# Patient Record
Sex: Male | Born: 1965 | Race: Black or African American | Hispanic: No | Smoking: Never smoker
Health system: Southern US, Community
[De-identification: ages and names within clinical notes are randomized; demographics above are authoritative.]

## PROBLEM LIST (undated history)

## (undated) DIAGNOSIS — F32A Depression, unspecified: Secondary | ICD-10-CM

## (undated) DIAGNOSIS — M1990 Unspecified osteoarthritis, unspecified site: Secondary | ICD-10-CM

## (undated) DIAGNOSIS — E785 Hyperlipidemia, unspecified: Secondary | ICD-10-CM

## (undated) DIAGNOSIS — F0390 Unspecified dementia without behavioral disturbance: Secondary | ICD-10-CM

## (undated) DIAGNOSIS — I469 Cardiac arrest, cause unspecified: Secondary | ICD-10-CM

## (undated) DIAGNOSIS — I639 Cerebral infarction, unspecified: Secondary | ICD-10-CM

## (undated) DIAGNOSIS — M24542 Contracture, left hand: Secondary | ICD-10-CM

## (undated) DIAGNOSIS — I10 Essential (primary) hypertension: Secondary | ICD-10-CM

## (undated) DIAGNOSIS — F0150 Vascular dementia without behavioral disturbance: Secondary | ICD-10-CM

## (undated) DIAGNOSIS — F329 Major depressive disorder, single episode, unspecified: Secondary | ICD-10-CM

## (undated) DIAGNOSIS — R252 Cramp and spasm: Secondary | ICD-10-CM

## (undated) HISTORY — DX: Hyperlipidemia, unspecified: E78.5

## (undated) HISTORY — DX: Cardiac arrest, cause unspecified: I46.9

## (undated) HISTORY — DX: Depression, unspecified: F32.A

## (undated) HISTORY — DX: Cramp and spasm: R25.2

## (undated) HISTORY — DX: Unspecified osteoarthritis, unspecified site: M19.90

## (undated) HISTORY — PX: HERNIA REPAIR: SHX51

## (undated) HISTORY — DX: Vascular dementia without behavioral disturbance: F01.50

## (undated) HISTORY — DX: Major depressive disorder, single episode, unspecified: F32.9

## (undated) HISTORY — DX: Essential (primary) hypertension: I10

## (undated) HISTORY — DX: Contracture, left hand: M24.542

## (undated) HISTORY — DX: Cerebral infarction, unspecified: I63.9

---

## 1997-12-03 ENCOUNTER — Ambulatory Visit (HOSPITAL_COMMUNITY): Admission: RE | Admit: 1997-12-03 | Discharge: 1997-12-03 | Payer: Self-pay | Admitting: Family Medicine

## 1998-03-20 ENCOUNTER — Emergency Department (HOSPITAL_COMMUNITY): Admission: EM | Admit: 1998-03-20 | Discharge: 1998-03-20 | Payer: Self-pay | Admitting: Emergency Medicine

## 1998-08-14 ENCOUNTER — Encounter: Admission: RE | Admit: 1998-08-14 | Discharge: 1998-09-05 | Payer: Self-pay | Admitting: *Deleted

## 1999-05-29 ENCOUNTER — Emergency Department (HOSPITAL_COMMUNITY): Admission: EM | Admit: 1999-05-29 | Discharge: 1999-05-29 | Payer: Self-pay | Admitting: Emergency Medicine

## 1999-06-03 ENCOUNTER — Encounter: Admission: RE | Admit: 1999-06-03 | Discharge: 1999-06-03 | Payer: Self-pay | Admitting: Internal Medicine

## 2000-03-12 ENCOUNTER — Emergency Department (HOSPITAL_COMMUNITY): Admission: EM | Admit: 2000-03-12 | Discharge: 2000-03-12 | Payer: Self-pay | Admitting: Emergency Medicine

## 2000-11-12 ENCOUNTER — Emergency Department (HOSPITAL_COMMUNITY): Admission: EM | Admit: 2000-11-12 | Discharge: 2000-11-12 | Payer: Self-pay | Admitting: Emergency Medicine

## 2001-09-21 ENCOUNTER — Emergency Department (HOSPITAL_COMMUNITY): Admission: EM | Admit: 2001-09-21 | Discharge: 2001-09-21 | Payer: Self-pay | Admitting: Emergency Medicine

## 2001-10-31 ENCOUNTER — Emergency Department (HOSPITAL_COMMUNITY): Admission: EM | Admit: 2001-10-31 | Discharge: 2001-10-31 | Payer: Self-pay | Admitting: *Deleted

## 2001-11-08 ENCOUNTER — Encounter: Admission: RE | Admit: 2001-11-08 | Discharge: 2001-11-08 | Payer: Self-pay | Admitting: Internal Medicine

## 2001-11-22 ENCOUNTER — Encounter: Admission: RE | Admit: 2001-11-22 | Discharge: 2001-11-22 | Payer: Self-pay | Admitting: Internal Medicine

## 2002-05-02 ENCOUNTER — Encounter: Admission: RE | Admit: 2002-05-02 | Discharge: 2002-05-02 | Payer: Self-pay | Admitting: Internal Medicine

## 2002-08-02 ENCOUNTER — Ambulatory Visit (HOSPITAL_COMMUNITY): Admission: RE | Admit: 2002-08-02 | Discharge: 2002-08-02 | Payer: Self-pay | Admitting: Internal Medicine

## 2002-08-02 ENCOUNTER — Encounter: Admission: RE | Admit: 2002-08-02 | Discharge: 2002-08-02 | Payer: Self-pay | Admitting: Internal Medicine

## 2002-08-08 ENCOUNTER — Encounter: Admission: RE | Admit: 2002-08-08 | Discharge: 2002-08-08 | Payer: Self-pay | Admitting: Internal Medicine

## 2002-09-23 ENCOUNTER — Emergency Department (HOSPITAL_COMMUNITY): Admission: EM | Admit: 2002-09-23 | Discharge: 2002-09-23 | Payer: Self-pay | Admitting: Emergency Medicine

## 2002-09-28 ENCOUNTER — Encounter: Payer: Self-pay | Admitting: *Deleted

## 2002-09-28 ENCOUNTER — Emergency Department (HOSPITAL_COMMUNITY): Admission: EM | Admit: 2002-09-28 | Discharge: 2002-09-28 | Payer: Self-pay | Admitting: Emergency Medicine

## 2002-11-23 ENCOUNTER — Encounter: Admission: RE | Admit: 2002-11-23 | Discharge: 2002-11-23 | Payer: Self-pay | Admitting: Internal Medicine

## 2002-12-18 ENCOUNTER — Encounter: Admission: RE | Admit: 2002-12-18 | Discharge: 2002-12-18 | Payer: Self-pay | Admitting: Internal Medicine

## 2003-01-17 ENCOUNTER — Encounter: Admission: RE | Admit: 2003-01-17 | Discharge: 2003-01-17 | Payer: Self-pay | Admitting: Internal Medicine

## 2003-02-06 ENCOUNTER — Emergency Department (HOSPITAL_COMMUNITY): Admission: AD | Admit: 2003-02-06 | Discharge: 2003-02-06 | Payer: Self-pay | Admitting: Family Medicine

## 2003-02-08 ENCOUNTER — Emergency Department (HOSPITAL_COMMUNITY): Admission: AD | Admit: 2003-02-08 | Discharge: 2003-02-08 | Payer: Self-pay | Admitting: Family Medicine

## 2003-02-16 ENCOUNTER — Encounter: Admission: RE | Admit: 2003-02-16 | Discharge: 2003-02-16 | Payer: Self-pay | Admitting: Internal Medicine

## 2003-02-17 ENCOUNTER — Ambulatory Visit (HOSPITAL_COMMUNITY): Admission: RE | Admit: 2003-02-17 | Discharge: 2003-02-17 | Payer: Self-pay | Admitting: Internal Medicine

## 2003-03-15 ENCOUNTER — Encounter: Admission: RE | Admit: 2003-03-15 | Discharge: 2003-03-15 | Payer: Self-pay | Admitting: Internal Medicine

## 2003-04-11 ENCOUNTER — Encounter: Admission: RE | Admit: 2003-04-11 | Discharge: 2003-04-11 | Payer: Self-pay | Admitting: Internal Medicine

## 2003-04-23 ENCOUNTER — Encounter: Admission: RE | Admit: 2003-04-23 | Discharge: 2003-04-23 | Payer: Self-pay | Admitting: Neurosurgery

## 2003-04-25 ENCOUNTER — Encounter: Admission: RE | Admit: 2003-04-25 | Discharge: 2003-04-25 | Payer: Self-pay | Admitting: Internal Medicine

## 2003-05-08 ENCOUNTER — Encounter: Admission: RE | Admit: 2003-05-08 | Discharge: 2003-05-08 | Payer: Self-pay | Admitting: Neurosurgery

## 2004-10-23 ENCOUNTER — Ambulatory Visit: Payer: Self-pay | Admitting: Internal Medicine

## 2004-10-23 ENCOUNTER — Emergency Department (HOSPITAL_COMMUNITY): Admission: EM | Admit: 2004-10-23 | Discharge: 2004-10-23 | Payer: Self-pay | Admitting: Family Medicine

## 2006-05-12 ENCOUNTER — Telehealth (INDEPENDENT_AMBULATORY_CARE_PROVIDER_SITE_OTHER): Payer: Self-pay | Admitting: *Deleted

## 2007-09-05 ENCOUNTER — Emergency Department (HOSPITAL_COMMUNITY): Admission: EM | Admit: 2007-09-05 | Discharge: 2007-09-05 | Payer: Self-pay | Admitting: Family Medicine

## 2007-12-15 ENCOUNTER — Emergency Department (HOSPITAL_COMMUNITY): Admission: EM | Admit: 2007-12-15 | Discharge: 2007-12-15 | Payer: Self-pay | Admitting: Emergency Medicine

## 2010-12-04 LAB — POCT I-STAT, CHEM 8
BUN: 9
Calcium, Ion: 1.09 — ABNORMAL LOW
Chloride: 104
Creatinine, Ser: 1.6 — ABNORMAL HIGH
Glucose, Bld: 92
HCT: 46
Hemoglobin: 15.6
Potassium: 3.5
Sodium: 139
TCO2: 27

## 2016-07-01 HISTORY — PX: PEG TUBE PLACEMENT: SUR1034

## 2016-10-26 ENCOUNTER — Encounter: Payer: Self-pay | Admitting: Internal Medicine

## 2016-10-26 LAB — BASIC METABOLIC PANEL
BUN: 17 (ref 4–21)
Creatinine: 0.7 (ref 0.6–1.3)
Glucose: 100
Potassium: 4.4 (ref 3.4–5.3)
Sodium: 141 (ref 137–147)

## 2016-10-26 LAB — CBC AND DIFFERENTIAL
HCT: 42 (ref 41–53)
Hemoglobin: 13.5 (ref 13.5–17.5)
Neutrophils Absolute: 3
Platelets: 166 (ref 150–399)
WBC: 4.6

## 2016-10-26 LAB — HEMOGLOBIN A1C: Hemoglobin A1C: 4.5

## 2016-10-26 LAB — HEPATIC FUNCTION PANEL
ALT: 17 (ref 10–40)
AST: 10 — AB (ref 14–40)
Alkaline Phosphatase: 51 (ref 25–125)
Bilirubin, Total: 0.4

## 2016-10-26 NOTE — Progress Notes (Signed)
Patient ID: Jeremy Brennan, male   DOB: 1965/08/21, 51 y.o.   MRN: [id number]     HISTORY AND PHYSICAL   DATE:  October 26, 2016  Location:   [facility] Room Number: 6 D Place of Service: SNF (31)   Extended Emergency Contact Information Primary Emergency Contact: Fass,CYNTHIA Address: [address] [place] Home Phone: [phone] Relation: None  Advanced Directive information Does Patient Have a Medical Advance Directive?: No, Would patient like information on creating a medical advance directive?: No - Patient declined  Chief Complaint  Patient presents with  . New Admit To SNF    Admission - CVA, Dysphagia    HPI:  51 yo male seen today as a new admission into SNF from home.    History reviewed. No pertinent past medical history.  History reviewed. No pertinent surgical history.  No care team member to display  Social History   Social History  . Marital status: Married    Spouse name: N/A  . Number of children: N/A  . Years of education: N/A   Occupational History  . Not on file.   Social History Main Topics  . Smoking status: Unknown If Ever Smoked  . Smokeless tobacco: Never Used  . Alcohol use Not on file  . Drug use: Unknown  . Sexual activity: Not on file   Other Topics Concern  . Not on file   Social History Narrative  . No narrative on file     has an unknown smoking status. He has never used smokeless tobacco. His alcohol and drug histories are not on file.  History reviewed. No pertinent family history. No family status information on file.     There is no immunization history on file for this patient.  No Known Allergies  Medications: Patient's Medications  New Prescriptions   No medications on file  Previous Medications   ACETAMINOPHEN (TYLENOL) 325 MG TABLET    Place 650 mg into feeding tube every 4 (four) hours as needed for mild pain (for fever greater than 100).   AMLODIPINE  (NORVASC) 10 MG TABLET    Place 10 mg into feeding tube daily.   ASPIRIN 325 MG TABLET    Place 325 mg into feeding tube daily.   ATORVASTATIN (LIPITOR) 10 MG TABLET    Give 2 tablets via PEG-Tube daily at bedtime   DOCUSATE SODIUM 100 MG CAPSULE    Place 100 mg into feeding tube 2 (two) times daily.   FLUOXETINE (PROZAC) 20 MG CAPSULE    Place 20 mg into feeding tube daily.   HYDRALAZINE (APRESOLINE) 25 MG TABLET    Place 25 mg into feeding tube 2 (two) times daily.   METOPROLOL TARTRATE (LOPRESSOR) 25 MG TABLET    Place 25 mg into feeding tube 2 (two) times daily.   MOXIFLOXACIN (VIGAMOX) 0.5 % OPHTHALMIC SOLUTION    Place 1 drop into both eyes 3 (three) times daily.   MUPIROCIN OINTMENT (BACTROBAN) 2 %    Apply to affected areas topically every day shift for skin infection   NUTRITIONAL SUPPLEMENTS (NUTRITIONAL SUPPLEMENT PO)    HSG Puree Diet - HSG Puree texture, regular consistency, one can of glucerna 1.5 if consumes less than 50%   RISPERIDONE (RISPERDAL) 1 MG TABLET    Place 1 mg into feeding tube at bedtime.  Modified Medications   No medications on file  Discontinued Medications   No medications  on file    Review of Systems  Vitals:   10/26/16 0944  BP: 140/86  Pulse: 74  Temp: (!) 97 F (36.1 C)   There is no height or weight on file to calculate BMI.  Physical Exam   Labs reviewed: No visits with results within 3 Month(s) from this visit.  Latest known visit with results is:  Hospital Outpatient Visit on 09/05/2007  Component Date Value Ref Range Status  . Sodium 09/05/2007 139   Final  . Potassium 09/05/2007 3.5   Final  . Chloride 09/05/2007 104   Final  . BUN 09/05/2007 9   Final  . Creatinine, Ser 09/05/2007 1.6*  Final  . Glucose, Bld 09/05/2007 92   Final  . Calcium, Ion 09/05/2007 1.09*  Final  . TCO2 09/05/2007 27   Final  . Hemoglobin 09/05/2007 15.6   Final  . HCT 09/05/2007 46.0   Final    No results found.   Assessment/Plan    Scherrie Seneca S.  Ancil Linsey  [facility] [address] [place], [place] [address] [phone] Cell (Monday-Friday 8 AM - 5 PM) [phone] After 5 PM and follow prompts

## 2016-10-26 NOTE — Progress Notes (Signed)
This encounter was created in error - please disregard.

## 2016-10-29 ENCOUNTER — Non-Acute Institutional Stay (SKILLED_NURSING_FACILITY): Payer: Medicaid Other | Admitting: Internal Medicine

## 2016-10-29 ENCOUNTER — Encounter: Payer: Self-pay | Admitting: Internal Medicine

## 2016-10-29 DIAGNOSIS — E782 Mixed hyperlipidemia: Secondary | ICD-10-CM

## 2016-10-29 DIAGNOSIS — I69351 Hemiplegia and hemiparesis following cerebral infarction affecting right dominant side: Secondary | ICD-10-CM

## 2016-10-29 DIAGNOSIS — I10 Essential (primary) hypertension: Secondary | ICD-10-CM | POA: Diagnosis not present

## 2016-10-29 DIAGNOSIS — Z8673 Personal history of transient ischemic attack (TIA), and cerebral infarction without residual deficits: Secondary | ICD-10-CM | POA: Diagnosis not present

## 2016-10-29 DIAGNOSIS — M24542 Contracture, left hand: Secondary | ICD-10-CM

## 2016-10-29 DIAGNOSIS — R1310 Dysphagia, unspecified: Secondary | ICD-10-CM

## 2016-10-29 DIAGNOSIS — F339 Major depressive disorder, recurrent, unspecified: Secondary | ICD-10-CM

## 2016-10-29 DIAGNOSIS — Z931 Gastrostomy status: Secondary | ICD-10-CM | POA: Diagnosis not present

## 2016-10-29 DIAGNOSIS — R471 Dysarthria and anarthria: Secondary | ICD-10-CM

## 2016-10-29 NOTE — Progress Notes (Signed)
Patient ID: Jeremy Brennan, male   DOB: 09-Jan-1966, 51 y.o.   MRN: [id number]     HISTORY AND PHYSICAL   DATE:   October 29, 2016  Location:   [facility] Room Number: 84 D Place of Service: SNF (31)   Extended Emergency Contact Information Primary Emergency Contact: Gibby,CYNTHIA Address: [address] [place] Home Phone: [phone] Relation: None  Advanced Directive information Does Patient Have a Medical Advance Directive?: No, Would patient like information on creating a medical advance directive?: No - Patient declined  Chief Complaint  Patient presents with  . New Admit To SNF    Admission    HPI:  51 yo male seen today as a new admission into SNF following prolonged hospital stay at [facility] for acute CVA with dysphagia, depression, HTN, hyperlipidemia. He presented to the ED with SI. Psych was consulted in the ED and reported that he was more confused then usual. 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 Neurology consulted. 2D echo showed EF 55%. He continued to have confusion. Repeat CT had showed continued evolutionary changes of left PCA distribution infarct but no new process. He failed swallowing study  And Peg tube inserted 07/01/16. Labs in April 2018 revealed Hgb 13; plts 158K; Na 141; K 3.7; Cr 1.54. He presents to SNF for short term rehab with potential for long term care.  Today he reports no concerns. He has been eating >50% of meals and has not req'd use of Peg tube. No choking. He loses his voice easily. ST is working with him on that. Prior to hospital admission, he was independent and working a FT job. His legal guardian is at bedside. Pt is a poor historian due to aphasia and dysarthria. He rec'd inpt rehab prior to d/c. Hx obtained from chart and guardian. Labs at SNF revealed WBC 4.6K; Hgb 13.5; plts 166K; Na 141; K 4.4; HCO3 level 28; Cr 0.72; glucose  100; A1c 5.5%  Depression - followed by psych as o/p. Mood stable on prozac and risperdal  HTN - stable on hydralazine, amlodipine and metoprolol tartrate. He takes ASA daily for anticoagulation  Hyperlipidemia - stable on lipitor. No myalgias  Lumbar DDD - pain stable on prn tylenol  Conjunctivitis - stable on abx eye gtts  Past Medical History:  Diagnosis Date  . Arthritis   . Contracture, left hand   . Depression   . Hyperlipidemia   . Hypertension   . Stroke [facility])     Past Surgical History:  Procedure Laterality Date  . HERNIA REPAIR    . PEG TUBE PLACEMENT  07/01/2016    No care team member to display  Social History   Social History  . Marital status: Married    Spouse name: N/A  . Number of children: N/A  . Years of education: N/A   Occupational History  . Not on file.   Social History Main Topics  . Smoking status: Never Smoker  . Smokeless tobacco: Never Used  . Alcohol use Not on file  . Drug use: No  . Sexual activity: Not on file   Other Topics Concern  . Not on file   Social History Narrative  . No narrative on file     reports that he has never smoked. He has never used smokeless tobacco. He reports that he does not use drugs. His  alcohol history is not on file.  Family History  Problem Relation Age of Onset  . Hypertension Other    Family Status  Relation Status  . Other (Not Specified)    Immunization History  Administered Date(s) Administered  . Pneumococcal-Unspecified 10/26/2016    No Known Allergies  Medications: Patient's Medications  New Prescriptions   No medications on file  Previous Medications   ACETAMINOPHEN (TYLENOL) 325 MG TABLET    Place 650 mg into feeding tube every 4 (four) hours as needed for mild pain (for fever greater than 100).   AMLODIPINE (NORVASC) 10 MG TABLET    Place 10 mg into feeding tube daily.   ASPIRIN 325 MG TABLET    Place 325 mg into feeding tube daily.   ATORVASTATIN (LIPITOR) 10 MG  TABLET    Give 2 tablets via PEG-Tube daily at bedtime   DOCUSATE SODIUM 100 MG CAPSULE    Place 100 mg into feeding tube daily as needed.    FLUOXETINE (PROZAC) 20 MG CAPSULE    Place 20 mg into feeding tube daily.   HYDRALAZINE (APRESOLINE) 25 MG TABLET    Place 25 mg into feeding tube 2 (two) times daily.   METOPROLOL TARTRATE (LOPRESSOR) 25 MG TABLET    Place 25 mg into feeding tube 2 (two) times daily.   MOXIFLOXACIN (VIGAMOX) 0.5 % OPHTHALMIC SOLUTION    Place 1 drop into both eyes 3 (three) times daily.   MUPIROCIN OINTMENT (BACTROBAN) 2 %    Apply to affected areas topically every day shift for skin infection   NUTRITIONAL SUPPLEMENTS (NUTRITIONAL SUPPLEMENT PO)    HSG Puree Diet - HSG Puree texture, regular consistency, one can of glucerna 1.5 if consumes less than 50%   RISPERIDONE (RISPERDAL) 1 MG TABLET    Place 1 mg into feeding tube at bedtime.  Modified Medications   No medications on file  Discontinued Medications   No medications on file    Review of Systems  Unable to perform ROS: Other (dysrathria, confusion, memory loss)    Vitals:   10/29/16 1015  BP: 138/78  Pulse: 78  Resp: 20  Temp: 97.8 F (36.6 C)  SpO2: 96%  Weight: 185 lb (83.9 kg)  Height: 6' (1.829 m)   Body mass index is 25.09 kg/m.  Physical Exam  Constitutional: He appears well-developed.  Sitting up in bed in NAD, frail appearing. Guardian at bedside; voice is soft and difficult to understand at times  HENT:  Mouth/Throat: Oropharynx is clear and moist.  MMM; no oral thrush  Eyes: Pupils are equal, round, and reactive to light. No scleral icterus.  Neck: Neck supple. Carotid bruit is not present. No thyromegaly present.  Cardiovascular: Normal rate, regular rhythm and intact distal pulses.  Exam reveals no gallop and no friction rub.   Murmur (1/6 SEM) heard. No distal LE edema. No calf TTP  Pulmonary/Chest: Effort normal and breath sounds normal. He has no wheezes. He has no rales. He  exhibits no tenderness.  Poor inspiratory effort  Abdominal: Soft. Normal appearance and bowel sounds are normal. He exhibits no distension, no abdominal bruit, no pulsatile midline mass and no mass. There is no hepatomegaly. There is no tenderness. There is no rigidity, no rebound and no guarding. No hernia.  Peg tube clamped with no redness/ d/c at insertion site  Musculoskeletal: He exhibits edema and deformity (left hand contracture).  Lymphadenopathy:    He has no cervical adenopathy.  Neurological: He is alert.  Right hemiparesis with 2/5 strength  Skin: Skin is warm and dry. No rash noted.  Psychiatric: He has a normal mood and affect. His behavior is normal.     Labs reviewed: Abstract on 10/29/2016  Component Date Value Ref Range Status  . Hemoglobin 10/26/2016 13.5  13.5 - 17.5 Final  . HCT 10/26/2016 42  41 - 53 Final  . Neutrophils Absolute 10/26/2016 3   Final  . Platelets 10/26/2016 166  150 - 399 Final  . WBC 10/26/2016 4.6   Final  . Glucose 10/26/2016 100   Final  . BUN 10/26/2016 17  4 - 21 Final  . Creatinine 10/26/2016 0.7  0.6 - 1.3 Final  . Potassium 10/26/2016 4.4  3.4 - 5.3 Final  . Sodium 10/26/2016 141  137 - 147 Final  . Alkaline Phosphatase 10/26/2016 51  25 - 125 Final  . ALT 10/26/2016 17  10 - 40 Final  . AST 10/26/2016 10* 14 - 40 Final  . Bilirubin, Total 10/26/2016 0.4   Final  . Hemoglobin A1C 10/26/2016 4.5   Final    No results found.   Assessment/Plan   ICD-10-CM   1. Hemiparesis affecting right side as late effect of stroke (HCC) I69.351   2. 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 History of CVA (cerebrovascular accident) Z86.73   4. Essential hypertension I10   5. Depression, recurrent (HCC) F33.9   6. Mixed hyperlipidemia E78.2   7. Dysphagia, unspecified type R13.10   8. S/P percutaneous endoscopic gastrostomy (PEG) tube placement (HCC) Z93.1   9. Dysarthria R47.1    Encouraged to take breaths several times per hour to  prevent atelectasis  He has not req'd TF as he is consuming >50% of meals  Cont PT/OT/ST as ordered  Cont current meds as ordered  F/u with specialists as indicated  GOAL: short term rehab with potential for long term care. Communicated with pt and nursing.  Will follow  TIME SPENT: 40 MINUTES with >50% time coordinating care  Ionia S. Ancil Linsey  [facility] and Adult Medicine [address] [place], [place] [address] [phone] Cell (Monday-Friday 8 AM - 5 PM) [phone] After 5 PM and follow prompts

## 2016-11-01 ENCOUNTER — Encounter: Payer: Self-pay | Admitting: Internal Medicine

## 2016-11-01 DIAGNOSIS — M24542 Contracture, left hand: Secondary | ICD-10-CM | POA: Insufficient documentation

## 2016-11-01 DIAGNOSIS — I69391 Dysphagia following cerebral infarction: Secondary | ICD-10-CM | POA: Insufficient documentation

## 2016-11-01 DIAGNOSIS — R471 Dysarthria and anarthria: Secondary | ICD-10-CM | POA: Insufficient documentation

## 2016-11-01 DIAGNOSIS — Z931 Gastrostomy status: Secondary | ICD-10-CM | POA: Insufficient documentation

## 2016-11-01 DIAGNOSIS — E782 Mixed hyperlipidemia: Secondary | ICD-10-CM | POA: Insufficient documentation

## 2016-11-01 DIAGNOSIS — F339 Major depressive disorder, recurrent, unspecified: Secondary | ICD-10-CM | POA: Insufficient documentation

## 2016-11-01 DIAGNOSIS — I10 Essential (primary) hypertension: Secondary | ICD-10-CM | POA: Insufficient documentation

## 2016-11-01 DIAGNOSIS — I69351 Hemiplegia and hemiparesis following cerebral infarction affecting right dominant side: Secondary | ICD-10-CM | POA: Insufficient documentation

## 2016-11-01 DIAGNOSIS — Z8673 Personal history of transient ischemic attack (TIA), and cerebral infarction without residual deficits: Secondary | ICD-10-CM | POA: Insufficient documentation

## 2016-11-05 LAB — BASIC METABOLIC PANEL
BUN: 9 (ref 4–21)
Creatinine: 0.7 (ref 0.6–1.3)
Glucose: 85
Potassium: 4.2 (ref 3.4–5.3)
Sodium: 138 (ref 137–147)

## 2016-11-24 ENCOUNTER — Encounter: Payer: Self-pay | Admitting: Adult Health

## 2016-11-24 ENCOUNTER — Non-Acute Institutional Stay (SKILLED_NURSING_FACILITY): Payer: Medicaid Other | Admitting: Adult Health

## 2016-11-24 DIAGNOSIS — F329 Major depressive disorder, single episode, unspecified: Secondary | ICD-10-CM

## 2016-11-24 DIAGNOSIS — R1310 Dysphagia, unspecified: Secondary | ICD-10-CM | POA: Diagnosis not present

## 2016-11-24 DIAGNOSIS — I69351 Hemiplegia and hemiparesis following cerebral infarction affecting right dominant side: Secondary | ICD-10-CM | POA: Diagnosis not present

## 2016-11-24 DIAGNOSIS — E782 Mixed hyperlipidemia: Secondary | ICD-10-CM

## 2016-11-24 DIAGNOSIS — Z931 Gastrostomy status: Secondary | ICD-10-CM

## 2016-11-24 DIAGNOSIS — R471 Dysarthria and anarthria: Secondary | ICD-10-CM

## 2016-11-24 DIAGNOSIS — I6930 Unspecified sequelae of cerebral infarction: Secondary | ICD-10-CM

## 2016-11-24 DIAGNOSIS — I10 Essential (primary) hypertension: Secondary | ICD-10-CM | POA: Diagnosis not present

## 2016-11-24 DIAGNOSIS — R45851 Suicidal ideations: Secondary | ICD-10-CM

## 2016-11-24 NOTE — Progress Notes (Signed)
Location:   [facility] Room Number: 107 D Place of Service:  SNF (31)   CODE STATUS: Full code  No Known Allergies  Chief Complaint  Patient presents with  . Medical Management of Chronic Issues    Cva; hypertension; dyslipidemia; depression; dysarthria; dysphagia     HPI:  He is a 51 year old rehab patient of this facility being seen for the management of his chronic illnesses: cva; hypertension; dyslipidemia; depression; dysarthria; dysphgia. He is unable to fully participate in the hpi or ros; but did tell me that he is feeling good.  There are no report of behavioral issues; he does have a good appetite; no indications of depression present. There are no nursing concerns at this time.   Past Medical History:  Diagnosis Date  . Arthritis   . Contracture, left hand   . Depression   . Hyperlipidemia   . Hypertension   . Stroke [facility])     Past Surgical History:  Procedure Laterality Date  . HERNIA REPAIR    . PEG TUBE PLACEMENT  07/01/2016    Social History   Social History  . Marital status: Married    Spouse name: N/A  . Number of children: N/A  . Years of education: N/A   Occupational History  . Not on file.   Social History Main Topics  . Smoking status: Never Smoker  . Smokeless tobacco: Never Used  . Alcohol use Not on file  . Drug use: No  . Sexual activity: Not on file   Other Topics Concern  . Not on file   Social History Narrative  . No narrative on file   Family History  Problem Relation Age of Onset  . Hypertension Other       VITAL SIGNS BP 132/76   Pulse 74   Temp 98 F (36.7 C)   Resp 16   Ht 6' (1.829 m)   Wt 182 lb 9.6 oz (82.8 kg)   SpO2 98%   BMI 24.77 kg/m   Patient's Medications  New Prescriptions   No medications on file  Previous Medications   ACETAMINOPHEN (TYLENOL) 325 MG TABLET    Place 650 mg into feeding tube every 4 (four) hours as needed for mild pain (for fever greater than 100).   AMLODIPINE (NORVASC) 10 MG TABLET    Place 10 mg into feeding tube daily.   ASPIRIN 325 MG TABLET    Place 325 mg into feeding tube daily.   ATORVASTATIN (LIPITOR) 10 MG TABLET    Give 2 tablets via PEG-Tube daily at bedtime   DOCUSATE SODIUM 100 MG CAPSULE    Place 100 mg into feeding tube daily as needed.    FLUOXETINE (PROZAC) 20 MG CAPSULE    Place 20 mg into feeding tube daily.   HYDRALAZINE (APRESOLINE) 25 MG TABLET    Place 25 mg into feeding tube 2 (two) times daily.   METOPROLOL TARTRATE (LOPRESSOR) 25 MG TABLET    Place 25 mg into feeding tube 2 (two) times daily.   MOXIFLOXACIN (VIGAMOX) 0.5 % OPHTHALMIC SOLUTION    Place 1 drop into both eyes 3 (three) times daily.   MUPIROCIN OINTMENT (BACTROBAN) 2 %    Apply to affected areas topically every day shift for skin infection   NUTRITIONAL SUPPLEMENTS (FEEDING SUPPLEMENT, JEVITY 1.5 CAL,) LIQD    Give 1 can via PEG if less than 50% of meal consumed   NUTRITIONAL SUPPLEMENTS (NUTRITIONAL SUPPLEMENT PO)  HSG Puree Diet - HSG Puree texture, regular consistency   RISPERIDONE (RISPERDAL) 1 MG TABLET    Place 1 mg into feeding tube at bedtime.   TIZANIDINE (ZANAFLEX) 2 MG CAPSULE    Place 2 mg into feeding tube 2 (two) times daily.   UNABLE TO FIND    Flush G-Tube every 6 hours with 200 ml of walter for hydration and tube patency.  Flush with 5-109ml water between each medication  Modified Medications   No medications on file  Discontinued Medications   No medications on file     SIGNIFICANT DIAGNOSTIC EXAMS  NONE RECENT  LABS REVIEWED; TODAY:  10-26-16: wbc 4.6; hgb 13.5; hct 41.7; mcv 91.8; plt 166 glucose 100; bun 17.2; creat 0.72; k+ 4.4 ;na++ 141; liver normal albumin 3.7 hgb a1c 4.5  11-05-16: glucose 85; bun 9.3; creat 0.66; k+ 4.2; na++ 138; ca 9.7   Review of Systems  Unable to perform ROS: Other (dysarthria )    Physical Exam  Constitutional: He appears well-nourished. No distress.  HENT:  Head: Normocephalic.    Eyes: Conjunctivae are normal.  Neck: Neck supple. No thyromegaly present.  Cardiovascular: Normal rate, regular rhythm, normal heart sounds and intact distal pulses.   Pulmonary/Chest: Effort normal and breath sounds normal. No respiratory distress.  Abdominal: Soft. Bowel sounds are normal. He exhibits no distension. There is no tenderness.  Has peg tube present without signs of infection present.   Musculoskeletal: He exhibits no edema.  Right side hemiparesis   Lymphadenopathy:    He has no cervical adenopathy.  Neurological: He is alert.  Skin: Skin is warm and dry. He is not diaphoretic.  Psychiatric: He has a normal mood and affect.    ASSESSMENT/ PLAN:  TODAY:   1. CVA: has right side hemiparesis: is without change in his status: will continue asa 325 mg daily and will continue zanaflex 2 mg twice daily for spasticity   2. Hypertension:  Stable b/p: 132/76: will continue norvasc 10 mg daily; apresoline 25 mg twice daily and lopressor 25 mg twice daily   3. Dyslipidemia: no change in status: will continue lipitor 10 mg daily  4. Dysphagia: is stable no signs of aspiration present: will continue pureed diet with thin liquids; doe have a peg tube and is currently not being used.   5.  Recurrent depression with suicidal ideation: is presently stable will continue prozac 20 mg daily and does take risperdal 1 mg nightly     MD is aware of resident's narcotic use and is in agreement with current plan of care. We will attempt to wean resident as apropriate   Synthia Innocent NP [facility] Adult [facility]  Contact [phone] Monday through Friday 8am- 5pm  After hours call [phone]

## 2016-12-01 ENCOUNTER — Encounter (HOSPITAL_COMMUNITY): Payer: Self-pay | Admitting: Family Medicine

## 2016-12-01 ENCOUNTER — Emergency Department (HOSPITAL_COMMUNITY): Payer: Medicaid Other

## 2016-12-01 ENCOUNTER — Inpatient Hospital Stay (HOSPITAL_COMMUNITY)
Admission: EM | Admit: 2016-12-01 | Discharge: 2016-12-03 | DRG: 308 | Disposition: A | Discharge status: Skilled Nursing Facility | Payer: Medicaid Other | Attending: Internal Medicine | Admitting: Internal Medicine

## 2016-12-01 ENCOUNTER — Encounter: Payer: Self-pay | Admitting: Adult Health

## 2016-12-01 ENCOUNTER — Non-Acute Institutional Stay (SKILLED_NURSING_FACILITY): Payer: Medicaid Other | Admitting: Adult Health

## 2016-12-01 ENCOUNTER — Encounter (HOSPITAL_COMMUNITY): Payer: Self-pay | Admitting: Emergency Medicine

## 2016-12-01 DIAGNOSIS — I69351 Hemiplegia and hemiparesis following cerebral infarction affecting right dominant side: Secondary | ICD-10-CM

## 2016-12-01 DIAGNOSIS — F329 Major depressive disorder, single episode, unspecified: Secondary | ICD-10-CM | POA: Diagnosis present

## 2016-12-01 DIAGNOSIS — R4189 Other symptoms and signs involving cognitive functions and awareness: Secondary | ICD-10-CM | POA: Diagnosis present

## 2016-12-01 DIAGNOSIS — I6930 Unspecified sequelae of cerebral infarction: Secondary | ICD-10-CM | POA: Insufficient documentation

## 2016-12-01 DIAGNOSIS — I10 Essential (primary) hypertension: Secondary | ICD-10-CM | POA: Diagnosis present

## 2016-12-01 DIAGNOSIS — Z7982 Long term (current) use of aspirin: Secondary | ICD-10-CM

## 2016-12-01 DIAGNOSIS — R001 Bradycardia, unspecified: Secondary | ICD-10-CM | POA: Diagnosis not present

## 2016-12-01 DIAGNOSIS — D61818 Other pancytopenia: Secondary | ICD-10-CM | POA: Diagnosis present

## 2016-12-01 DIAGNOSIS — I440 Atrioventricular block, first degree: Secondary | ICD-10-CM | POA: Diagnosis present

## 2016-12-01 DIAGNOSIS — Z931 Gastrostomy status: Secondary | ICD-10-CM | POA: Diagnosis not present

## 2016-12-01 DIAGNOSIS — I9589 Other hypotension: Secondary | ICD-10-CM | POA: Diagnosis present

## 2016-12-01 DIAGNOSIS — E785 Hyperlipidemia, unspecified: Secondary | ICD-10-CM | POA: Diagnosis present

## 2016-12-01 DIAGNOSIS — G9341 Metabolic encephalopathy: Secondary | ICD-10-CM | POA: Diagnosis present

## 2016-12-01 DIAGNOSIS — I469 Cardiac arrest, cause unspecified: Secondary | ICD-10-CM

## 2016-12-01 DIAGNOSIS — I69391 Dysphagia following cerebral infarction: Secondary | ICD-10-CM

## 2016-12-01 DIAGNOSIS — F0390 Unspecified dementia without behavioral disturbance: Secondary | ICD-10-CM | POA: Diagnosis present

## 2016-12-01 DIAGNOSIS — R45851 Suicidal ideations: Secondary | ICD-10-CM

## 2016-12-01 DIAGNOSIS — Z79899 Other long term (current) drug therapy: Secondary | ICD-10-CM

## 2016-12-01 DIAGNOSIS — R1310 Dysphagia, unspecified: Secondary | ICD-10-CM | POA: Diagnosis present

## 2016-12-01 HISTORY — DX: Cardiac arrest, cause unspecified: I46.9

## 2016-12-01 HISTORY — DX: Unspecified dementia, unspecified severity, without behavioral disturbance, psychotic disturbance, mood disturbance, and anxiety: F03.90

## 2016-12-01 LAB — TROPONIN I: Troponin I: <0.03 ng/mL (ref <0.03)

## 2016-12-01 LAB — CBC WITH DIFFERENTIAL/PLATELET
BASOS ABS: 0 10*3/uL (ref 0.0–0.1)
BASOS PCT: 0 %
EOS PCT: 3 %
Eosinophils Absolute: 0.1 10*3/uL (ref 0.0–0.7)
HEMATOCRIT: 33.6 % — AB (ref 39.0–52.0)
Hemoglobin: 11.1 g/dL — ABNORMAL LOW (ref 13.0–17.0)
Lymphocytes Relative: 31 %
Lymphs Abs: 0.9 10*3/uL (ref 0.7–4.0)
MCH: 29.1 pg (ref 26.0–34.0)
MCHC: 33 g/dL (ref 30.0–36.0)
MCV: 88.2 fL (ref 78.0–100.0)
MONO ABS: 0.3 10*3/uL (ref 0.1–1.0)
MONOS PCT: 11 %
Neutro Abs: 1.5 10*3/uL — ABNORMAL LOW (ref 1.7–7.7)
Neutrophils Relative %: 55 %
PLATELETS: 131 10*3/uL — AB (ref 150–400)
RBC: 3.81 MIL/uL — ABNORMAL LOW (ref 4.22–5.81)
RDW: 13.8 % (ref 11.5–15.5)
WBC: 2.8 10*3/uL — ABNORMAL LOW (ref 4.0–10.5)

## 2016-12-01 LAB — COMPREHENSIVE METABOLIC PANEL
ALBUMIN: 3.1 g/dL — AB (ref 3.5–5.0)
ALT: 14 U/L — ABNORMAL LOW (ref 17–63)
ANION GAP: 5 (ref 5–15)
AST: 14 U/L — AB (ref 15–41)
Alkaline Phosphatase: 32 U/L — ABNORMAL LOW (ref 38–126)
BILIRUBIN TOTAL: 0.5 mg/dL (ref 0.3–1.2)
BUN: 17 mg/dL (ref 6–20)
CHLORIDE: 106 mmol/L (ref 101–111)
CO2: 27 mmol/L (ref 22–32)
Calcium: 8.9 mg/dL (ref 8.9–10.3)
Creatinine, Ser: 1.04 mg/dL (ref 0.61–1.24)
GFR calc Af Amer: >60 mL/min (ref >60)
GFR calc non Af Amer: >60 mL/min (ref >60)
GLUCOSE: 99 mg/dL (ref 65–99)
POTASSIUM: 3.7 mmol/L (ref 3.5–5.1)
Sodium: 138 mmol/L (ref 135–145)
TOTAL PROTEIN: 5.9 g/dL — AB (ref 6.5–8.1)

## 2016-12-01 LAB — MRSA PCR SCREENING: MRSA BY PCR: NEGATIVE

## 2016-12-01 LAB — I-STAT TROPONIN, ED: TROPONIN I, POC: 0.01 ng/mL (ref 0.00–0.08)

## 2016-12-01 LAB — CBG MONITORING, ED: Glucose-Capillary: 95 mg/dL (ref 65–99)

## 2016-12-01 MED ORDER — ATORVASTATIN CALCIUM 10 MG PO TABS: 10.0000 mg | ORAL_TABLET | Freq: Every day | ORAL | Status: DC

## 2016-12-01 MED ORDER — IOPAMIDOL (ISOVUE-370) INJECTION 76%
INTRAVENOUS | Status: AC
  Administered 2016-12-01: 100 mL
  Filled 2016-12-01: qty 100

## 2016-12-01 MED ORDER — ONDANSETRON HCL 4 MG PO TABS: 4.0000 mg | ORAL_TABLET | Freq: Four times a day (QID) | ORAL | Status: DC | PRN

## 2016-12-01 MED ORDER — TIZANIDINE HCL 2 MG PO TABS
2.0000 mg | ORAL_TABLET | Freq: Two times a day (BID) | ORAL | Status: DC
  Administered 2016-12-01 – 2016-12-03 (×4): 2 mg via ORAL
  Filled 2016-12-01 (×4): qty 1

## 2016-12-01 MED ORDER — SODIUM CHLORIDE 0.9 % IV BOLUS (SEPSIS)
1000.0000 mL | Freq: Once | INTRAVENOUS | Status: AC
  Administered 2016-12-01: 1000 mL via INTRAVENOUS

## 2016-12-01 MED ORDER — SODIUM CHLORIDE 0.45 % IV SOLN
INTRAVENOUS | Status: DC
  Administered 2016-12-01 – 2016-12-02 (×2): via INTRAVENOUS

## 2016-12-01 MED ORDER — HEPARIN SODIUM (PORCINE) 5000 UNIT/ML IJ SOLN
5000.0000 [IU] | Freq: Three times a day (TID) | INTRAMUSCULAR | Status: DC
  Administered 2016-12-01 – 2016-12-03 (×6): 5000 [IU] via SUBCUTANEOUS
  Filled 2016-12-01 (×6): qty 1

## 2016-12-01 MED ORDER — RISPERIDONE 1 MG PO TABS
1.0000 mg | ORAL_TABLET | Freq: Every day | ORAL | Status: DC
  Administered 2016-12-01 – 2016-12-02 (×2): 1 mg
  Filled 2016-12-01 (×2): qty 1

## 2016-12-01 MED ORDER — ONDANSETRON HCL 4 MG/2ML IJ SOLN: 4.0000 mg | Freq: Four times a day (QID) | INTRAMUSCULAR | Status: DC | PRN

## 2016-12-01 MED ORDER — IOPAMIDOL (ISOVUE-370) INJECTION 76%
INTRAVENOUS | Status: AC
  Filled 2016-12-01: qty 100

## 2016-12-01 MED ORDER — SODIUM CHLORIDE 0.9% FLUSH
3.0000 mL | Freq: Two times a day (BID) | INTRAVENOUS | Status: DC
  Administered 2016-12-01 – 2016-12-03 (×2): 3 mL via INTRAVENOUS

## 2016-12-01 MED ORDER — OSMOLITE 1.5 CAL PO LIQD
237.0000 mL | ORAL | Status: DC
  Administered 2016-12-02: 237 mL
  Filled 2016-12-01: qty 1000
  Filled 2016-12-01 (×2): qty 237

## 2016-12-01 MED ORDER — FLUOXETINE HCL 20 MG PO CAPS
20.0000 mg | ORAL_CAPSULE | Freq: Every day | ORAL | Status: DC
  Administered 2016-12-02 – 2016-12-03 (×2): 20 mg
  Filled 2016-12-01 (×2): qty 1

## 2016-12-01 MED ORDER — ASPIRIN 325 MG PO TABS
325.0000 mg | ORAL_TABLET | Freq: Every day | ORAL | Status: DC
  Administered 2016-12-02 – 2016-12-03 (×2): 325 mg
  Filled 2016-12-01 (×2): qty 1

## 2016-12-01 MED ORDER — SODIUM CHLORIDE 0.9 % IV SOLN
INTRAVENOUS | Status: DC
  Administered 2016-12-01 – 2016-12-02 (×2): via INTRAVENOUS

## 2016-12-01 NOTE — Progress Notes (Addendum)
Appears to be syncopal event/ possible arrest in 51 y/o AAM w/prior stroke, nursing home resident. Could be multifactorial. Stable cardiac exam. He is back to baseline and hemodynamically stable. EKG with sinus bradycardia and first degree AV block. No other conduction abnormality. Consider admission to hospitalist service. Hold metoprolol, but doubt this was the inciting factor.  Full consult note to follow.  Elder Negus, MD [facility] Pager: [phone] Office: [phone] If no answer Cell [phone]

## 2016-12-01 NOTE — ED Notes (Signed)
Orthostatic vitals not obtained at this time due to pt's weakness. MD aware.

## 2016-12-01 NOTE — ED Notes (Signed)
CBG 95 

## 2016-12-01 NOTE — ED Provider Notes (Addendum)
[facility]-EMERGENCY DEPT Provider Note   CSN: [id number] Arrival date & time: 12/01/16  1202     History   Chief Complaint Chief Complaint  Patient presents with  . Bradycardia    HPI Jeremy Brennan is a 51 y.o. male.  HPI  51 year old male with an extensive past medical history listed below including CVA with right-sided deficits with this in a skilled nursing facility. He presents after being found unresponsive by the staff at the skilled nursing facility. Patient was his normal state of health this morning. Patient was left in his room no more than 10 minutes per staff. When they returned, they found the patient unresponsive and drooling. He did not notice a pulse and began doing CPR for approximately 3 minutes. After which they noted return of spontaneous circulation. EMS was contacted who noted patient's CBG was within normal limits, patient was bradycardic and hypotensive with systolics in the 80s. Her staff at that time, the patient was at his baseline, however when we contacted the skilled nursing facility and spoke with a staff member familiar with the patient, she reported that the patient appears to be more altered than normal. They report that he had not had any other complaints recently. No recent fevers or infections.  Currently the patient has no acute complaints denies any chest pain, shortness of breath, abdominal pain, nausea, headache.  Past Medical History:  Diagnosis Date  . Arthritis   . Contracture, left hand   . Depression   . Hyperlipidemia   . Hypertension   . Stroke [facility])     Patient Active Problem List   Diagnosis Date Noted  . Chronic cerebrovascular accident (CVA) 12/01/2016  . Depression with suicidal ideation 12/01/2016  . Sudden cardiac arrest (HCC) 12/01/2016  . Hemiparesis affecting right side as late effect of stroke (HCC) 11/01/2016  . Contracture of joint of left hand 11/01/2016  . History of CVA (cerebrovascular accident) 11/01/2016  .  Essential hypertension 11/01/2016  . Depression, recurrent (HCC) 11/01/2016  . Mixed hyperlipidemia 11/01/2016  . Dysphagia 11/01/2016  . S/P percutaneous endoscopic gastrostomy (PEG) tube placement (HCC) 11/01/2016  . Dysarthria 11/01/2016    Past Surgical History:  Procedure Laterality Date  . HERNIA REPAIR    . PEG TUBE PLACEMENT  07/01/2016       Home Medications    Prior to Admission medications   Medication Sig Start Date End Date Taking? Authorizing Provider  acetaminophen (TYLENOL) 325 MG tablet Place 650 mg into feeding tube every 4 (four) hours as needed for mild pain (for fever greater than 100).    [provider]  amLODipine (NORVASC) 10 MG tablet Place 10 mg into feeding tube daily.    [provider]  aspirin 325 MG tablet Place 325 mg into feeding tube daily.    [provider]  atorvastatin (LIPITOR) 10 MG tablet Give 2 tablets via PEG-Tube daily at bedtime    [provider]  Docusate Sodium 100 MG capsule Place 100 mg into feeding tube daily as needed.     [provider]  FLUoxetine (PROZAC) 20 MG capsule Place 20 mg into feeding tube daily.    [provider]  hydrALAZINE (APRESOLINE) 25 MG tablet Place 25 mg into feeding tube 2 (two) times daily.    [provider]  metoprolol tartrate (LOPRESSOR) 25 MG tablet Place 25 mg into feeding tube 2 (two) times daily.    [provider]  moxifloxacin (VIGAMOX) 0.5 % ophthalmic solution Place  1 drop into both eyes 3 (three) times daily.    [provider]  mupirocin ointment (BACTROBAN) 2 % Apply to affected areas topically every day shift for skin infection    [provider]  Nutritional Supplements (FEEDING SUPPLEMENT, JEVITY 1.5 CAL,) LIQD Give 1 can via PEG if less than 50% of meal consumed    [provider]  Nutritional Supplements (NUTRITIONAL SUPPLEMENT PO) HSG Regular Diet - HSG Mech Soft Texture, Nectar  Consistency    [provider]  risperiDONE (RISPERDAL) 1 MG tablet Place 1 mg into feeding tube at bedtime.    [provider]  tizanidine (ZANAFLEX) 2 MG capsule Place 2 mg into feeding tube 2 (two) times daily.    [provider]  UNABLE TO FIND Flush G-Tube every 6 hours with 200 ml of walter for hydration and tube patency.  Flush with 5-17ml water between each medication    [provider]    Family History Family History  Problem Relation Age of Onset  . Hypertension Other     Social History Social History  Substance Use Topics  . Smoking status: Never Smoker  . Smokeless tobacco: Never Used  . Alcohol use Not on file     Allergies   Patient has no known allergies.   Review of Systems Review of Systems  Unable to perform ROS: Mental status change  Endocrine: Positive for heat intolerance.     Physical Exam Updated Vital Signs BP 88/58   Pulse (!) 50   Temp (!) 97.3 F (36.3 C) (Oral)   Resp 10   SpO2 100%   Physical Exam  Constitutional: He appears well-developed and well-nourished. No distress.  HENT:  Head: Normocephalic and atraumatic.  Nose: Nose normal.  Eyes: Pupils are equal, round, and reactive to light. Conjunctivae and EOM are normal. Right eye exhibits no discharge. Left eye exhibits no discharge. No scleral icterus.  Neck: Normal range of motion. Neck supple.  Cardiovascular: Regular rhythm.  Bradycardia present.  Exam reveals no gallop and no friction rub.   No murmur heard. Pulmonary/Chest: Effort normal and breath sounds normal. No stridor. No respiratory distress. He has no rales. He exhibits no tenderness.  Abdominal: Soft. He exhibits no distension. There is no tenderness.  Musculoskeletal: He exhibits no edema or tenderness.  Right lower extremity edema  Neurological: He is alert. He is disoriented.  Contractions of the right upper extremity. Not able to follow commands, which limits neurologic  examination. Patient is spoken,  But able to speak clearly. No notable facial droop. Extraocular movements intact.   Skin: Skin is warm and dry. No rash noted. He is not diaphoretic. No erythema.  Psychiatric: He has a normal mood and affect.  Vitals reviewed.    ED Treatments / Results  Labs (all labs ordered are listed, but only abnormal results are displayed) Labs Reviewed  CBC WITH DIFFERENTIAL/PLATELET - Abnormal; Notable for the following:       Result Value   WBC 2.8 (*)    RBC 3.81 (*)    Hemoglobin 11.1 (*)    HCT 33.6 (*)    Platelets 131 (*)    Neutro Abs 1.5 (*)    All other components within normal limits  COMPREHENSIVE METABOLIC PANEL - Abnormal; Notable for the following:    Total Protein 5.9 (*)    Albumin 3.1 (*)    AST 14 (*)    ALT 14 (*)    Alkaline Phosphatase 32 (*)  All other components within normal limits  CBG MONITORING, ED  I-STAT TROPONIN, ED    EKG  EKG Interpretation  Date/Time:  Tuesday December 01 2016 12:09:05 EDT Ventricular Rate:  51 PR Interval:    QRS Duration: 93 QT Interval:  478 QTC Calculation: 441 R Axis:   34 Text Interpretation:  Sinus rhythm Prolonged PR interval RESOLVED T WAVE CHANGES IN V5/V6 SINCE 2009 NO STEMI Otherwise no significant change Confirmed by Drema Pry [phone]) on 12/01/2016 1:56:18 PM       Radiology Ct Head Wo Contrast  Result Date: 12/01/2016 CLINICAL DATA:  51 year old presenting with acute mental status changes, becoming unresponsive at the nursing home and unable to answer questions thereafter personal history of stroke. EXAM: CT HEAD WITHOUT CONTRAST TECHNIQUE: Contiguous axial images were obtained from the base of the skull through the vertex without intravenous contrast. COMPARISON:  None. FINDINGS: Motion blurred several of the images during the examination but the study appears diagnostic. Brain: Head tilt in the gantry accounts for apparent asymmetry. Encephalomalacia involving the left  occipital lobe and the left posterior temporal lobe. Low attenuation involving pons. Ventricular system normal in size and appearance for age. Severe changes of small vessel disease of the white matter diffusely No mass lesion. No midline shift. No acute hemorrhage or hematoma. No extra-axial fluid collections. No evidence of acute infarction. Vascular: Mild bilateral carotid siphon and left vertebral artery atherosclerosis. Atretic right vertebral artery. No hyperdense vessel. Skull: No skull fracture or other focal osseous abnormality involving the skull. Sinuses/Orbits: Visualized paranasal sinuses, bilateral mastoid air cells and bilateral middle ear cavities well-aerated. Visualized orbits and globes are normal. Other: None. IMPRESSION: 1. No acute intracranial abnormality. 2. Remote cortical stroke involving the left occipital lobe and left superior temporal lobe (left posterior cerebral artery distribution). 3. Remote stroke involving the pons. 4. Severe chronic microvascular ischemic changes involving the white matter. Electronically Signed   By: Hulan Saas M.D.   On: 12/01/2016 14:35   Ct Angio Chest Pe W And/or Wo Contrast  Result Date: 12/01/2016 CLINICAL DATA:  High probability of pulmonary embolism EXAM: CT ANGIOGRAPHY CHEST WITH CONTRAST TECHNIQUE: Multidetector CT imaging of the chest was performed using the standard protocol during bolus administration of intravenous contrast. Multiplanar CT image reconstructions and MIPs were obtained to evaluate the vascular anatomy. CONTRAST:  100 cc Isovue 370 COMPARISON:  Chest x-ray of 12/01/2016 FINDINGS: Cardiovascular: The heart is mildly enlarged. No pericardial effusion is seen. No significant coronary artery calcifications are evident. The mid ascending thoracic aorta measures 41 mm in diameter. Recommend annual imaging followup by CTA or MRA. This recommendation follows 2010 ACCF/AHA/AATS/ACR/ASA/SCA/SCAI/SIR/STS/SVM Guidelines for the  Diagnosis and Management of Patients with Thoracic Aortic Disease. Circulation. 2010; 121: U045-W098. The pulmonary artery opacified well. No evidence of acute pulmonary embolism is seen. Linear artifacts do obscure some detail within the distal branches to the lower lobes. Mediastinum/Nodes: There may be a small hiatal hernia present. No mediastinal or hilar adenopathy is seen. The thyroid gland is unremarkable. Lungs/Pleura: On lung window images, no pneumonia is seen and there is no evidence of pleural effusion. No suspicious lung nodule is noted. The central airway is patent. Upper Abdomen: Images through the upper abdomen show no significant abnormality. The stomach is relatively distended with fluid. Musculoskeletal: There are mild degenerative changes throughout the thoracic spine diffusely. No compression deformity is seen. Review of the MIP images confirms the above findings. IMPRESSION: 1. No evidence of acute pulmonary embolism is seen.  2. Fusiform dilatation of the ascending thoracic aorta measuring up to 41 mm in diameter. Recommend annual imaging followup by CTA or MRA. This recommendation follows 2010 ACCF/AHA/AATS/ACR/ASA/SCA/SCAI/SIR/STS/SVM Guidelines for the Diagnosis and Management of Patients with Thoracic Aortic Disease. Circulation. 2010; 121: Z610-R604 3. Possible small hiatal hernia. Electronically Signed   By: Dwyane Dee M.D.   On: 12/01/2016 16:23   Dg Chest Portable 1 View  Result Date: 12/01/2016 CLINICAL DATA:  Status post CVA last month with persistent right-sided weakness. Patient was found unresponsive in the wheelchair this morning without apparent cause. The patient underwent CPR for 3 minutes. EXAM: PORTABLE CHEST 1 VIEW COMPARISON:  None in PACs FINDINGS: The lungs are adequately inflated and clear. The heart is mildly enlarged. The pulmonary vascularity is normal. The mediastinum is normal in width. The bony thorax is unremarkable. External pacemaker defibrillator pads are  present. IMPRESSION: Mild cardiomegaly without pulmonary edema. No acute cardiopulmonary abnormality. Electronically Signed   By: David  [place] M.D.   On: 12/01/2016 12:41    Procedures Procedures (including critical care time) CRITICAL CARE Performed by: Amadeo Garnet Cardama Total critical care time: 45 minutes Critical care time was exclusive of separately billable procedures and treating other patients. Critical care was necessary to treat or prevent imminent or life-threatening deterioration. Critical care was time spent personally by me on the following activities: development of treatment plan with patient and/or surrogate as well as nursing, discussions with consultants, evaluation of patient's response to treatment, examination of patient, obtaining history from patient or surrogate, ordering and performing treatments and interventions, ordering and review of laboratory studies, ordering and review of radiographic studies, pulse oximetry and re-evaluation of patient's condition.   Medications Ordered in ED Medications  sodium chloride 0.9 % bolus 1,000 mL (0 mLs Intravenous Stopped 12/01/16 1324)    And  0.9 %  sodium chloride infusion ( Intravenous Stopped 12/01/16 1651)  iopamidol (ISOVUE-370) 76 % injection (not administered)  iopamidol (ISOVUE-370) 76 % injection (100 mLs  Contrast Given 12/01/16 1357)     Initial Impression / Assessment and Plan / ED Course  I have reviewed the triage vital signs and the nursing notes.  Pertinent labs & imaging results that were available during my care of the patient were reviewed by me and considered in my medical decision making (see chart for details).     Possible cardiac arrest in the setting of bradycardia. On arrival patient was bradycardic and hypotensive. No acute complaints.   EKG with improved lateral T-wave changes. Initial troponin negative. Given the asymmetry in the lower extremity, CTA was obtained which did not reveal  evidence of pulmonary embolism.   Patient was given 2 L IV fluids which improved the patient's blood pressure and tachycardia.   CT head was also obtained which did not reveal any evidence of acute changes. Did note remote prior strokes.  No identifiable sources of infection. Will discuss with cardiology due to possible cardiogenic etiology.  Patient evaluated by Dr. Rosemary Holms who did not recommend aggressive cardiac management in this patient. Requested hospitalist admission.   Final Clinical Impressions(s) / ED Diagnoses   Final diagnoses:  Bradycardia  Other specified hypotension        Cardama, Amadeo Garnet, MD 12/01/16 1727

## 2016-12-01 NOTE — ED Notes (Signed)
Cardiology at bedside.

## 2016-12-01 NOTE — H&P (Signed)
History and Physical    Jeremy Brennan ZOX:[id number] DOB: May 17, 1965 DOA: 12/01/2016  PCP: Kirt Boys, DO Patient coming from: SNF - [facility]  Chief Complaint: unresponsive  HPI: Jeremy Brennan is a 51 y.o. male with medical history significant of CVA with residual deficits, hypertension, hyperlipidemia, depression, dementia.   Level V caveat applies the patient is unable to provide reliable history at this time. History provided on a limited basis by patient and primarily by SNF report, and EDP.  Patient presents by EMS staff after being transported from [facility]. Pt was sent to [facility] from [facility] [address] after suffering and being treated for a stroke. Per report patient had been doing well since his admission to their facility on 10/29/2016. Patient was gaining strength and recently had started to ambulate on his own. There are no recent complaints of fevers, confusion, chest pain, shortness breath, palpitations, cough, abdominal pain, dysuria, frequency, worsening neurological deficits, headache. On day of admission patient was in his normal state of health and doing well. There is a 10 minute interval where patient was not seen and then was found unresponsive. Nursing home staff state that they were amenable to find a pulse and began CPR. CPR lasted for 3 minutes. Patient had  return of pulse he had a systolic pressure in the 80s and a very slow pulse. Patient had a normal glucose level at the time. No further history is able to be obtained.  ED Course: objective findings below  Review of Systems: As per HPI otherwise all other systems reviewed and are negative  Ambulatory Status:restricted due to deficits from CVA  Past Medical History:  Diagnosis Date  . Arthritis   . Contracture, left hand   . 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 Depression   . Hyperlipidemia   . Hypertension   . Stroke [facility])     Past Surgical History:  Procedure Laterality Date   . HERNIA REPAIR    . PEG TUBE PLACEMENT  07/01/2016    Social History   Social History  . Marital status: Married    Spouse name: N/A  . Number of children: N/A  . Years of education: N/A   Occupational History  . Not on file.   Social History Main Topics  . Smoking status: Never Smoker  . Smokeless tobacco: Never Used  . Alcohol use Not on file  . Drug use: No  . Sexual activity: Not on file   Other Topics Concern  . Not on file   Social History Narrative  . No narrative on file    No Known Allergies  Family History  Problem Relation Age of Onset  . Hypertension Other       Prior to Admission medications   Medication Sig Start Date End Date Taking? Authorizing Provider  acetaminophen (TYLENOL) 325 MG tablet Place 650 mg into feeding tube every 4 (four) hours as needed for mild pain (for fever greater than 100).    [provider]  amLODipine (NORVASC) 10 MG tablet Place 10 mg into feeding tube daily.    [provider]  aspirin 325 MG tablet Place 325 mg into feeding tube daily.    [provider]  atorvastatin (LIPITOR) 10 MG tablet Give 2 tablets via PEG-Tube daily at bedtime    [provider]  Docusate Sodium 100 MG capsule Place 100 mg into feeding tube daily as needed.     [provider]  FLUoxetine (  PROZAC) 20 MG capsule Place 20 mg into feeding tube daily.    [provider]  hydrALAZINE (APRESOLINE) 25 MG tablet Place 25 mg into feeding tube 2 (two) times daily.    [provider]  metoprolol tartrate (LOPRESSOR) 25 MG tablet Place 25 mg into feeding tube 2 (two) times daily.    [provider]  moxifloxacin (VIGAMOX) 0.5 % ophthalmic solution Place 1 drop into both eyes 3 (three) times daily.    [provider]  mupirocin ointment (BACTROBAN) 2 % Apply to affected areas topically every day shift for skin infection    [provider]  Nutritional Supplements (FEEDING  SUPPLEMENT, JEVITY 1.5 CAL,) LIQD Give 1 can via PEG if less than 50% of meal consumed    [provider]  Nutritional Supplements (NUTRITIONAL SUPPLEMENT PO) HSG Regular Diet - HSG Mech Soft Texture, Nectar Consistency    [provider]  risperiDONE (RISPERDAL) 1 MG tablet Place 1 mg into feeding tube at bedtime.    [provider]  tizanidine (ZANAFLEX) 2 MG capsule Place 2 mg into feeding tube 2 (two) times daily.    [provider]  UNABLE TO FIND Flush G-Tube every 6 hours with 200 ml of walter for hydration and tube patency.  Flush with 5-68ml water between each medication    [provider]    Physical Exam: Vitals:   12/01/16 1700 12/01/16 1730 12/01/16 1800 12/01/16 1830  BP: 114/86 120/84 122/80 124/84  Pulse: 78 85 79 76  Resp: Temp:      TempSrc:      SpO2: 98% 100% 100% 100%     General:  Appears calm and comfortable Eyes:  PERRL, EOMI, normal lids, iris ENT:  grossly normal hearing, lips & tongue, mmm Neck:  no LAD, masses or thyromegaly Cardiovascular:  RRR, II/VI systolic murmur. No LE edema.  Respiratory:  CTA bilaterally, no w/r/r. Normal respiratory effort. Abdomen:  soft, ntnd, NABS Skin:  no rash or induration seen on limited exam Musculoskeletal: No bony abnormalities appreciated. Upper extremity contracture noted.  Psychiatric:  pleasant. Follows very basic commands. Unable to provide any reliable history.  Neurologic: CN 2-12 grossly intact, sensation intact  Labs on Admission: I have personally reviewed following labs and imaging studies  CBC:  Recent Labs Lab 12/01/16 1211  WBC 2.8*  NEUTROABS 1.5*  HGB 11.1*  HCT 33.6*  MCV 88.2  PLT 131*   Basic Metabolic Panel:  Recent Labs Lab 12/01/16 1211  NA 138  K 3.7  CL 106  CO2 27  GLUCOSE 99  BUN 17  CREATININE 1.04  CALCIUM 8.9   GFR: Estimated Creatinine Clearance: 90.2 mL/min (by C-G formula based on SCr of 1.04  mg/dL). Liver Function Tests:  Recent Labs Lab 12/01/16 1211  AST 14*  ALT 14*  ALKPHOS 32*  BILITOT 0.5  PROT 5.9*  ALBUMIN 3.1*   No results for input(s): LIPASE, AMYLASE in the last 168 hours. No results for input(s): AMMONIA in the last 168 hours. Coagulation Profile: No results for input(s): INR, PROTIME in the last 168 hours. Cardiac Enzymes: No results for input(s): CKTOTAL, CKMB, CKMBINDEX, TROPONINI in the last 168 hours. BNP (last 3 results) No results for input(s): PROBNP in the last 8760 hours. HbA1C: No results for input(s): HGBA1C in the last 72 hours. CBG:  Recent Labs Lab 12/01/16 1221  GLUCAP 95   Lipid Profile: No results for input(s): CHOL, HDL, LDLCALC, TRIG,  CHOLHDL, LDLDIRECT in the last 72 hours. Thyroid Function Tests: No results for input(s): TSH, T4TOTAL, FREET4, T3FREE, THYROIDAB in the last 72 hours. Anemia Panel: No results for input(s): VITAMINB12, FOLATE, FERRITIN, TIBC, IRON, RETICCTPCT in the last 72 hours. Urine analysis: No results found for: COLORURINE, APPEARANCEUR, LABSPEC, PHURINE, GLUCOSEU, HGBUR, BILIRUBINUR, KETONESUR, PROTEINUR, UROBILINOGEN, NITRITE, LEUKOCYTESUR  Creatinine Clearance: Estimated Creatinine Clearance: 90.2 mL/min (by C-G formula based on SCr of 1.04 mg/dL).  Sepsis Labs: (procalcitonin:4,lacticidven:4) )No results found for this or any previous visit (from the past 240 hour(s)).   Radiological Exams on Admission: Ct Head Wo Contrast  Result Date: 12/01/2016 CLINICAL DATA:  51 year old presenting with acute mental status changes, becoming unresponsive at the nursing home and unable to answer questions thereafter personal history of stroke. EXAM: CT HEAD WITHOUT CONTRAST TECHNIQUE: Contiguous axial images were obtained from the base of the skull through the vertex without intravenous contrast. COMPARISON:  None. FINDINGS: Motion blurred several of the images during the examination but the study  appears diagnostic. Brain: Head tilt in the gantry accounts for apparent asymmetry. Encephalomalacia involving the left occipital lobe and the left posterior temporal lobe. Low attenuation involving pons. Ventricular system normal in size and appearance for age. Severe changes of small vessel disease of the white matter diffusely No mass lesion. No midline shift. No acute hemorrhage or hematoma. No extra-axial fluid collections. No evidence of acute infarction. Vascular: Mild bilateral carotid siphon and left vertebral artery atherosclerosis. Atretic right vertebral artery. No hyperdense vessel. Skull: No skull fracture or other focal osseous abnormality involving the skull. Sinuses/Orbits: Visualized paranasal sinuses, bilateral mastoid air cells and bilateral middle ear cavities well-aerated. Visualized orbits and globes are normal. Other: None. IMPRESSION: 1. No acute intracranial abnormality. 2. Remote cortical stroke involving the left occipital lobe and left superior temporal lobe (left posterior cerebral artery distribution). 3. Remote stroke involving the pons. 4. Severe chronic microvascular ischemic changes involving the white matter. Electronically Signed   By: Hulan Saas M.D.   On: 12/01/2016 14:35   Ct Angio Chest Pe W And/or Wo Contrast  Result Date: 12/01/2016 CLINICAL DATA:  High probability of pulmonary embolism EXAM: CT ANGIOGRAPHY CHEST WITH CONTRAST TECHNIQUE: Multidetector CT imaging of the chest was performed using the standard protocol during bolus administration of intravenous contrast. Multiplanar CT image reconstructions and MIPs were obtained to evaluate the vascular anatomy. CONTRAST:  100 cc Isovue 370 COMPARISON:  Chest x-ray of 12/01/2016 FINDINGS: Cardiovascular: The heart is mildly enlarged. No pericardial effusion is seen. No significant coronary artery calcifications are evident. The mid ascending thoracic aorta measures 41 mm in diameter. Recommend annual imaging  followup by CTA or MRA. This recommendation follows 2010 ACCF/AHA/AATS/ACR/ASA/SCA/SCAI/SIR/STS/SVM Guidelines for the Diagnosis and Management of Patients with Thoracic Aortic Disease. Circulation. 2010; 121: Z610-R604. The pulmonary artery opacified well. No evidence of acute pulmonary embolism is seen. Linear artifacts do obscure some detail within the distal branches to the lower lobes. Mediastinum/Nodes: There may be a small hiatal hernia present. No mediastinal or hilar adenopathy is seen. The thyroid gland is unremarkable. Lungs/Pleura: On lung window images, no pneumonia is seen and there is no evidence of pleural effusion. No suspicious lung nodule is noted. The central airway is patent. Upper Abdomen: Images through the upper abdomen show no significant abnormality. The stomach is relatively distended with fluid. Musculoskeletal: There are mild degenerative changes throughout the thoracic spine diffusely. No compression deformity is seen. Review of the MIP images confirms the above findings. IMPRESSION: 1.  No evidence of acute pulmonary embolism is seen. 2. Fusiform dilatation of the ascending thoracic aorta measuring up to 41 mm in diameter. Recommend annual imaging followup by CTA or MRA. This recommendation follows 2010 ACCF/AHA/AATS/ACR/ASA/SCA/SCAI/SIR/STS/SVM Guidelines for the Diagnosis and Management of Patients with Thoracic Aortic Disease. Circulation. 2010; 121: Z610-R604 3. Possible small hiatal hernia. Electronically Signed   By: Dwyane Dee M.D.   On: 12/01/2016 16:23   Dg Chest Portable 1 View  Result Date: 12/01/2016 CLINICAL DATA:  Status post CVA last month with persistent right-sided weakness. Patient was found unresponsive in the wheelchair this morning without apparent cause. The patient underwent CPR for 3 minutes. EXAM: PORTABLE CHEST 1 VIEW COMPARISON:  None in PACs FINDINGS: The lungs are adequately inflated and clear. The heart is mildly enlarged. The pulmonary vascularity is  normal. The mediastinum is normal in width. The bony thorax is unremarkable. External pacemaker defibrillator pads are present. IMPRESSION: Mild cardiomegaly without pulmonary edema. No acute cardiopulmonary abnormality. Electronically Signed   By: Odalys Win  [place] M.D.   On: 12/01/2016 12:41    EKG: Independently reviewed. Sinus, brady, no ACS  Assessment/Plan Active Problems:   Hemiparesis affecting right side as late effect of stroke (HCC)   Essential hypertension   Dysphagia   S/P percutaneous endoscopic gastrostomy (PEG) tube placement (HCC)   Unresponsive   Bradycardia   Unresponsive Episode: Etiology not immediately clear but suspect cardiogenic. Reports were that patient was pulseless and required 3 minutes of CPR prior to return of pulse and consciousness. Patient is without any chest discomfort or signs of CPR. Question whether or not patient actually ever lost his pulse or simply became severely hypotensive. Initial troponin unremarkable and EKG showing sinus bradycardia without ACS. With time pulses spontaneously improved into a normal range as well as blood pressure. Initial pressures were in the systolic range of 80, but at time of admission have improved 130. Patient's mental status also has slowly improved since episode. No evidence of infectious process, seizure type activity, significant metabolic derangement, or medication induced symptoms. CT head without acute abnormality. No change in baseline neuro deficits from previous stroke. - Telemetry  - Neuro checks  - Consider MRI in a.m. if condition is not completely returned to baseline  - Cardiology consult and will follow  - Cycle troponins  - Hold metoprolol  Previous CVA: Pt undergoing rehab at [facility] and doing well prior to admission - PT/OT - SLP given known dysphagia 3 diet requirement and PEG tube for some feeds.  - ASA, Lipitor  HTN: - hold metoprolol and hydralazine, and norvasc - PRN IV hydralazine       DVT prophylaxis: Hep  Code Status: full  Family Communication: none  Disposition Plan: pending 24hr obs on Tele, cycle of trop, and cards eval  Consults called: cardiology  Admission status: observation    Ozella Rocks MD [facility]  If 7PM-7AM, please contact night-coverage [email or web address] Password [email or web address]  12/01/2016, 7:00 PM

## 2016-12-01 NOTE — Consult Note (Signed)
Reason for Consult: Cardiac arrest, bradycardia Referring Physician: Emergency Department  Jeremy Brennan is an 51 y.o. male.  HPI: 51 y/o AAM w/hypertension, hyperlipidemia, CVA with significant neurodeficits, s/p PEG tube, nursing home resident, brought to the emergency department after he was found unresponsive at the nursing home. He was felt not to have a pulse and brief CPR was performed. He was borderline hypotensive when brought to the ED. Cardiology were consulted for concern for bradycardia.  History of the actual events is incomplete in absence of collateral history. Patient is poor historian. He recollects trying to walk and then feeling "funny" and lightheaded without any chest pain., shortness of breath. At baseline, unsure of his functional capacity. He is able to speak coherently and probably back to his baseline.  Past Medical History:  Diagnosis Date  . Arthritis   . Contracture, left hand   . 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 Depression   . Hyperlipidemia   . Hypertension   . Stroke [facility])     Past Surgical History:  Procedure Laterality Date  . HERNIA REPAIR    . PEG TUBE PLACEMENT  07/01/2016    Family History  Problem Relation Age of Onset  . Hypertension Other     Social History:  reports that he has never smoked. He has never used smokeless tobacco. He reports that he does not use drugs. His alcohol history is not on file.  Allergies: No Known Allergies  Medications: I have reviewed the patient's current medications.  Results for orders placed or performed during the hospital encounter of 12/01/16 (from the past 48 hour(s))  CBC WITH DIFFERENTIAL     Status: Abnormal   Collection Time: 12/01/16 12:11 PM  Result Value Ref Range   WBC 2.8 (L) 4.0 - 10.5 K/uL   RBC 3.81 (L) 4.22 - 5.81 MIL/uL   Hemoglobin 11.1 (L) 13.0 - 17.0 g/dL   HCT 33.6 (L) 39.0 - 52.0 %   MCV 88.2 78.0 - 100.0 fL   MCH 29.1 26.0 - 34.0 pg   MCHC 33.0 30.0 - 36.0 g/dL   RDW  13.8 11.5 - 15.5 %   Platelets 131 (L) 150 - 400 K/uL   Neutrophils Relative % 55 %   Neutro Abs 1.5 (L) 1.7 - 7.7 K/uL   Lymphocytes Relative 31 %   Lymphs Abs 0.9 0.7 - 4.0 K/uL   Monocytes Relative 11 %   Monocytes Absolute 0.3 0.1 - 1.0 K/uL   Eosinophils Relative 3 %   Eosinophils Absolute 0.1 0.0 - 0.7 K/uL   Basophils Relative 0 %   Basophils Absolute 0.0 0.0 - 0.1 K/uL  Comprehensive metabolic panel     Status: Abnormal   Collection Time: 12/01/16 12:11 PM  Result Value Ref Range   Sodium 138 135 - 145 mmol/L   Potassium 3.7 3.5 - 5.1 mmol/L   Chloride 106 101 - 111 mmol/L   CO2 27 22 - 32 mmol/L   Glucose, Bld 99 65 - 99 mg/dL   BUN 17 6 - 20 mg/dL   Creatinine, Ser 1.04 0.61 - 1.24 mg/dL   Calcium 8.9 8.9 - 10.3 mg/dL   Total Protein 5.9 (L) 6.5 - 8.1 g/dL   Albumin 3.1 (L) 3.5 - 5.0 g/dL   AST 14 (L) 15 - 41 U/L   ALT 14 (L) 17 - 63 U/L   Alkaline Phosphatase 32 (L) 38 - 126 U/L   Total Bilirubin 0.5 0.3 - 1.2  mg/dL   GFR calc non Af Amer >60 >60 mL/min   GFR calc Af Amer >60 >60 mL/min    Comment: (NOTE) The eGFR has been calculated using the CKD EPI equation. This calculation has not been validated in all clinical situations. eGFR's persistently <60 mL/min signify possible Chronic Kidney Disease.    Anion gap 5 5 - 15  CBG monitoring, ED     Status: None   Collection Time: 12/01/16 12:21 PM  Result Value Ref Range   Glucose-Capillary 95 65 - 99 mg/dL  I-stat troponin, ED     Status: None   Collection Time: 12/01/16 12:32 PM  Result Value Ref Range   Troponin i, poc 0.01 0.00 - 0.08 ng/mL   Comment 3            Comment: Due to the release kinetics of cTnI, a negative result within the first hours of the onset of symptoms does not rule out myocardial infarction with certainty. If myocardial infarction is still suspected, repeat the test at appropriate intervals.     Ct Head Wo Contrast  Result Date: 12/01/2016 CLINICAL DATA:  51 year old presenting  with acute mental status changes, becoming unresponsive at the nursing home and unable to answer questions thereafter personal history of stroke. EXAM: CT HEAD WITHOUT CONTRAST TECHNIQUE: Contiguous axial images were obtained from the base of the skull through the vertex without intravenous contrast. COMPARISON:  None. FINDINGS: Motion blurred several of the images during the examination but the study appears diagnostic. Brain: Head tilt in the gantry accounts for apparent asymmetry. Encephalomalacia involving the left occipital lobe and the left posterior temporal lobe. Low attenuation involving pons. Ventricular system normal in size and appearance for age. Severe changes of small vessel disease of the white matter diffusely No mass lesion. No midline shift. No acute hemorrhage or hematoma. No extra-axial fluid collections. No evidence of acute infarction. Vascular: Mild bilateral carotid siphon and left vertebral artery atherosclerosis. Atretic right vertebral artery. No hyperdense vessel. Skull: No skull fracture or other focal osseous abnormality involving the skull. Sinuses/Orbits: Visualized paranasal sinuses, bilateral mastoid air cells and bilateral middle ear cavities well-aerated. Visualized orbits and globes are normal. Other: None. IMPRESSION: 1. No acute intracranial abnormality. 2. Remote cortical stroke involving the left occipital lobe and left superior temporal lobe (left posterior cerebral artery distribution). 3. Remote stroke involving the pons. 4. Severe chronic microvascular ischemic changes involving the white matter. Electronically Signed   By: Evangeline Dakin M.D.   On: 12/01/2016 14:35   Ct Angio Chest Pe W And/or Wo Contrast  Result Date: 12/01/2016 CLINICAL DATA:  High probability of pulmonary embolism EXAM: CT ANGIOGRAPHY CHEST WITH CONTRAST TECHNIQUE: Multidetector CT imaging of the chest was performed using the standard protocol during bolus administration of intravenous  contrast. Multiplanar CT image reconstructions and MIPs were obtained to evaluate the vascular anatomy. CONTRAST:  100 cc Isovue 370 COMPARISON:  Chest x-ray of 12/01/2016 FINDINGS: Cardiovascular: The heart is mildly enlarged. No pericardial effusion is seen. No significant coronary artery calcifications are evident. The mid ascending thoracic aorta measures 41 mm in diameter. Recommend annual imaging followup by CTA or MRA. This recommendation follows 2010 ACCF/AHA/AATS/ACR/ASA/SCA/SCAI/SIR/STS/SVM Guidelines for the Diagnosis and Management of Patients with Thoracic Aortic Disease. Circulation. 2010; 121: X937-J696. The pulmonary artery opacified well. No evidence of acute pulmonary embolism is seen. Linear artifacts do obscure some detail within the distal branches to the lower lobes. Mediastinum/Nodes: There may be a small hiatal hernia present. No  mediastinal or hilar adenopathy is seen. The thyroid gland is unremarkable. Lungs/Pleura: On lung window images, no pneumonia is seen and there is no evidence of pleural effusion. No suspicious lung nodule is noted. The central airway is patent. Upper Abdomen: Images through the upper abdomen show no significant abnormality. The stomach is relatively distended with fluid. Musculoskeletal: There are mild degenerative changes throughout the thoracic spine diffusely. No compression deformity is seen. Review of the MIP images confirms the above findings. IMPRESSION: 1. No evidence of acute pulmonary embolism is seen. 2. Fusiform dilatation of the ascending thoracic aorta measuring up to 41 mm in diameter. Recommend annual imaging followup by CTA or MRA. This recommendation follows 2010 ACCF/AHA/AATS/ACR/ASA/SCA/SCAI/SIR/STS/SVM Guidelines for the Diagnosis and Management of Patients with Thoracic Aortic Disease. Circulation. 2010; 121: G665-L935 3. Possible small hiatal hernia. Electronically Signed   By: Ivar Drape M.D.   On: 12/01/2016 16:23   Dg Chest Portable 1  View  Result Date: 12/01/2016 CLINICAL DATA:  Status post CVA last month with persistent right-sided weakness. Patient was found unresponsive in the wheelchair this morning without apparent cause. The patient underwent CPR for 3 minutes. EXAM: PORTABLE CHEST 1 VIEW COMPARISON:  None in PACs FINDINGS: The lungs are adequately inflated and clear. The heart is mildly enlarged. The pulmonary vascularity is normal. The mediastinum is normal in width. The bony thorax is unremarkable. External pacemaker defibrillator pads are present. IMPRESSION: Mild cardiomegaly without pulmonary edema. No acute cardiopulmonary abnormality. Electronically Signed   By: David  Martinique M.D.   On: 12/01/2016 12:41    Review of Systems  Constitutional: Negative for malaise/fatigue.  HENT: Negative.   Respiratory: Positive for cough. Negative for shortness of breath.   Cardiovascular: Negative for chest pain, palpitations and leg swelling.  Gastrointestinal: Negative for diarrhea.  Genitourinary: Negative.   Musculoskeletal: Negative.   Skin: Negative.   Neurological:       Episode of lightheadedness and brief loss of consciousness  Endo/Heme/Allergies: Negative.   Psychiatric/Behavioral: Positive for depression.  All other systems reviewed and are negative.  Blood pressure 112/78, pulse 73, temperature 98.2 F (36.8 C), temperature source Oral, resp. rate 16, height 5' 5"  (1.651 m), weight 86.4 kg (190 lb 7.6 oz), SpO2 100 %. Physical Exam  Nursing note and vitals reviewed. Constitutional: He is oriented to person, place, and time. He appears well-developed.  Unkempt  HENT:  Head: Normocephalic and atraumatic.  Eyes: Pupils are equal, round, and reactive to light.  Neck: No JVD present.  Cardiovascular: Regular rhythm.   Bradycardia  Respiratory: Effort normal and breath sounds normal. He has no wheezes. He has no rales. He exhibits no tenderness.  GI: Soft. Bowel sounds are normal.  PEG tube in place   Musculoskeletal: He exhibits no edema.  Neurological: He is alert and oriented to person, place, and time.  RUE 4/5, RLE 1/5, LUE 1/5, contracture, LLE 0/5  Skin: Skin is warm and dry. He is not diaphoretic.  Psychiatric:  Limited assessment. Flat affect   EKG 11/30/2016: Sinus bradycardia 51 bpm, first degree AV block No other conduction abnormality.  Assessment: 51 y/o AAM w/hypertension, hyperlipidemia, CVA with significant neurodeficits, s/p PEG tube, nursing home resident, brought to ED with episode of unresponsiveness  Questionable cardiac arrest Episode of unresponsiveness: Could be multifactorial. With stable cardiac exam and hemodynamic stability, unlikely to be a major cardiac event. I do not think he had a significant arrhythmogenic event. Sinus bradycardia on EKG, unlikely the inciting event for his episode  H/o CVA Hypertension Hyperlipidemia  Recommendations: Controlled blood pressure and resting bradycardia. Stop metoprolol. Recommend echocardiogram. Would not recommend any other invasive cardiac workup at this point.   Leonidus Rowand J Nasrin Lanzo 12/01/2016, 8:18 PM   Little Sturgeon, MD [facility] [place] Pager: [phone] Office: [phone] If no answer Cell [phone]

## 2016-12-01 NOTE — ED Triage Notes (Signed)
Pt coming from [facility] nursing facility. Pt has been there for recent CVA in August with right sided residual weakness, Peg tube in place. Pt has been eating. This morning staff found pt unresponsive in wheelchair, reportedly pulseless. Staff started CPR- approx 3 mins. EMS arrival, pt alert and oriented per norm. Per staff, pt walked to breakfast with PT, has been feeling fine; unknown how long pt unresponsive in wheelchair. Pt back at his baseline. EMS gave NS. Pt bradycardic 50's, BP 88/56, CBG 108.

## 2016-12-01 NOTE — Progress Notes (Signed)
Location:   [facility] Room Number: 37 D Place of Service:  SNF (31)   CODE STATUS: Full Code  No Known Allergies  Chief Complaint  Patient presents with  . Acute Visit    Sudden Cardiac Arrest    HPI:  Staff member witnessed him as he was slumping over in his wheelchair. He found without pulse and respirations CPR was intitiated; 911 called. CPR performed for 3 minutes; when he spontaneously responded. He was awake when EMS arrived.   Past Medical History:  Diagnosis Date  . Arthritis   . Contracture, left hand   . Depression   . Hyperlipidemia   . Hypertension   . Stroke [facility])     Past Surgical History:  Procedure Laterality Date  . HERNIA REPAIR    . PEG TUBE PLACEMENT  07/01/2016    Social History   Social History  . Marital status: Married    Spouse name: N/A  . Number of children: N/A  . Years of education: N/A   Occupational History  . Not on file.   Social History Main Topics  . Smoking status: Never Smoker  . Smokeless tobacco: Never Used  . Alcohol use Not on file  . Drug use: No  . Sexual activity: Not on file   Other Topics Concern  . Not on file   Social History Narrative  . No narrative on file   Family History  Problem Relation Age of Onset  . Hypertension Other       VITAL SIGNS There were no vitals taken for this visit.  Patient's Medications  New Prescriptions   No medications on file  Previous Medications   ACETAMINOPHEN (TYLENOL) 325 MG TABLET    Place 650 mg into feeding tube every 4 (four) hours as needed for mild pain (for fever greater than 100).   AMLODIPINE (NORVASC) 10 MG TABLET    Place 10 mg into feeding tube daily.   ASPIRIN 325 MG TABLET    Place 325 mg into feeding tube daily.   ATORVASTATIN (LIPITOR) 10 MG TABLET    Give 2 tablets via PEG-Tube daily at bedtime   DOCUSATE SODIUM 100 MG CAPSULE    Place 100 mg into feeding tube daily as needed.    FLUOXETINE (PROZAC) 20 MG CAPSULE    Place 20  mg into feeding tube daily.   HYDRALAZINE (APRESOLINE) 25 MG TABLET    Place 25 mg into feeding tube 2 (two) times daily.   METOPROLOL TARTRATE (LOPRESSOR) 25 MG TABLET    Place 25 mg into feeding tube 2 (two) times daily.   MOXIFLOXACIN (VIGAMOX) 0.5 % OPHTHALMIC SOLUTION    Place 1 drop into both eyes 3 (three) times daily.   MUPIROCIN OINTMENT (BACTROBAN) 2 %    Apply to affected areas topically every day shift for skin infection   NUTRITIONAL SUPPLEMENTS (FEEDING SUPPLEMENT, JEVITY 1.5 CAL,) LIQD    Give 1 can via PEG if less than 50% of meal consumed   NUTRITIONAL SUPPLEMENTS (NUTRITIONAL SUPPLEMENT PO)    HSG Regular Diet - HSG Mech Soft Texture, Nectar Consistency   RISPERIDONE (RISPERDAL) 1 MG TABLET    Place 1 mg into feeding tube at bedtime.   TIZANIDINE (ZANAFLEX) 2 MG CAPSULE    Place 2 mg into feeding tube 2 (two) times daily.   UNABLE TO FIND    Flush G-Tube every 6 hours with 200 ml of walter for hydration and tube patency.  Flush with 5-9ml  water between each medication  Modified Medications   No medications on file  Discontinued Medications   NUTRITIONAL SUPPLEMENTS (NUTRITIONAL SUPPLEMENT PO)    HSG Puree Diet - HSG Puree texture, regular consistency     SIGNIFICANT DIAGNOSTIC EXAMS   NONE RECENT  LABS REVIEWED; PREVIOUS  10-26-16: wbc 4.6; hgb 13.5; hct 41.7; mcv 91.8; plt 166 glucose 100; bun 17.2; creat 0.72; k+ 4.4 ;na++ 141; liver normal albumin 3.7 hgb a1c 4.5  11-05-16: glucose 85; bun 9.3; creat 0.66; k+ 4.2; na++ 138; ca 9.7  NO NEW LABS    Review of Systems  Unable to perform ROS: Medical condition (non-responsive )   Physical Exam  Constitutional: He appears well-developed and well-nourished.  Neck: Neck supple.  Cardiovascular:  Without pulse   Pulmonary/Chest:  No respirations   Abdominal: Bowel sounds are normal.  Lymphadenopathy:    He has no cervical adenopathy.   Physical Exam  Constitutional: He appears well-nourished. No distress.    HENT:  Head: Normocephalic.  Eyes: Conjunctivae are normal.  Neck: Neck supple. No thyromegaly present.  Cardiovascular: Normal rate, regular rhythm, normal heart sounds and intact distal pulses.   Pulmonary/Chest: Effort normal and breath sounds normal. No respiratory distress.  Abdominal: Soft. Bowel sounds are normal. He exhibits no distension. There is no tenderness.  Has peg tube present without signs of infection present.   Musculoskeletal: He exhibits no edema.  Right side hemiparesis   Lymphadenopathy:    He has no cervical adenopathy.  Neurological: He is alert.  Skin: Skin is warm and dry. He is not diaphoretic.  Psychiatric: He has a normal mood and affect.    ASSESSMENT/ PLAN:   1. Sudden cardiac arrest: has been sent to the ED for further evaluation and treatment.    MD is aware of resident's narcotic use and is in agreement with current plan of care. We will attempt to wean resident as apropriate   Synthia Innocent NP [facility]  Contact [phone] Monday through Friday 8am- 5pm  After hours call [phone]

## 2016-12-01 NOTE — ED Notes (Signed)
CT unable to obtain CT chest due to IV infiltrating. Will call IV team for new IV

## 2016-12-01 NOTE — ED Notes (Signed)
Pt appears more alert than earlier assessments. RN and NT changed pt's linens/gown/diapers.

## 2016-12-01 NOTE — ED Notes (Signed)
Pt placed on zoll monitor

## 2016-12-01 NOTE — ED Notes (Signed)
Notified CT that pt is ready for transport- RN not needed to escort at this time per MD

## 2016-12-01 NOTE — ED Notes (Signed)
This RN spoke with RN from [facility] that was caring for pt. RN states pt had normal morning- ate breakfast, walked to breakfast. Pt normally can converse, answer questions. Is not normally oriented to date/time. Pt weaker on right side, but is able to follow commands normally. RN states he was only unattended for approx 10-15 minutes, so some time during then he became unresponsive. While in the ED, pt not answering all questions, not able to follow all commands. Per report with RN from [facility], this is not pt's baseline.

## 2016-12-01 NOTE — ED Notes (Signed)
Denies CP at this time

## 2016-12-02 ENCOUNTER — Observation Stay (HOSPITAL_COMMUNITY): Payer: Medicaid Other

## 2016-12-02 DIAGNOSIS — F0390 Unspecified dementia without behavioral disturbance: Secondary | ICD-10-CM | POA: Diagnosis present

## 2016-12-02 DIAGNOSIS — F329 Major depressive disorder, single episode, unspecified: Secondary | ICD-10-CM | POA: Diagnosis present

## 2016-12-02 DIAGNOSIS — G9341 Metabolic encephalopathy: Secondary | ICD-10-CM | POA: Diagnosis present

## 2016-12-02 DIAGNOSIS — R001 Bradycardia, unspecified: Secondary | ICD-10-CM | POA: Diagnosis present

## 2016-12-02 DIAGNOSIS — I440 Atrioventricular block, first degree: Secondary | ICD-10-CM | POA: Diagnosis present

## 2016-12-02 DIAGNOSIS — I9589 Other hypotension: Secondary | ICD-10-CM | POA: Diagnosis present

## 2016-12-02 DIAGNOSIS — D61818 Other pancytopenia: Secondary | ICD-10-CM | POA: Diagnosis present

## 2016-12-02 DIAGNOSIS — R4189 Other symptoms and signs involving cognitive functions and awareness: Secondary | ICD-10-CM | POA: Diagnosis not present

## 2016-12-02 DIAGNOSIS — Z79899 Other long term (current) drug therapy: Secondary | ICD-10-CM | POA: Diagnosis not present

## 2016-12-02 DIAGNOSIS — Z931 Gastrostomy status: Secondary | ICD-10-CM | POA: Diagnosis not present

## 2016-12-02 DIAGNOSIS — R1310 Dysphagia, unspecified: Secondary | ICD-10-CM

## 2016-12-02 DIAGNOSIS — Z7982 Long term (current) use of aspirin: Secondary | ICD-10-CM | POA: Diagnosis not present

## 2016-12-02 DIAGNOSIS — I10 Essential (primary) hypertension: Secondary | ICD-10-CM | POA: Diagnosis present

## 2016-12-02 DIAGNOSIS — I69351 Hemiplegia and hemiparesis following cerebral infarction affecting right dominant side: Secondary | ICD-10-CM | POA: Diagnosis not present

## 2016-12-02 DIAGNOSIS — E785 Hyperlipidemia, unspecified: Secondary | ICD-10-CM | POA: Diagnosis present

## 2016-12-02 LAB — BASIC METABOLIC PANEL
Anion gap: 6 (ref 5–15)
BUN: 11 mg/dL (ref 6–20)
CHLORIDE: 107 mmol/L (ref 101–111)
CO2: 23 mmol/L (ref 22–32)
Calcium: 9 mg/dL (ref 8.9–10.3)
Creatinine, Ser: 0.77 mg/dL (ref 0.61–1.24)
GFR calc non Af Amer: >60 mL/min (ref >60)
Glucose, Bld: 83 mg/dL (ref 65–99)
POTASSIUM: 3.6 mmol/L (ref 3.5–5.1)
SODIUM: 136 mmol/L (ref 135–145)

## 2016-12-02 LAB — TROPONIN I

## 2016-12-02 LAB — CBC
HCT: 34.8 % — ABNORMAL LOW (ref 39.0–52.0)
HEMOGLOBIN: 11.3 g/dL — AB (ref 13.0–17.0)
MCH: 28.6 pg (ref 26.0–34.0)
MCHC: 32.5 g/dL (ref 30.0–36.0)
MCV: 88.1 fL (ref 78.0–100.0)
Platelets: 134 10*3/uL — ABNORMAL LOW (ref 150–400)
RBC: 3.95 MIL/uL — AB (ref 4.22–5.81)
RDW: 13.7 % (ref 11.5–15.5)
WBC: 2.6 10*3/uL — ABNORMAL LOW (ref 4.0–10.5)

## 2016-12-02 LAB — ECHOCARDIOGRAM COMPLETE
HEIGHTINCHES: 65 in
Weight: 3044.11 oz

## 2016-12-02 LAB — HIV ANTIBODY (ROUTINE TESTING W REFLEX): HIV Screen 4th Generation wRfx: NONREACTIVE

## 2016-12-02 LAB — GLUCOSE, CAPILLARY: Glucose-Capillary: 86 mg/dL (ref 65–99)

## 2016-12-02 MED ORDER — ATORVASTATIN CALCIUM 20 MG PO TABS
20.0000 mg | ORAL_TABLET | Freq: Every day | ORAL | Status: DC
  Administered 2016-12-02: 20 mg via ORAL
  Filled 2016-12-02: qty 1

## 2016-12-02 MED ORDER — JEVITY 1.5 CAL/FIBER PO LIQD
237.0000 mL | Freq: Three times a day (TID) | ORAL | Status: DC | PRN
  Filled 2016-12-02 (×3): qty 1000

## 2016-12-02 NOTE — Progress Notes (Signed)
  Initial Nutrition Assessment  DOCUMENTATION CODES:   Not applicable  INTERVENTION:   Magic cup BID with meals, each supplement provides 290 kcal and 9 grams of protein  Feeding Assitance at meal periods  Tube Feeding:   -Recommend changing to Jevity 1.5 1 can (237 mL) TID prn to be administered via G-tube if pt consumes <50% of meal  -Each can provides 355 kcals, 15.1 grams of protein and 180 mL of free water. G-tube should be flushes with 30 mL of room temperature water before and after administration of TF.   NUTRITION DIAGNOSIS:   Inadequate oral intake related to chronic illness as evidenced by estimated needs (supplemental tube feeding).  GOAL:   Patient will meet greater than or equal to 90% of their needs  MONITOR:   PO intake, TF tolerance, Supplement acceptance, Labs, Weight trends, Skin  REASON FOR ASSESSMENT:   Malnutrition Screening Tool    ASSESSMENT:   Pt admitted with episode of unresponsiveness; pt with hx of HTN, HLD, CVA wit significant neuro deficits, PEG tube   Diet advanced to Dysphagia II with Thin Liquids post SLP eval this AM. Recorded po intake 0% this AM, pt reports he did not get breakfast this AM. Pt reports he has good appetite. Pt likes sweets like pudding and ice cream. Reports he does not take supplements like Ensure or Boost.  Order for Osmolite 1.5 237 mL every 24 hours (to be administered if meal completion <50%)  Per SNF admission papers, pt had been receiving Jevity 1.5 237 mL (1 can) TID prn if po intake of meal <50%.   No recent wt loss per weight encounters.   Nutrition-Focused physical exam completed. Findings are no fat depletion, mild/moderate muscle depletion in LE, and no edema.   Labs: reviewed Meds: 1/2 NS at 75 ml/hr  Diet Order:  DIET DYS 2 Room service appropriate? Yes; Fluid consistency: Thin  Skin:  Reviewed, no issues  Last BM:  no documented BM  Height:   Ht Readings from Last 1 Encounters:  12/01/16   (1.651 m)    Weight:   Wt Readings from Last 1 Encounters:  12/02/16 190 lb 4.1 oz (86.3 kg)    Ideal Body Weight:     BMI:  Body mass index is 31.66 kg/m.  Estimated Nutritional Needs:   Kcal:  1900-2200 kcals  Protein:  95-110 g  Fluid:  >/= 2 L  EDUCATION NEEDS:   No education needs identified at this time  Romelle Starcher MS, RD, LDN [phone] Pager  [phone] Weekend/On-Call Pager

## 2016-12-02 NOTE — Evaluation (Signed)
Occupational Therapy Evaluation Patient Details Name: Jeremy Brennan MRN: [id number] DOB: 11/29/1965 Today's Date: 12/02/2016    History of Present Illness Pt is a 51 y.o. male who was brought to the ED after being found unresponsive at SNF and requiring approximately 3 minutes of CPR. Bradycardia following CPR. PMH significant for hypertension, hyperlipidemia, arthritis, dementia, depression, CVA with significant neuro deficits, and s/p PEG tube.   Clinical Impression   Pt has been at [facility] for rehabilitation PTA. He currently requires min assist for seated grooming tasks and max assist for other ADL. He was able to sit at EOB for grooming tasks and pt required max assist for bed mobility. Pt would benefit from continued OT services while admitted to improve independence with ADL and functional mobility in preparation for continued OT services at SNF level post-acute D/C.    Follow Up Recommendations  SNF    Equipment Recommendations   (TBD at next venue )    Recommendations for Other Services       Precautions / Restrictions Precautions Precautions: Fall Precaution Comments: R sided weakness from CVA,  L hand contracture      Mobility Bed Mobility Overal bed mobility: Needs Assistance Bed Mobility: Supine to Sit;Sit to Supine     Supine to sit: Max assist Sit to supine: Max assist   General bed mobility comments: Pt able to initiate with L LE and initiate with core but unable to progress without max assist.   Transfers Overall transfer level: Needs assistance Equipment used: 1 person hand held assist Transfers: Sit to/from Stand Sit to Stand: Max assist         General transfer comment: Max assist to lift and steady. Dizziness reported upon standing and unable to safely progress to stand-pivot.     Balance Overall balance assessment: Needs assistance Sitting-balance support: No upper extremity supported;Feet supported Sitting balance-Leahy Scale: Poor      Standing balance support: Bilateral upper extremity supported;No upper extremity supported Standing balance-Leahy Scale: Poor                             ADL either performed or assessed with clinical judgement   ADL Overall ADL's : Needs assistance/impaired Eating/Feeding: NPO   Grooming: Minimal assistance;Sitting   Upper Body Bathing: Maximal assistance;Sitting   Lower Body Bathing: Sit to/from stand;Total assistance   Upper Body Dressing : Maximal assistance;Sitting   Lower Body Dressing: Total assistance;Sit to/from stand   Toilet Transfer: Maximal assistance Toilet Transfer Details (indicate cue type and reason): Max assist for sit<>stand. Unable to shift weight for pivot safely with one assist.  Toileting- Clothing Manipulation and Hygiene: Total assistance;Sit to/from stand         General ADL Comments: Pt able to complete grooming tasks with set-up but requires assistance for initiation of task.      Vision   Additional Comments: Need further assessment.      Perception     Praxis      Pertinent Vitals/Pain Pain Assessment: Faces Faces Pain Scale: Hurts even more Pain Location: "all over" during supine to sit Pain Descriptors / Indicators: Aching     Hand Dominance     Extremity/Trunk Assessment Upper Extremity Assessment Upper Extremity Assessment: RUE deficits/detail;LUE deficits/detail RUE Deficits / Details: Able to complete approximately 0-20 degrees forward flexion at shoulder. Minimal movement at R elbow and wrist. Brunnstrom level II.  LUE Deficits / Details: L hand contracture. Pain with attempted  PROM. AROM WFL at elbow and shoulder with 3/5 strength.    Lower Extremity Assessment Lower Extremity Assessment: Defer to PT evaluation       Communication     Cognition Arousal/Alertness: Awake/alert Behavior During Therapy: Flat affect Overall Cognitive Status: History of cognitive impairments - at baseline                                  General Comments: Able to follow one-step commands. Does have history of cognitive deficits from previous CVA. Noted slow processing with increased time to respond.    General Comments       Exercises     Shoulder Instructions      Home Living Family/patient expects to be discharged to:: Skilled nursing facility                                 Additional Comments: Has been at [facility] for rehabilitation. Reports working on standing and ambulating with assistance.       Prior Functioning/Environment Level of Independence: Needs assistance  Gait / Transfers Assistance Needed: Working on ambulating with assistance at SNF.  ADL's / Homemaking Assistance Needed: Pt requires assistance for all ADL but reports working to improve independence at rehab.            OT Problem List: Decreased strength;Decreased activity tolerance;Impaired balance (sitting and/or standing);Impaired vision/perception;Decreased safety awareness;Decreased knowledge of use of DME or AE;Decreased knowledge of precautions;Pain;Impaired UE functional use      OT Treatment/Interventions: Therapeutic exercise;Therapeutic activities;Self-care/ADL training;DME and/or AE instruction;Patient/family education;Balance training    OT Goals(Current goals can be found in the care plan section) Acute Rehab OT Goals Patient Stated Goal: back to rehab OT Goal Formulation: With patient Time For Goal Achievement: 12/16/16 Potential to Achieve Goals: Fair ADL Goals Pt Will Perform Grooming: with supervision;with set-up;sitting Additional ADL Goal #1: Pt will complete bed mobility in preparation for ADL seated at EOB with overall min assist.  Additional ADL Goal #2: Pt will maintain seated balance with supervision while completing ADL tasks at EOB.   OT Frequency: Min 1X/week   Barriers to D/C:            Co-evaluation              AM-PAC PT "6 Clicks" Daily Activity      Outcome Measure Help from another person eating meals?: Total Help from another person taking care of personal grooming?: A Little Help from another person toileting, which includes using toliet, bedpan, or urinal?: A Lot Help from another person bathing (including washing, rinsing, drying)?: A Lot Help from another person to put on and taking off regular upper body clothing?: A Lot Help from another person to put on and taking off regular lower body clothing?: Total 6 Click Score: 11   End of Session Equipment Utilized During Treatment: Gait belt Nurse Communication: Mobility status  Activity Tolerance: Patient tolerated treatment well Patient left: in bed;with call bell/phone within reach  OT Visit Diagnosis: Hemiplegia and hemiparesis;Other symptoms and signs involving cognitive function Hemiplegia - Right/Left: Right Hemiplegia - caused by: Cerebral infarction                Time: 0803-0820 OT Time Calculation (min): 17 min Charges:  OT General Charges $OT Visit: 1 Visit OT Evaluation $OT Eval High Complexity: 1 High G-Codes: OT G-codes **  NOT FOR INPATIENT CLASS** Functional Assessment Tool Used: Clinical judgement Functional Limitation: Self care Self Care Current Status (Z6109): At least 60 percent but less than 80 percent impaired, limited or restricted Self Care Goal Status (U0454): At least 20 percent but less than 40 percent impaired, limited or restricted   Doristine Section, MS OTR/L  Pager: [phone]   Elden Brucato A Solenne Manwarren 12/02/2016, 1:10 PM

## 2016-12-02 NOTE — Progress Notes (Signed)
  Echocardiogram 2D Echocardiogram has been performed.  Jeremy Brennan 12/02/2016, 11:07 AM

## 2016-12-02 NOTE — Progress Notes (Signed)
PT Cancellation Note  Patient Details Name: Jeremy Brennan MRN: [id number] DOB: 09-Oct-1965   Cancelled Treatment:    Reason Eval/Treat Not Completed: Patient at procedure or test/unavailable   Was undergoing EEG at time of eval attempt;   Will follow up for PT eval tomorrow;   Van Clines, PT  Acute Rehabilitation Services Pager [phone] Office [phone]    Levi Aland 12/02/2016, 4:11 PM

## 2016-12-02 NOTE — Evaluation (Signed)
Clinical/Bedside Swallow Evaluation Patient Details  Name: Jeremy Brennan MRN: [id number] Date of Birth: September 08, 1965  Today's Date: 12/02/2016 Time: SLP Start Time (ACUTE ONLY): 4782 SLP Stop Time (ACUTE ONLY): 0855 SLP Time Calculation (min) (ACUTE ONLY): 23 min  Past Medical History:  Past Medical History:  Diagnosis Date  . Arthritis   . Contracture, left hand   . 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 Depression   . Hyperlipidemia   . Hypertension   . Stroke [facility])    Past Surgical History:  Past Surgical History:  Procedure Laterality Date  . HERNIA REPAIR    . PEG TUBE PLACEMENT  07/01/2016   HPI:  Jeremy Brennan is a 51 y.o.malewith medical history significant of CVA with residual deficits, hypertension, hyperlipidemia, depression, dementia. Patient presents by EMS staff after being transported from [facility] following unresponsive state. Jeremy Brennan had PEG placed on 07/01/16. Chest CT is clear. Head CT shows remote cortical stroke involving the left occipital lobe and left superior temporal lobe and remote stroke involving the pons.   Assessment / Plan / Recommendation Clinical Impression  Jeremy Brennan presents with mild-moderate oropharyngeal dysphagia secondary to previous stroke; characterized by prolonged oral transit, multiple swallows, audible swallow indicating possible delay in swallow initiation and delayed cough following consecutive sips of thin liquid via straw. When given small sips of thin liquid, no s/sx of aspiration observed. Generalized facial and oral weakness noted by slow lingual movement and reduced facial/lingual ROM. Jeremy Brennan exhibits hypophonia and overall reduced effort. Recommend Dys 2 diet (chopped) with thin liquid. Advised RN to proceed with small bites/sips, check for buccal pocketing and follow bites with sips of liquid. Will f/u for diet check.    SLP Visit Diagnosis: Dysphagia, unspecified (R13.10)    Aspiration Risk  Mild aspiration risk    Diet Recommendation Dysphagia 2  (Fine chop);Thin liquid   Liquid Administration via: Cup;Straw Medication Administration: Whole meds with puree Supervision: Full supervision/cueing for compensatory strategies Compensations: Slow rate;Small sips/bites;Lingual sweep for clearance of pocketing;Follow solids with liquid Postural Changes: Seated upright at 90 degrees    Other  Recommendations Oral Care Recommendations: Oral care BID   Follow up Recommendations Skilled Nursing facility      Frequency and Duration min 2x/week  2 weeks       Prognosis Prognosis for Safe Diet Advancement: Fair Barriers to Reach Goals: Severity of deficits;Motivation      Swallow Study   General HPI: Jeremy Brennan is a 51 y.o.malewith medical history significant of CVA with residual deficits, hypertension, hyperlipidemia, depression, dementia. Patient presents by EMS staff after being transported from [facility] following unresponsive state. Jeremy Brennan had PEG placed on 07/01/16. Chest CT is clear. Head CT shows remote cortical stroke involving the left occipital lobe and left superior temporal lobe and remote stroke involving the pons. Type of Study: Bedside Swallow Evaluation Diet Prior to this Study: NPO Temperature Spikes Noted: No Respiratory Status: Room air History of Recent Intubation: No Behavior/Cognition: Alert;Cooperative;Pleasant mood Oral Cavity Assessment: Within Functional Limits Oral Care Completed by SLP: No Oral Cavity - Dentition: Missing dentition Self-Feeding Abilities: Needs assist Patient Positioning: Upright in bed Baseline Vocal Quality: Low vocal intensity Volitional Cough: Weak    Oral/Motor/Sensory Function Overall Oral Motor/Sensory Function: Moderate impairment Facial ROM: Reduced right;Reduced left Facial Symmetry: Within Functional Limits Facial Strength: Reduced right;Reduced left Lingual ROM: Reduced right;Reduced left Lingual Symmetry: Abnormal symmetry left Lingual Strength: Reduced Velum: Within Functional  Limits   Ice Chips Ice chips: Within functional  limits   Thin Liquid Thin Liquid: Impaired Presentation: Cup;Straw Pharyngeal  Phase Impairments: Cough - Delayed;Suspected delayed Swallow;Multiple swallows    Nectar Thick Nectar Thick Liquid: Not tested   Honey Thick Honey Thick Liquid: Not tested   Puree Puree: Impaired Presentation: Spoon Oral Phase Impairments: Reduced lingual movement/coordination Oral Phase Functional Implications: Prolonged oral transit Pharyngeal Phase Impairments: Multiple swallows   Solid   GO   Solid: Within functional limits        Carmela Rima, Student SLP 12/02/2016,9:36 AM

## 2016-12-02 NOTE — Progress Notes (Signed)
PROGRESS NOTE    BRYN PERKIN  ZOX:[id number] DOB: 1965/08/18 DOA: 12/01/2016 PCP: Kirt Boys, DO   Outpatient Specialists:     Brief Narrative:  Jeremy Brennan is a 51 y.o. male with medical history significant of CVA with residual deficits, hypertension, hyperlipidemia, depression, dementia.   Patient presents by EMS staff after being transported from [facility]. Pt was sent to [facility] from [facility] after suffering and being treated for a stroke. Per report patient had been doing well since his admission to their facility on 10/29/2016. Patient was gaining strength and recently had started to ambulate on his own. There are no recent complaints of fevers, confusion, chest pain, shortness breath, palpitations, cough, abdominal pain, dysuria, frequency, worsening neurological deficits, headache. On day of admission patient was in his normal state of health and doing well. There is a 10 minute interval where patient was not seen and then was found unresponsive. Nursing home staff state that they were amenable to find a pulse and began CPR. CPR lasted for 3 minutes. Patient had  return of pulse he had a systolic pressure in the 80s and a very slow pulse. Patient had a normal glucose level at the time. No further history is able to be obtained   Assessment & Plan:   Active Problems:   Hemiparesis affecting right side as late effect of stroke (HCC)   Essential hypertension   Dysphagia   S/P percutaneous endoscopic gastrostomy (PEG) tube placement (HCC)   Unresponsive   Bradycardia  Unresponsive Episode:  -got CPR -CE negative -EKG shows bradycardia-- stop metoprolol -No evidence of infectious process, seizure type activity, significant metabolic derangement, or medication induced symptoms. CT head without acute abnormality. No change in baseline neuro deficits from previous stroke. - seen by cardiology-- no further work up required -echo  pending -EEG ordered to r/o seizures  Previous CVA: Pt undergoing rehab at [facility] and doing well prior to admission - PT/OT- SNF - SLP- DYS diet - ASA, Lipitor --still bradycardic at night-- may need sleep study-- watch on tele today with BB stopped  HTN: - d/c metoprolol and hydralazine, and norvasc - BP on low side  Pancytopenia -? From the Risperdal defer to MD at SNF benefit/risk ratio    DVT prophylaxis:  SQ Heparin  Code Status: Full Code   Family Communication: None at bedside  Disposition Plan:     Consultants:   cards     Subjective: Feels weak all over  Objective: Vitals:   12/01/16 1900 12/01/16 1956 12/02/16 0645 12/02/16 1231  BP: 105/75 112/78 (!) 142/76 106/71  Pulse: 76 73 94 88  Resp: Temp:  98.2 F (36.8 C) 98.3 F (36.8 C) 97.9 F (36.6 C)  TempSrc:  Oral Oral Oral  SpO2: 100% 100% 100% 100%  Weight:  86.4 kg (190 lb 7.6 oz) 86.3 kg (190 lb 4.1 oz)   Height:   (1.651 m)      Intake/Output Summary (Last 24 hours) at 12/02/16 1423 Last data filed at 12/02/16 0956  Gross per 24 hour  Intake          1315.33 ml  Output              200 ml  Net          1115.33 ml   Filed Weights   12/01/16 1956 12/02/16 0645  Weight: 86.4 kg (190 lb 7.6 oz) 86.3 kg (190 lb 4.1  oz)    Examination:  General exam: chronically ill appearing- cooperative Respiratory system: clear, no wheezing Cardiovascular system: rrr Gastrointestinal system: +Bs, soft Central nervous system: Alert- speech low but appropriate  Extremities: contractures present in upper body, weak LE R>L      Data Reviewed: I have personally reviewed following labs and imaging studies  CBC:  Recent Labs Lab 12/01/16 1211 12/02/16 0718  WBC 2.8* 2.6*  NEUTROABS 1.5*  --   HGB 11.1* 11.3*  HCT 33.6* 34.8*  MCV 88.2 88.1  PLT 131* 134*   Basic Metabolic Panel:  Recent Labs Lab 12/01/16 1211 12/02/16 0718  NA 138 136  K 3.7 3.6  CL  106 107  CO2 27 23  GLUCOSE 99 83  BUN 17 11  CREATININE 1.04 0.77  CALCIUM 8.9 9.0   GFR: Estimated Creatinine Clearance: 107.8 mL/min (by C-G formula based on SCr of 0.77 mg/dL). Liver Function Tests:  Recent Labs Lab 12/01/16 1211  AST 14*  ALT 14*  ALKPHOS 32*  BILITOT 0.5  PROT 5.9*  ALBUMIN 3.1*   No results for input(s): LIPASE, AMYLASE in the last 168 hours. No results for input(s): AMMONIA in the last 168 hours. Coagulation Profile: No results for input(s): INR, PROTIME in the last 168 hours. Cardiac Enzymes:  Recent Labs Lab 12/01/16 1920 12/02/16 0041 12/02/16 0718  TROPONINI <0.03 <0.03 <0.03   BNP (last 3 results) No results for input(s): PROBNP in the last 8760 hours. HbA1C: No results for input(s): HGBA1C in the last 72 hours. CBG:  Recent Labs Lab 12/01/16 1221 12/02/16 0637  GLUCAP 95 86   Lipid Profile: No results for input(s): CHOL, HDL, LDLCALC, TRIG, CHOLHDL, LDLDIRECT in the last 72 hours. Thyroid Function Tests: No results for input(s): TSH, T4TOTAL, FREET4, T3FREE, THYROIDAB in the last 72 hours. Anemia Panel: No results for input(s): VITAMINB12, FOLATE, FERRITIN, TIBC, IRON, RETICCTPCT in the last 72 hours. Urine analysis: No results found for: COLORURINE, APPEARANCEUR, LABSPEC, PHURINE, GLUCOSEU, HGBUR, BILIRUBINUR, KETONESUR, PROTEINUR, UROBILINOGEN, NITRITE, LEUKOCYTESUR   ) Recent Results (from the past 240 hour(s))  MRSA PCR Screening     Status: None   Collection Time: 12/01/16  8:00 PM  Result Value Ref Range Status   MRSA by PCR NEGATIVE NEGATIVE Final    Comment:        The GeneXpert MRSA Assay (FDA approved for NASAL specimens only), is one component of a comprehensive MRSA colonization surveillance program. It is not intended to diagnose MRSA infection nor to guide or monitor treatment for MRSA infections.       Anti-infectives    None       Radiology Studies: Ct Head Wo Contrast  Result Date:  12/01/2016 CLINICAL DATA:  51 year old presenting with acute mental status changes, becoming unresponsive at the nursing home and unable to answer questions thereafter personal history of stroke. EXAM: CT HEAD WITHOUT CONTRAST TECHNIQUE: Contiguous axial images were obtained from the base of the skull through the vertex without intravenous contrast. COMPARISON:  None. FINDINGS: Motion blurred several of the images during the examination but the study appears diagnostic. Brain: Head tilt in the gantry accounts for apparent asymmetry. Encephalomalacia involving the left occipital lobe and the left posterior temporal lobe. Low attenuation involving pons. Ventricular system normal in size and appearance for age. Severe changes of small vessel disease of the white matter diffusely No mass lesion. No midline shift. No acute hemorrhage or hematoma. No extra-axial fluid collections. No evidence of acute infarction. Vascular:  Mild bilateral carotid siphon and left vertebral artery atherosclerosis. Atretic right vertebral artery. No hyperdense vessel. Skull: No skull fracture or other focal osseous abnormality involving the skull. Sinuses/Orbits: Visualized paranasal sinuses, bilateral mastoid air cells and bilateral middle ear cavities well-aerated. Visualized orbits and globes are normal. Other: None. IMPRESSION: 1. No acute intracranial abnormality. 2. Remote cortical stroke involving the left occipital lobe and left superior temporal lobe (left posterior cerebral artery distribution). 3. Remote stroke involving the pons. 4. Severe chronic microvascular ischemic changes involving the white matter. Electronically Signed   By: Hulan Saas M.D.   On: 12/01/2016 14:35   Ct Angio Chest Pe W And/or Wo Contrast  Result Date: 12/01/2016 CLINICAL DATA:  High probability of pulmonary embolism EXAM: CT ANGIOGRAPHY CHEST WITH CONTRAST TECHNIQUE: Multidetector CT imaging of the chest was performed using the standard protocol  during bolus administration of intravenous contrast. Multiplanar CT image reconstructions and MIPs were obtained to evaluate the vascular anatomy. CONTRAST:  100 cc Isovue 370 COMPARISON:  Chest x-ray of 12/01/2016 FINDINGS: Cardiovascular: The heart is mildly enlarged. No pericardial effusion is seen. No significant coronary artery calcifications are evident. The mid ascending thoracic aorta measures 41 mm in diameter. Recommend annual imaging followup by CTA or MRA. This recommendation follows 2010 ACCF/AHA/AATS/ACR/ASA/SCA/SCAI/SIR/STS/SVM Guidelines for the Diagnosis and Management of Patients with Thoracic Aortic Disease. Circulation. 2010; 121: J191-Y782. The pulmonary artery opacified well. No evidence of acute pulmonary embolism is seen. Linear artifacts do obscure some detail within the distal branches to the lower lobes. Mediastinum/Nodes: There may be a small hiatal hernia present. No mediastinal or hilar adenopathy is seen. The thyroid gland is unremarkable. Lungs/Pleura: On lung window images, no pneumonia is seen and there is no evidence of pleural effusion. No suspicious lung nodule is noted. The central airway is patent. Upper Abdomen: Images through the upper abdomen show no significant abnormality. The stomach is relatively distended with fluid. Musculoskeletal: There are mild degenerative changes throughout the thoracic spine diffusely. No compression deformity is seen. Review of the MIP images confirms the above findings. IMPRESSION: 1. No evidence of acute pulmonary embolism is seen. 2. Fusiform dilatation of the ascending thoracic aorta measuring up to 41 mm in diameter. Recommend annual imaging followup by CTA or MRA. This recommendation follows 2010 ACCF/AHA/AATS/ACR/ASA/SCA/SCAI/SIR/STS/SVM Guidelines for the Diagnosis and Management of Patients with Thoracic Aortic Disease. Circulation. 2010; 121: N562-Z308 3. Possible small hiatal hernia. Electronically Signed   By: Dwyane Dee M.D.   On:  12/01/2016 16:23   Dg Chest Portable 1 View  Result Date: 12/01/2016 CLINICAL DATA:  Status post CVA last month with persistent right-sided weakness. Patient was found unresponsive in the wheelchair this morning without apparent cause. The patient underwent CPR for 3 minutes. EXAM: PORTABLE CHEST 1 VIEW COMPARISON:  None in PACs FINDINGS: The lungs are adequately inflated and clear. The heart is mildly enlarged. The pulmonary vascularity is normal. The mediastinum is normal in width. The bony thorax is unremarkable. External pacemaker defibrillator pads are present. IMPRESSION: Mild cardiomegaly without pulmonary edema. No acute cardiopulmonary abnormality. Electronically Signed   By: David  [place] M.D.   On: 12/01/2016 12:41        Scheduled Meds: . aspirin  325 mg Per Tube Daily  . atorvastatin  10 mg Oral q1800  . FLUoxetine  20 mg Per Tube Daily  . heparin  5,000 Units Subcutaneous Q8H  . risperiDONE  1 mg Per Tube QHS  . sodium chloride flush  3 mL Intravenous  Q12H  . tiZANidine  2 mg Oral BID   Continuous Infusions: . sodium chloride 75 mL/hr at 12/01/16 2029  . sodium chloride 75 mL/hr at 12/02/16 0955     LOS: 0 days    Time spent: 35 min    Myrikal Messmer U Rudie Sermons, DO [facility] Pager [phone]  If 7PM-7AM, please contact night-coverage [email or web address] Password [facility] 12/02/2016, 2:23 PM

## 2016-12-02 NOTE — Progress Notes (Signed)
EEG Completed; Results Pending  

## 2016-12-02 NOTE — Procedures (Signed)
History: 51 year old male being evaluated for an unresponsive episode  Sedation: None  Technique: This is a 21 channel routine scalp EEG performed at the bedside with bipolar and monopolar montages arranged in accordance to the international 10/20 system of electrode placement. One channel was dedicated to EKG recording.    Background: The background consists of intermixed alpha and beta activities. There is a well defined posterior dominant rhythm of 8 Hz that attenuates with eye opening. Sleep is not recorded.   Photic stimulation: Physiologic driving is not performed  EEG Abnormalities: None  Clinical Interpretation: This normal EEG is recorded in the waking state. There was no seizure or seizure predisposition recorded on this study. Please note that a normal EEG does not preclude the possibility of epilepsy.   Ritta Slot, MD [facility] [phone]  If 7pm- 7am, please page neurology on call as listed in AMION.

## 2016-12-03 LAB — BASIC METABOLIC PANEL
Anion gap: 7 (ref 5–15)
BUN: 7 mg/dL (ref 6–20)
CO2: 27 mmol/L (ref 22–32)
CREATININE: 0.78 mg/dL (ref 0.61–1.24)
Calcium: 9.2 mg/dL (ref 8.9–10.3)
Chloride: 105 mmol/L (ref 101–111)
GFR calc Af Amer: >60 mL/min (ref >60)
Glucose, Bld: 85 mg/dL (ref 65–99)
POTASSIUM: 3.5 mmol/L (ref 3.5–5.1)
SODIUM: 139 mmol/L (ref 135–145)

## 2016-12-03 LAB — CBC
HCT: 35.6 % — ABNORMAL LOW (ref 39.0–52.0)
Hemoglobin: 12.1 g/dL — ABNORMAL LOW (ref 13.0–17.0)
MCH: 29.2 pg (ref 26.0–34.0)
MCHC: 34 g/dL (ref 30.0–36.0)
MCV: 86 fL (ref 78.0–100.0)
PLATELETS: 143 10*3/uL — AB (ref 150–400)
RBC: 4.14 MIL/uL — AB (ref 4.22–5.81)
RDW: 13.4 % (ref 11.5–15.5)
WBC: 2.8 10*3/uL — ABNORMAL LOW (ref 4.0–10.5)

## 2016-12-03 LAB — GLUCOSE, CAPILLARY: Glucose-Capillary: 83 mg/dL (ref 65–99)

## 2016-12-03 MED ORDER — JEVITY 1.5 CAL/FIBER PO LIQD: 237.0000 mL | Freq: Three times a day (TID) | ORAL | 0 refills | Status: DC | PRN

## 2016-12-03 MED ORDER — AMLODIPINE BESYLATE 10 MG PO TABS
10.0000 mg | ORAL_TABLET | Freq: Every day | ORAL | Status: DC
  Administered 2016-12-03: 10 mg via ORAL
  Filled 2016-12-03: qty 1

## 2016-12-03 MED ORDER — AMLODIPINE BESYLATE 10 MG PO TABS: 10.0000 mg | ORAL_TABLET | Freq: Every day | ORAL | 0 refills | Status: DC

## 2016-12-03 MED ORDER — HYDRALAZINE HCL 25 MG PO TABS: 25.0000 mg | ORAL_TABLET | Freq: Two times a day (BID) | ORAL | 0 refills | Status: DC

## 2016-12-03 MED ORDER — METOPROLOL TARTRATE 25 MG PO TABS: 25.0000 mg | ORAL_TABLET | Freq: Two times a day (BID) | ORAL | Status: DC

## 2016-12-03 MED ORDER — METOPROLOL TARTRATE 25 MG PO TABS: 25.0000 mg | ORAL_TABLET | Freq: Two times a day (BID) | ORAL | 0 refills | Status: DC

## 2016-12-03 NOTE — NC FL2 (Signed)
[place] MEDICAID FL2 LEVEL OF CARE SCREENING TOOL     IDENTIFICATION  Patient Name: Jeremy Brennan Birthdate: January 16, 1966 Sex: male Admission Date (Current Location): 12/01/2016  [facility] and [place] Number:  Producer, television/film/video and Address:  [facility] [place], [address] [place], [place] [address]      Provider Number: [id number]  Attending Physician Name and Address:  Coralie Keens  Relative Name and Phone Number:       Current Level of Care: Hospital Recommended Level of Care: Skilled Nursing Facility Prior Approval Number:    Date Approved/Denied:   PASRR Number: [id number]  Discharge Plan: SNF    Current Diagnoses: Patient Active Problem List   Diagnosis Date Noted  . Chronic cerebrovascular accident (CVA) 12/01/2016  . Depression with suicidal ideation 12/01/2016  . Sudden cardiac arrest (HCC) 12/01/2016  . Unresponsive 12/01/2016  . Bradycardia 12/01/2016  . Hemiparesis affecting right side as late effect of stroke (HCC) 11/01/2016  . Contracture of joint of left hand 11/01/2016  . History of CVA (cerebrovascular accident) 11/01/2016  . Essential hypertension 11/01/2016  . Depression, recurrent (HCC) 11/01/2016  . Mixed hyperlipidemia 11/01/2016  . Dysphagia 11/01/2016  . S/P percutaneous endoscopic gastrostomy (PEG) tube placement (HCC) 11/01/2016  . Dysarthria 11/01/2016    Orientation RESPIRATION BLADDER Height & Weight     Self  Normal Incontinent, External catheter Weight: 191 lb (86.6 kg) Height:   (165.1 cm)  BEHAVIORAL SYMPTOMS/MOOD NEUROLOGICAL BOWEL NUTRITION STATUS   (None)  (History of CVA) Continent Diet, Feeding tube (DYS 2. Peg.)  AMBULATORY STATUS COMMUNICATION OF NEEDS Skin   Limited Assist Verbally Normal                       Personal Care Assistance Level of Assistance  Bathing, Feeding, Dressing Bathing Assistance: Maximum assistance Feeding assistance: Maximum  assistance Dressing Assistance: Maximum assistance     Functional Limitations Info  Sight, Hearing, Speech Sight Info: Adequate Hearing Info: Adequate Speech Info: Adequate    SPECIAL CARE FACTORS FREQUENCY  PT (By licensed PT), Blood pressure, OT (By licensed OT)     PT Frequency: 5 x week OT Frequency: 5 x week            Contractures Contractures Info: Present    Additional Factors Info  Code Status, Allergies, Psychotropic Code Status Info: Full Allergies Info: NKDA Psychotropic Info: Depression: Prozac 20 mg PER TUBE daily, Risperdal 1 mg PER TUBE QHS         Current Medications (12/03/2016):  This is the current hospital active medication list Current Facility-Administered Medications  Medication Dose Route Frequency Provider Last Rate Last Dose  . 0.45 % sodium chloride infusion   Intravenous Continuous Ozella Rocks, MD 75 mL/hr at 12/02/16 2203    . amLODipine (NORVASC) tablet 10 mg  10 mg Oral Daily Arrien, York Ram, MD      . aspirin tablet 325 mg  325 mg Per Tube Daily Ozella Rocks, MD   325 mg at 12/03/16 1041  . atorvastatin (LIPITOR) tablet 20 mg  20 mg Oral q1800 Vann, Jessica U, DO   20 mg at 12/02/16 1724  . feeding supplement (JEVITY 1.5 CAL/FIBER) liquid 237 mL  237 mL Per Tube TID WC PRN Benjamine Mola, Jessica U, DO      . FLUoxetine (PROZAC) capsule 20 mg  20 mg Per Tube Daily Ozella Rocks, MD   20 mg at  12/03/16 1042  . heparin injection 5,000 Units  5,000 Units Subcutaneous Q8H Ozella Rocks, MD   5,000 Units at 12/03/16 [phone]  . ondansetron (ZOFRAN) tablet 4 mg  4 mg Oral Q6H PRN Ozella Rocks, MD       Or  . ondansetron [facility]) injection 4 mg  4 mg Intravenous Q6H PRN Ozella Rocks, MD      . risperiDONE (RISPERDAL) tablet 1 mg  1 mg Per Tube QHS Ozella Rocks, MD   1 mg at 12/02/16 2155  . sodium chloride flush (NS) 0.9 % injection 3 mL  3 mL Intravenous Q12H Ozella Rocks, MD   3 mL at 12/03/16 1042  . tiZANidine  (ZANAFLEX) tablet 2 mg  2 mg Oral BID Ozella Rocks, MD   2 mg at 12/03/16 1042     Discharge Medications: Please see discharge summary for a list of discharge medications.  Relevant Imaging Results:  Relevant Lab Results:   Additional Information SS#: [id number]  Margarito Liner, LCSW

## 2016-12-03 NOTE — Progress Notes (Signed)
Echocardiogram reviewed. Cardiology will sign off. Will set up outpatient appointment for event monitor

## 2016-12-03 NOTE — Evaluation (Signed)
Physical Therapy Evaluation Patient Details Name: Jeremy Brennan MRN: [id number] DOB: 05/28/1965 Today's Date: 12/03/2016   History of Present Illness  Pt is a 51 y.o. male who was brought to the ED after being found unresponsive at SNF and requiring approximately 3 minutes of CPR. Bradycardia following CPR. PMH significant for hypertension, hyperlipidemia, arthritis, dementia, depression, CVA with significant neuro deficits, and s/p PEG tube.  Clinical Impression  Pt admitted with above diagnosis. Pt currently with functional limitations due to the deficits listed below (see PT Problem List). Pt is currently modAx2 for bed mobility, transfers and stepping 2 feet to recliner. Pt is limited by R sided weakness and fatigue. Pt will benefit from skilled PT to increase their independence and safety with mobility to allow discharge to the venue listed below.       Follow Up Recommendations SNF    Equipment Recommendations  None recommended by PT    Recommendations for Other Services       Precautions / Restrictions Precautions Precautions: Fall Precaution Comments: R sided weakness from CVA,  L hand contracture Restrictions Weight Bearing Restrictions: No      Mobility  Bed Mobility Overal bed mobility: Needs Assistance Bed Mobility: Supine to Sit     Supine to sit: Mod assist;+2 for physical assistance     General bed mobility comments: Pt able to initiate with L LE and sit upright in bed, required modAx2 for pad scoot of hips to EoB and management of LEs off of bed to floor  Transfers Overall transfer level: Needs assistance Equipment used: 2 person hand held assist Transfers: Sit to/from Stand Sit to Stand: Mod assist;+2 physical assistance         General transfer comment: modAx2 for power up and steading with blocking of R knee for slight buckling, no complaints of dizziness able to stand with assist for 20 sec before stepping to  chair  Ambulation/Gait Ambulation/Gait assistance: Mod assist;+2 physical assistance Ambulation Distance (Feet): 2 Feet Assistive device: 2 person hand held assist Gait Pattern/deviations: Step-to pattern;Decreased step length - right;Decreased step length - left;Shuffle Gait velocity: slowed Gait velocity interpretation: Below normal speed for age/gender General Gait Details: modAx2 for 6 steps to chair, R knee blocked with each step to prevent buckling, vc for stepping       Balance Overall balance assessment: Needs assistance Sitting-balance support: No upper extremity supported;Feet supported Sitting balance-Leahy Scale: Poor     Standing balance support: Bilateral upper extremity supported;No upper extremity supported Standing balance-Leahy Scale: Poor                               Pertinent Vitals/Pain Pain Assessment: 0-10 Pain Score: 9  Pain Location: Right side  Pain Descriptors / Indicators: Aching Pain Intervention(s): Limited activity within patient's tolerance;Monitored during session;Repositioned    Home Living Family/patient expects to be discharged to:: Skilled nursing facility                 Additional Comments: Has been at [facility] for rehabilitation. Reports working on standing and ambulating with assistance.     Prior Function Level of Independence: Needs assistance   Gait / Transfers Assistance Needed: Working on ambulating with assistance at SNF.   ADL's / Homemaking Assistance Needed: Pt requires assistance for all ADL but reports working to improve independence at rehab.        Hand Dominance        Extremity/Trunk  Assessment   Upper Extremity Assessment Upper Extremity Assessment: Defer to OT evaluation RUE Deficits / Details: Able to complete approximately 0-20 degrees forward flexion at shoulder. Minimal movement at R elbow and wrist. Brunnstrom level II.  LUE Deficits / Details: L hand contracture. Pain with  attempted PROM. AROM WFL at elbow and shoulder with 3/5 strength.     Lower Extremity Assessment Lower Extremity Assessment: RLE deficits/detail RLE Deficits / Details: strength grossly 2/5, R knee lacking 5 degrees of extension, hip lacking 5 degrees extension,  RLE Sensation: decreased light touch       Communication   Communication: No difficulties  Cognition Arousal/Alertness: Awake/alert Behavior During Therapy: Flat affect Overall Cognitive Status: History of cognitive impairments - at baseline                                 General Comments: Able to follow one-step commands. Does have history of cognitive deficits from previous CVA. Noted slow processing with increased time to respond.       General Comments General comments (skin integrity, edema, etc.): VSS, condom catheter leaking nurse tech notified         Assessment/Plan    PT Assessment Patient needs continued PT services  PT Problem List Decreased strength;Decreased range of motion;Decreased activity tolerance;Decreased balance;Decreased mobility;Decreased coordination;Decreased safety awareness;Impaired sensation;Pain       PT Treatment Interventions DME instruction;Gait training;Functional mobility training;Therapeutic activities;Therapeutic exercise;Balance training;Neuromuscular re-education;Patient/family education;Cognitive remediation    PT Goals (Current goals can be found in the Care Plan section)  Acute Rehab PT Goals Patient Stated Goal: back to rehab PT Goal Formulation: With patient Time For Goal Achievement: 12/17/16 Potential to Achieve Goals: Fair    Frequency Min 2X/week    AM-PAC PT "6 Clicks" Daily Activity  Outcome Measure Difficulty turning over in bed (including adjusting bedclothes, sheets and blankets)?: Unable Difficulty moving from lying on back to sitting on the side of the bed? : Unable Difficulty sitting down on and standing up from a chair with arms (e.g.,  wheelchair, bedside commode, etc,.)?: Unable Help needed moving to and from a bed to chair (including a wheelchair)?: A Lot Help needed walking in hospital room?: Total Help needed climbing 3-5 steps with a railing? : Total 6 Click Score: 7    End of Session Equipment Utilized During Treatment: Gait belt Activity Tolerance: Patient tolerated treatment well Patient left: in chair;with call bell/phone within reach;with chair alarm set;with nursing/sitter in room Nurse Communication: Mobility status;Need for lift equipment PT Visit Diagnosis: Unsteadiness on feet (R26.81);Other abnormalities of gait and mobility (R26.89);History of falling (Z91.81);Muscle weakness (generalized) (M62.81);Difficulty in walking, not elsewhere classified (R26.2);Hemiplegia and hemiparesis;Pain Hemiplegia - Right/Left: Right Hemiplegia - caused by: Cerebral infarction Pain - Right/Left: Right Pain - part of body:  (entire side)    Time: 1610-9604 PT Time Calculation (min) (ACUTE ONLY): 21 min   Charges:   PT Evaluation $PT Eval Moderate Complexity: 1 Mod     PT G Codes:        Kayveon Lennartz B. Beverely Risen PT, DPT Acute Rehabilitation  [phone] Pager [phone]    Elon Alas Fleet 12/03/2016, 9:48 AM

## 2016-12-03 NOTE — Progress Notes (Signed)
  Speech Language Pathology Treatment: Dysphagia  Patient Details Name: Jeremy Brennan MRN: [id number] DOB: 1965/06/09 Today's Date: 12/03/2016 Time: 6948-5462 SLP Time Calculation (min) (ACUTE ONLY): 11 min  Assessment / Plan / Recommendation Clinical Impression  Pt's swallow subjectively improved since BSE on 9/26; pt tolerated consecutive sips of thin liquid via straw and solid consistency with no overt s/sx of aspiration however prolonged oral transit and multiple swallows still persit. NT reports that pt has been doing well with meals with occasional coughing on Dys 2 consistencies and is tolerating thin liquids with no difficulty. SLP suspects possible intermittent oral holding that pt clears given minimal verbal cueing. Recommend continuation of Dys 2 diet with thin liquids; small bites/sips, intermittent checks for buccal pocketing and liquid wash following bites suggested as a precaution. Pt appears to be at baseline. No SLP f/u needed. Will sign off.    HPI HPI: Pt is a 51 y.o.malewith medical history significant of CVA with residual deficits, hypertension, hyperlipidemia, depression, dementia. Patient presents by EMS staff after being transported from [facility] following unresponsive state. Pt had PEG placed on 07/01/16. Chest CT is clear. Head CT shows remote cortical stroke involving the left occipital lobe and left superior temporal lobe and remote stroke involving the pons.      SLP Plan  All goals met       Recommendations  Diet recommendations: Dysphagia 2 (fine chop);Thin liquid Liquids provided via: Cup;Straw Medication Administration: Whole meds with puree Supervision: Intermittent supervision to cue for compensatory strategies Compensations: Slow rate;Small sips/bites;Lingual sweep for clearance of pocketing;Follow solids with liquid Postural Changes and/or Swallow Maneuvers: Seated upright 90 degrees                Oral Care Recommendations: Oral care  BID Follow up Recommendations: Skilled Nursing facility SLP Visit Diagnosis: Dysphagia, unspecified (R13.10) Plan: All goals met       [place], Student SLP 12/03/2016, 10:14 AM

## 2016-12-03 NOTE — Clinical Social Work Note (Signed)
CSW facilitated patient discharge including contacting patient family and facility to confirm patient discharge plans. Clinical information faxed to facility and family agreeable with plan. CSW arranged ambulance transport via [facility] to [facility] at 3:00 pm. RN to call report prior to discharge ([phone]).  CSW will sign off for now as social work intervention is no longer needed. Please consult us again if new needs arise.  Colm Lyford, CSW [phone]   

## 2016-12-03 NOTE — Discharge Summary (Addendum)
Physician Discharge Summary  LEV CERVONE UEA:[id number] DOB: 09/08/1965 DOA: 12/01/2016  PCP: Kirt Boys, DO  Admit date: 12/01/2016 Discharge date: 12/03/2016  Admitted From: SNF Disposition:  SNF  Recommendations for Outpatient Follow-up:  1. Follow up with PCP in 1- week 2. Metoprolol was discontinued due to bradycardia  Home Health: No Equipment/Devices: No   Discharge Condition: Stable CODE STATUS: Full  Diet recommendation: Cardiac prudent diet   Brief/Interim Summary: 51 year old male who presented after an episode of being unresponsive. Patient does have the significant past medical history of CVA, hypertension, depression and dementia. On admission patient was unable to give detailed history. Apparently patient had been on a skilled nursing facility, recovering from a recent stroke. On the day of admission he was found down, unresponsive and pulseless, he received about 3 minutes of CPR, with the recovery of spontaneous circulation. Blood pressure was 80 systolic, his glucose was normal. He was transferred to the hospital for further evaluation. On initial physical examination, blood pressure 114/86, heart rate 78, respiratory rate 14, oxygen saturation 98%. He had moist mucous membranes, his lungs were clear to auscultation bilaterally, no wheezes rales or rhonchi, heart S1-S2 present rhythmic, positive 2/6 systolic murmur, abdomen was soft nontender, no lower extremity edema. Positive right hemiparesis, with slow speech. Sodium 138, potassium 3.7, chloride 106 bicarbonate 27, glucose 99, BUN 17, creatinine 1.04, white count 2.8, hemoglobin 11.1, hematocrit 33.6, platelets 131. Head CT with no acute abnormalities, remote CVA involving the left occipital lobe and left superior temporal lobe, remote stroke involving the pons, with severe chronic microvascular ischemic changes involving white matter. Chest film with right rotation, hypoinflation, no effusions, infiltrates or  pneumothorax. EKG normal sinus rhythm, normal axis, normal intervals.   Patient was admitted to the hospital with the working diagnosis episode of unresponsiveness/ metabolic encephalopathy.  1. Collapse/ transient encephalopathy. Patient was admitted to the medical unit, he was placed on a remote telemetry monitor, frequent neuro checks. Patient remained stable, no further episodes of collapse. Further workup included echocardiography which showed normal LV function with left ventricular ejection fraction 50% no significant valvular abnormalities, telemetry remained sinus rhythm, electroencephalography was negative for seizures. Patient will follow-up with cardiology as outpatient for an event monitor. Unclear the origin of collapse, certainly patient does have high risk of developing seizures related to his extensive past cerebrovascular accident. Will recommend to continue close monitoring, if recurrent collapsing episodes, will need neurology evaluation and consideration of prophylactic antiseizure medications.   2. Extensive cerebrovascular accident with right hemiparesis. Patient will return to the skilled nursing facility for rehabilitation, continue atorvastatin and aspirin. Continue blood pressure control and physical therapy.  3. Hypertension. Remained well-controlled, continue amlodipine, hydralazine and metoprolol.  4. Depression. No confusion or agitation, continue fluoxetine, risperidone, and tizanidin.   Discharge Diagnoses:  Active Problems:   Hemiparesis affecting right side as late effect of stroke [facility])   Essential hypertension   Dysphagia   S/P percutaneous endoscopic gastrostomy (PEG) tube placement (HCC)   Unresponsive   Bradycardia    Discharge Instructions   Allergies as of 12/03/2016   No Known Allergies     Medication List    TAKE these medications   amLODipine 10 MG tablet Commonly known as:  NORVASC Take 1 tablet (10 mg total) by mouth daily.   aspirin  325 MG tablet Place 325 mg into feeding tube daily.   atorvastatin 10 MG tablet Commonly known as:  LIPITOR Place 20 mg into feeding tube at bedtime.  feeding supplement (JEVITY 1.5 CAL/FIBER) Liqd Place 237 mLs into feeding tube 3 (three) times daily with meals as needed (Administer 1 can if po intake of meal is <50%).   FLUoxetine 20 MG capsule Commonly known as:  PROZAC Place 20 mg into feeding tube daily.   hydrALAZINE 25 MG tablet Commonly known as:  APRESOLINE Take 1 tablet (25 mg total) by mouth 2 (two) times daily.   risperiDONE 1 MG tablet Commonly known as:  RISPERDAL Place 1 mg into feeding tube at bedtime.   tizanidine 2 MG capsule Commonly known as:  ZANAFLEX Place 2 mg into feeding tube 2 (two) times daily.            Discharge Care Instructions        Start     Ordered   12/03/16 0000  Nutritional Supplements (FEEDING SUPPLEMENT, JEVITY 1.5 CAL/FIBER,) LIQD  3 times daily with meals PRN     12/03/16 1056   12/03/16 0000  Increase activity slowly     12/03/16 1056   12/03/16 0000  Diet - low sodium heart healthy     12/03/16 1056   12/03/16 0000  Discharge instructions    Comments:  Please follow up with primary care in 7 days.   12/03/16 1056   12/03/16 0000  amLODipine (NORVASC) 10 MG tablet  Daily     12/03/16 1056   12/03/16 0000  hydrALAZINE (APRESOLINE) 25 MG tablet  2 times daily     12/03/16 1056      No Known Allergies  Consultations:  Cardiology    Procedures/Studies: Ct Head Wo Contrast  Result Date: 12/01/2016 CLINICAL DATA:  52 year old presenting with acute mental status changes, becoming unresponsive at the nursing home and unable to answer questions thereafter personal history of stroke. EXAM: CT HEAD WITHOUT CONTRAST TECHNIQUE: Contiguous axial images were obtained from the base of the skull through the vertex without intravenous contrast. COMPARISON:  None. FINDINGS: Motion blurred several of the images during the  examination but the study appears diagnostic. Brain: Head tilt in the gantry accounts for apparent asymmetry. Encephalomalacia involving the left occipital lobe and the left posterior temporal lobe. Low attenuation involving pons. Ventricular system normal in size and appearance for age. Severe changes of small vessel disease of the white matter diffusely No mass lesion. No midline shift. No acute hemorrhage or hematoma. No extra-axial fluid collections. No evidence of acute infarction. Vascular: Mild bilateral carotid siphon and left vertebral artery atherosclerosis. Atretic right vertebral artery. No hyperdense vessel. Skull: No skull fracture or other focal osseous abnormality involving the skull. Sinuses/Orbits: Visualized paranasal sinuses, bilateral mastoid air cells and bilateral middle ear cavities well-aerated. Visualized orbits and globes are normal. Other: None. IMPRESSION: 1. No acute intracranial abnormality. 2. Remote cortical stroke involving the left occipital lobe and left superior temporal lobe (left posterior cerebral artery distribution). 3. Remote stroke involving the pons. 4. Severe chronic microvascular ischemic changes involving the white matter. Electronically Signed   By: Hulan Saas M.D.   On: 12/01/2016 14:35   Ct Angio Chest Pe W And/or Wo Contrast  Result Date: 12/01/2016 CLINICAL DATA:  High probability of pulmonary embolism EXAM: CT ANGIOGRAPHY CHEST WITH CONTRAST TECHNIQUE: Multidetector CT imaging of the chest was performed using the standard protocol during bolus administration of intravenous contrast. Multiplanar CT image reconstructions and MIPs were obtained to evaluate the vascular anatomy. CONTRAST:  100 cc Isovue 370 COMPARISON:  Chest x-ray of 12/01/2016 FINDINGS: Cardiovascular: The heart is mildly  enlarged. No pericardial effusion is seen. No significant coronary artery calcifications are evident. The mid ascending thoracic aorta measures 41 mm in diameter.  Recommend annual imaging followup by CTA or MRA. This recommendation follows 2010 ACCF/AHA/AATS/ACR/ASA/SCA/SCAI/SIR/STS/SVM Guidelines for the Diagnosis and Management of Patients with Thoracic Aortic Disease. Circulation. 2010; 121: W098-J191. The pulmonary artery opacified well. No evidence of acute pulmonary embolism is seen. Linear artifacts do obscure some detail within the distal branches to the lower lobes. Mediastinum/Nodes: There may be a small hiatal hernia present. No mediastinal or hilar adenopathy is seen. The thyroid gland is unremarkable. Lungs/Pleura: On lung window images, no pneumonia is seen and there is no evidence of pleural effusion. No suspicious lung nodule is noted. The central airway is patent. Upper Abdomen: Images through the upper abdomen show no significant abnormality. The stomach is relatively distended with fluid. Musculoskeletal: There are mild degenerative changes throughout the thoracic spine diffusely. No compression deformity is seen. Review of the MIP images confirms the above findings. IMPRESSION: 1. No evidence of acute pulmonary embolism is seen. 2. Fusiform dilatation of the ascending thoracic aorta measuring up to 41 mm in diameter. Recommend annual imaging followup by CTA or MRA. This recommendation follows 2010 ACCF/AHA/AATS/ACR/ASA/SCA/SCAI/SIR/STS/SVM Guidelines for the Diagnosis and Management of Patients with Thoracic Aortic Disease. Circulation. 2010; 121: Y782-N562 3. Possible small hiatal hernia. Electronically Signed   By: Dwyane Dee M.D.   On: 12/01/2016 16:23   Dg Chest Portable 1 View  Result Date: 12/01/2016 CLINICAL DATA:  Status post CVA last month with persistent right-sided weakness. Patient was found unresponsive in the wheelchair this morning without apparent cause. The patient underwent CPR for 3 minutes. EXAM: PORTABLE CHEST 1 VIEW COMPARISON:  None in PACs FINDINGS: The lungs are adequately inflated and clear. The heart is mildly enlarged. The  pulmonary vascularity is normal. The mediastinum is normal in width. The bony thorax is unremarkable. External pacemaker defibrillator pads are present. IMPRESSION: Mild cardiomegaly without pulmonary edema. No acute cardiopulmonary abnormality. Electronically Signed   By: David  [place] M.D.   On: 12/01/2016 12:41       Subjective: Patient awake and alert, slow to respond to questions, no confusion or agitation. No nausea or vomiting, no chest pain or dyspnea.   Discharge Exam: Vitals:   12/03/16 0024 12/03/16 0440  BP: 135/84 (!) 153/91  Pulse: 87 90  Resp: 18 18  Temp: 98.6 F (37 C) 98.7 F (37.1 C)  SpO2: 98% 100%   Vitals:   12/02/16 1231 12/02/16 2004 12/03/16 0024 12/03/16 0440  BP: 106/71 128/85 135/84 (!) 153/91  Pulse: 88 83 87 90  Resp: Temp: 97.9 F (36.6 C) 99.8 F (37.7 C) 98.6 F (37 C) 98.7 F (37.1 C)  TempSrc: Oral Oral Oral Oral  SpO2: 100% 96% 98% 100%  Weight:    86.6 kg (191 lb)  Height:        General: Pt is alert, awake, not in acute distress E ENT. Mild pallor, oral mucosa moist.  Cardiovascular: RRR, S1/S2 +, no rubs, no gallops Respiratory: CTA bilaterally, no wheezing, no rhonchi Abdominal: Soft, NT, ND, bowel sounds + Extremities: no edema, no cyanosis Neurology. Right hemiparesis.     The results of significant diagnostics from this hospitalization (including imaging, microbiology, ancillary and laboratory) are listed below for reference.     Microbiology: Recent Results (from the past 240 hour(s))  MRSA PCR Screening     Status: None   Collection Time:  12/01/16  8:00 PM  Result Value Ref Range Status   MRSA by PCR NEGATIVE NEGATIVE Final    Comment:        The GeneXpert MRSA Assay (FDA approved for NASAL specimens only), is one component of a comprehensive MRSA colonization surveillance program. It is not intended to diagnose MRSA infection nor to guide or monitor treatment for MRSA infections.       Labs: BNP (last 3 results) No results for input(s): BNP in the last 8760 hours. Basic Metabolic Panel:  Recent Labs Lab 12/01/16 1211 12/02/16 0718 12/03/16 0633  NA 138 136 139  K 3.7 3.6 3.5  CL 106 107 105  CO2 GLUCOSE 99 83 85  BUN CREATININE 1.04 0.77 0.78  CALCIUM 8.9 9.0 9.2   Liver Function Tests:  Recent Labs Lab 12/01/16 1211  AST 14*  ALT 14*  ALKPHOS 32*  BILITOT 0.5  PROT 5.9*  ALBUMIN 3.1*   No results for input(s): LIPASE, AMYLASE in the last 168 hours. No results for input(s): AMMONIA in the last 168 hours. CBC:  Recent Labs Lab 12/01/16 1211 12/02/16 0718 12/03/16 0633  WBC 2.8* 2.6* 2.8*  NEUTROABS 1.5*  --   --   HGB 11.1* 11.3* 12.1*  HCT 33.6* 34.8* 35.6*  MCV 88.2 88.1 86.0  PLT 131* 134* 143*   Cardiac Enzymes:  Recent Labs Lab 12/01/16 1920 12/02/16 0041 12/02/16 0718  TROPONINI <0.03 <0.03 <0.03   BNP: Invalid input(s): POCBNP CBG:  Recent Labs Lab 12/01/16 1221 12/02/16 0637 12/03/16 0631  GLUCAP 95 86 83   D-Dimer No results for input(s): DDIMER in the last 72 hours. Hgb A1c No results for input(s): HGBA1C in the last 72 hours. Lipid Profile No results for input(s): CHOL, HDL, LDLCALC, TRIG, CHOLHDL, LDLDIRECT in the last 72 hours. Thyroid function studies No results for input(s): TSH, T4TOTAL, T3FREE, THYROIDAB in the last 72 hours.  Invalid input(s): FREET3 Anemia work up No results for input(s): VITAMINB12, FOLATE, FERRITIN, TIBC, IRON, RETICCTPCT in the last 72 hours. Urinalysis No results found for: COLORURINE, APPEARANCEUR, LABSPEC, PHURINE, GLUCOSEU, HGBUR, BILIRUBINUR, KETONESUR, PROTEINUR, UROBILINOGEN, NITRITE, LEUKOCYTESUR Sepsis Labs Invalid input(s): PROCALCITONIN,  WBC,  LACTICIDVEN Microbiology Recent Results (from the past 240 hour(s))  MRSA PCR Screening     Status: None   Collection Time: 12/01/16  8:00 PM  Result Value Ref Range Status   MRSA by PCR NEGATIVE  NEGATIVE Final    Comment:        The GeneXpert MRSA Assay (FDA approved for NASAL specimens only), is one component of a comprehensive MRSA colonization surveillance program. It is not intended to diagnose MRSA infection nor to guide or monitor treatment for MRSA infections.      Time coordinating discharge: 45 minutes  SIGNED:   Coralie Keens, MD  [facility] 12/03/2016, 10:39 AM Pager [phone]  If 7PM-7AM, please contact night-coverage [email or web address] Password [email or web address]

## 2016-12-03 NOTE — Clinical Social Work Note (Signed)
Clinical Social Work Assessment  Patient Details  Name: Jeremy Brennan MRN: [id number] Date of Birth: [birth year]  Date of referral:  12/03/16               Reason for consult:  Discharge Planning                Permission sought to share information with:  Facility Medical sales representative, Family Supports Permission granted to share information::  Yes, Verbal Permission Granted  Name::     [name]  Agency::  [facility]  Relationship::  Relative/Legal Guardian  Contact Information:  [phone]  Housing/Transportation Living arrangements for the past 2 months:  Skilled Nursing Facility Source of Information:  Medical Team, Guardian Patient Interpreter Needed:  None Criminal Activity/Legal Involvement Pertinent to Current Situation/Hospitalization:  No - Comment as needed Significant Relationships:  Other Family Members Lives with:  Facility Resident Do you feel safe going back to the place where you live?  Yes Need for family participation in patient care:  Yes (Comment)  Care giving concerns:  Patient was admitted from [facility]. PT recommending return to SNF once stable for discharge.   Social Worker assessment / plan:  Patient oriented to self only. CSW called patient's relative, [name]. CSW introduced role and explained that discharge planning would be discussed. Mr. Josetta Huddle was unaware that patient was in the hospital. He will call the facility for more information. Mr. Josetta Huddle states that he is the patient's legal guardian. He is agreeable to return to [facility] and was notified of plan for discharge today. No further concerns. CSW encouraged Mr. Josetta Huddle to contact CSW as needed. CSW will continue to follow patient and his guardian for support and facilitate discharge back to SNF today. Hospital liaison for [facility] will contact CSW when transport can be arranged.  Employment status:  Disabled (Comment on whether or not currently receiving  Disability) Insurance information:  Medicaid [place] PT Recommendations:  Skilled Nursing Facility Information / Referral to community resources:  Skilled Nursing Facility  Patient/Family's Response to care:  Patient oriented to self only. Patient's guardian agreeable to return to SNF. Patient's guardian supportive and involved in patient's care. Patient's guardian appreciated social work intervention.  Patient/Family's Understanding of and Emotional Response to Diagnosis, Current Treatment, and Prognosis:  Patient oriented to self only. Patient's guardian will call SNF for more information on why he was admitted. Patient's guardian was unaware of his hospitalization.  Emotional Assessment Appearance:  Appears stated age Attitude/Demeanor/Rapport:  Unable to Assess Affect (typically observed):  Unable to Assess Orientation:  Oriented to Self Alcohol / Substance use:  Never Used Psych involvement (Current and /or in the community):  No (Comment)  Discharge Needs  Concerns to be addressed:  Care Coordination Readmission within the last 30 days:  No Current discharge risk:  Cognitively Impaired, Dependent with Mobility Barriers to Discharge:  No Barriers Identified   Margarito Liner, LCSW 12/03/2016, 11:38 AM

## 2016-12-04 ENCOUNTER — Non-Acute Institutional Stay (SKILLED_NURSING_FACILITY): Payer: Medicaid Other | Admitting: Adult Health

## 2016-12-04 ENCOUNTER — Encounter: Payer: Self-pay | Admitting: Adult Health

## 2016-12-04 DIAGNOSIS — R45851 Suicidal ideations: Secondary | ICD-10-CM

## 2016-12-04 DIAGNOSIS — R1310 Dysphagia, unspecified: Secondary | ICD-10-CM

## 2016-12-04 DIAGNOSIS — Z931 Gastrostomy status: Secondary | ICD-10-CM

## 2016-12-04 DIAGNOSIS — F329 Major depressive disorder, single episode, unspecified: Secondary | ICD-10-CM

## 2016-12-04 DIAGNOSIS — I69351 Hemiplegia and hemiparesis following cerebral infarction affecting right dominant side: Secondary | ICD-10-CM

## 2016-12-04 DIAGNOSIS — R001 Bradycardia, unspecified: Secondary | ICD-10-CM

## 2016-12-04 DIAGNOSIS — I10 Essential (primary) hypertension: Secondary | ICD-10-CM

## 2016-12-04 DIAGNOSIS — E782 Mixed hyperlipidemia: Secondary | ICD-10-CM | POA: Diagnosis not present

## 2016-12-04 DIAGNOSIS — F32A Depression, unspecified: Secondary | ICD-10-CM

## 2016-12-04 DIAGNOSIS — I6930 Unspecified sequelae of cerebral infarction: Secondary | ICD-10-CM | POA: Diagnosis not present

## 2016-12-04 NOTE — Progress Notes (Signed)
Location:   [facility] Room Number: 26 D Place of Service:  SNF (31)   CODE STATUS: Full Code  No Known Allergies  Chief Complaint  Patient presents with  . Hospitalization Follow-up    Hospital follow up    HPI:  He has been hospitalized from 12-01-16 through 12-03-16 after a sudden cardiac arrest requiring 3 minutes of cpr. He was placed on telemetry. He had no further episodes of collapse.  His ;opressor was stopped in the hospital more than likely this does present a long term placement for him. He is unable to fully participate in the hpi or ros. There are no nursing concerns at this time.   Past Medical History:  Diagnosis Date  . Arthritis   . Contracture, left hand   . 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 Depression   . Hyperlipidemia   . Hypertension   . Stroke [facility])     Past Surgical History:  Procedure Laterality Date  . HERNIA REPAIR    . PEG TUBE PLACEMENT  07/01/2016    Social History   Social History  . Marital status: Widowed    Spouse name: N/A  . Number of children: N/A  . Years of education: N/A   Occupational History  . Not on file.   Social History Main Topics  . Smoking status: Never Smoker  . Smokeless tobacco: Never Used  . Alcohol use Not on file  . Drug use: No  . Sexual activity: Not on file   Other Topics Concern  . Not on file   Social History Narrative  . No narrative on file   Family History  Problem Relation Age of Onset  . Hypertension Other       VITAL SIGNS BP 140/90   Pulse 95   Temp 98.3 F (36.8 C)   Resp 16   Ht 6' (1.829 m)   Wt 184 lb 9.6 oz (83.7 kg)   SpO2 93% Comment: room air  BMI 25.04 kg/m   Patient's Medications  New Prescriptions   No medications on file  Previous Medications   AMLODIPINE (NORVASC) 10 MG TABLET    Take 1 tablet (10 mg total) by mouth daily.   ASPIRIN 325 MG TABLET    Place 325 mg into feeding tube daily.   ATORVASTATIN (LIPITOR) 20 MG TABLET    Place 20 mg  into feeding tube at bedtime.    FLUOXETINE (PROZAC) 20 MG CAPSULE    Place 20 mg into feeding tube daily.   HYDRALAZINE (APRESOLINE) 25 MG TABLET    Take 1 tablet (25 mg total) by mouth 2 (two) times daily.   NUTRITIONAL SUPPLEMENTS (FEEDING SUPPLEMENT, JEVITY 1.5 CAL/FIBER,) LIQD    Place 237 mLs into feeding tube 3 (three) times daily with meals as needed (Administer 1 can if po intake of meal is <50%).   RISPERIDONE (RISPERDAL) 1 MG TABLET    Place 1 mg into feeding tube at bedtime.   TIZANIDINE (ZANAFLEX) 2 MG CAPSULE    Place 2 mg into feeding tube 2 (two) times daily.  Modified Medications   No medications on file  Discontinued Medications   No medications on file     SIGNIFICANT DIAGNOSTIC EXAMS  TODAY:   12-01-16: chest x-ray: Mild cardiomegaly without pulmonary edema. No acute cardiopulmonary abnormality.  12-01-16: ct of head:  1. No acute intracranial abnormality. 2. Remote cortical stroke involving the left occipital lobe and left superior temporal lobe (left  posterior cerebral artery distribution). 3. Remote stroke involving the pons. 4. Severe chronic microvascular ischemic changes involving the white Matter.  12-01-16: ct angio of chest:  1. No evidence of acute pulmonary embolism is seen. 2. Fusiform dilatation of the ascending thoracic aorta measuring up to 41 mm in diameter. Recommend annual imaging followup by CTA or MRA.  3. Possible small hiatal hernia.  12-02-16: EEG:  EEG Abnormalities: None Clinical Interpretation: This normal EEG is recorded in the waking state. There was no seizure or seizure predisposition recorded on this study. Please note that a normal EEG does not preclude the possibility of epilepsy.    12-02-16: 2-d echo:  - Left ventricle: The cavity size was mildly dilated. Wall motion  was normal; there were no regional wall motion abnormalities. Mildly reduced global EF 50%   Doppler parameters are consistent with abnormal left ventricular  relaxation (grade 1 diastolic dysfunction). - No significant valvular abnormality - Mild dilatation of aortic root (4.1 cm) and proximal ascending aorta (3.9 cm)    LABS REVIEWED; PREVIOUS  10-26-16: wbc 4.6; hgb 13.5; hct 41.7; mcv 91.8; plt 166 glucose 100; bun 17.2; creat 0.72; k+ 4.4 ;na++ 141; liver normal albumin 3.7 hgb a1c 4.5  11-05-16: glucose 85; bun 9.3; creat 0.66; k+ 4.2; na++ 138; ca 9.7  TODAY:   12-01-16: wbc 2.8; hgb 11.1; hct 33.6; mcv 88.2. plt 131; glucose 99; bun 17; creat 1.04; k+ 3.7; na++ 138; ca 8.9; liver normal albumin 3.1 12-03-16: wbc 2.8; hgb 12.1; hct 35.6; mcv 86.0; plt 143glucose 85; bun 7; creat 0.78; k+ 3.5; na++ 139; ca 9.2     Review of Systems  Unable to perform ROS: Other (expressive aphagia )    Physical Exam  Constitutional: He appears well-developed and well-nourished. No distress.  Eyes: Conjunctivae are normal.  Neck: Normal range of motion. Neck supple. No thyromegaly present.  Cardiovascular: Normal rate, regular rhythm, normal heart sounds and intact distal pulses.   Pulmonary/Chest: Effort normal and breath sounds normal. No respiratory distress.  Abdominal: Soft. Bowel sounds are normal. He exhibits no distension. There is no tenderness.  Peg tube present without signs of infection present   Musculoskeletal: He exhibits no edema.  Right hemiparesis   Lymphadenopathy:    He has no cervical adenopathy.  Neurological: He is alert.  Skin: Skin is warm and dry. He is not diaphoretic.  Psychiatric: He has a normal mood and affect.    ASSESSMENT/ PLAN:  TODAY:  1.  Hypertension: stable b/p 140/90: will continue norvasc 10 mg daily and apresoline 25 mg twice daily   2. CVA: stable; has hemiparesis on right side with spasticity present: will continue asa 325 mg daily and zanaflex 2 mg twice daily   3. Dyslipidemia: stable will continue lipitor 20 mg daily   4. Dysphagia: stable; has peg tube he is not using; will not make changes will  monitor  5.  Bradycardia: stable pulse 95; will avoid beta blockers and will monitor   6. Depression with suicidal ideation: is stable will continue prozac 20 mg daily and risperdal 1 mg nightly       MD is aware of resident's narcotic use and is in agreement with current plan of care. We will attempt to wean resident as apropriate     Synthia Innocent NP [facility]  Contact [phone] Monday through Friday 8am- 5pm  After hours call [phone]

## 2016-12-07 ENCOUNTER — Encounter: Payer: Self-pay | Admitting: Internal Medicine

## 2016-12-07 ENCOUNTER — Non-Acute Institutional Stay (SKILLED_NURSING_FACILITY): Payer: Medicaid Other | Admitting: Internal Medicine

## 2016-12-07 DIAGNOSIS — R1310 Dysphagia, unspecified: Secondary | ICD-10-CM

## 2016-12-07 DIAGNOSIS — E782 Mixed hyperlipidemia: Secondary | ICD-10-CM

## 2016-12-07 DIAGNOSIS — M24542 Contracture, left hand: Secondary | ICD-10-CM

## 2016-12-07 DIAGNOSIS — I69351 Hemiplegia and hemiparesis following cerebral infarction affecting right dominant side: Secondary | ICD-10-CM

## 2016-12-07 DIAGNOSIS — Z8674 Personal history of sudden cardiac arrest: Secondary | ICD-10-CM | POA: Diagnosis not present

## 2016-12-07 DIAGNOSIS — Z931 Gastrostomy status: Secondary | ICD-10-CM

## 2016-12-07 DIAGNOSIS — F339 Major depressive disorder, recurrent, unspecified: Secondary | ICD-10-CM

## 2016-12-07 DIAGNOSIS — R471 Dysarthria and anarthria: Secondary | ICD-10-CM

## 2016-12-07 DIAGNOSIS — I10 Essential (primary) hypertension: Secondary | ICD-10-CM | POA: Diagnosis not present

## 2016-12-07 DIAGNOSIS — Z8673 Personal history of transient ischemic attack (TIA), and cerebral infarction without residual deficits: Secondary | ICD-10-CM | POA: Diagnosis not present

## 2016-12-07 NOTE — Progress Notes (Signed)
Patient ID: Jeremy Brennan, male   DOB: [birth year], 51 y.o.   MRN: [id number]     HISTORY AND PHYSICAL   DATE:  December 07, 2016  Location:   [facility] Room Number: 47 D Place of Service: SNF (31)   Extended Emergency Contact Information Primary Emergency Contact: Gillion,Willie Address: [address]          [place], [place] [address] [place] Phone: [phone] Relation: Relative  Advanced Directive information Would patient like information on creating a medical advance directive?: No - Patient declined  Chief Complaint  Patient presents with  . Readmit To SNF    Readmission    HPI:  51 yo male long term resident seen today as a readmission into SNF for collapse, hx CVA with right hemiparesis, transient encephalopathy, bradycardia, HTN, depression, dysphagia s/p Peg. He presented to the ED with episode of unresponsiveness and pulseless. He was given 3 min CPR-->spontaneous circulation. SBP 80. CT head showed remote CVA with left occipital lobe and left superior temporal lobe; remote stroke involving pons with severe chronic microvascular ischemic changes in white matter. CXR showed NAPD. ECG no acute ischemic changes. Metoprolol stopped 2/2 bradycardia. 2D echo revealed EF 50% with no valvular abnormalities; mild dilatation of aortic root (4.1cm) and proximal ascending aorta (3.9 cm); mild LV dialtion. WBC 2.8K; Hgb 11.1-->12.1; Plts 143K; albumin 3.1; Cr 1.04-->0.78 at d/c. He presents to SNF for short term rehab then probably long term care.  Today he has no c/o. His appetite is reduced. No falls. Sleeps well. No nursing issues. He is a poor historian due to dementia/aphasia. Hx obtained from chart.  Depression - followed by psych as o/p. Mood stable on prozac and risperdal  HTN - stable on hydralazine and amlodipine. metoprolol tartrate stopped prior to d/c 2/2 bradycardia. He takes ASA daily for anticoagulation  Hyperlipidemia - stable on  lipitor. No myalgias. No recent LDL  Lumbar DDD - pain stable off tylenol  Conjunctivitis - resolved  Hx CVA - he has RUE spasticity and distal LUE contracture and b/l hemiparesis; right homonymous hemianopsia. stable on ASA 322m daily. He has dysphagia and is s/p Peg tube. He gets TF via peg if he consumes <50% of meals  Dementia - stable. 2/2 cerebrovascular disease  Past Medical History:  Diagnosis Date  . Arthritis   . Contracture, left hand   . 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 Depression   . Hyperlipidemia   . Hypertension   . Stroke ([facility]     Past Surgical History:  Procedure Laterality Date  . HERNIA REPAIR    . PEG TUBE PLACEMENT  07/01/2016    Patient Care Team: CGildardo Cranker DO as PCP - General (Internal Medicine)  Social History   Social History  . Marital status: Widowed    Spouse name: N/A  . Number of children: N/A  . Years of education: N/A   Occupational History  . Not on file.   Social History Main Topics  . Smoking status: Never Smoker  . Smokeless tobacco: Never Used  . Alcohol use Not on file  . Drug use: No  . Sexual activity: Not on file   Other Topics Concern  . Not on file   Social History Narrative  . No narrative on file     reports that he has never smoked. He has never used smokeless tobacco. He reports that he does not use drugs. His alcohol history is  not on file.  Family History  Problem Relation Age of Onset  . Hypertension Other    Family Status  Relation Status  . Other (Not Specified)    Immunization History  Administered Date(s) Administered  . Pneumococcal-Unspecified 10/26/2016    No Known Allergies  Medications: Patient's Medications  New Prescriptions   No medications on file  Previous Medications   AMLODIPINE (NORVASC) 10 MG TABLET    Take 1 tablet (10 mg total) by mouth daily.   ASPIRIN 325 MG TABLET    Place 325 mg into feeding tube daily.   ATORVASTATIN (LIPITOR) 20 MG TABLET    Place 20 mg  into feeding tube at bedtime.    FLUOXETINE (PROZAC) 20 MG CAPSULE    Place 20 mg into feeding tube daily.   HYDRALAZINE (APRESOLINE) 25 MG TABLET    Take 1 tablet (25 mg total) by mouth 2 (two) times daily.   NUTRITIONAL SUPPLEMENTS (FEEDING SUPPLEMENT, JEVITY 1.5 CAL/FIBER,) LIQD    Place 237 mLs into feeding tube 3 (three) times daily with meals as needed (Administer 1 can if po intake of meal is <50%).   RISPERIDONE (RISPERDAL) 1 MG TABLET    Place 1 mg into feeding tube at bedtime.   TIZANIDINE (ZANAFLEX) 2 MG CAPSULE    Place 2 mg into feeding tube 2 (two) times daily.  Modified Medications   No medications on file  Discontinued Medications   No medications on file    Review of Systems  Unable to perform ROS: Other (aphasia; slurred speech)    Vitals:   12/07/16 1131  BP: 140/90  Pulse: 95  Resp: 16  Temp: 98.3 F (36.8 C)  SpO2: 93%  Weight: 184 lb 9.6 oz (83.7 kg)  Height: 6' (1.829 m)   Body mass index is 25.04 kg/m.  Physical Exam  Constitutional: He appears well-developed and well-nourished.  Lying in bed in NAD; HOB at 30 degrees  HENT:  Mouth/Throat: Oropharynx is clear and moist.  MMM; no oral thrush  Eyes: Pupils are equal, round, and reactive to light. No scleral icterus.  Neck: Neck supple. Carotid bruit is not present. No thyromegaly present.  Cardiovascular: Normal rate, regular rhythm and intact distal pulses.  Exam reveals no gallop and no friction rub.   Murmur (1/6 SEM) heard. No distal LE edema. No calf TTP  Pulmonary/Chest: Effort normal and breath sounds normal. He has no wheezes. He has no rales. He exhibits no tenderness.  Abdominal: Soft. Normal appearance and bowel sounds are normal. He exhibits no distension, no abdominal bruit, no pulsatile midline mass and no mass. There is no hepatomegaly. There is no tenderness. There is no rigidity, no rebound and no guarding. No hernia.  Peg intact and clamped with no redness or d/c at insertion site    Musculoskeletal: He exhibits edema and deformity (distal LUE contracture; RUE spasticity; FROM L>R toes ).  Lymphadenopathy:    He has no cervical adenopathy.  Neurological: He is alert. He has normal reflexes. He exhibits abnormal muscle tone. Gait abnormal.  B/l hemiparesis L>R; grip strength 3/5 b/l  Skin: Skin is warm and dry. No rash noted.  Psychiatric: He has a normal mood and affect. His behavior is normal. Judgment and thought content normal. His speech is slurred.  Soft speech     Labs reviewed: Admission on 12/01/2016, Discharged on 12/03/2016  Component Date Value Ref Range Status  . Glucose-Capillary 12/01/2016 95  65 - 99 mg/dL Final  . WBC  12/01/2016 2.8* 4.0 - 10.5 K/uL Final  . RBC 12/01/2016 3.81* 4.22 - 5.81 MIL/uL Final  . Hemoglobin 12/01/2016 11.1* 13.0 - 17.0 g/dL Final  . HCT 12/01/2016 33.6* 39.0 - 52.0 % Final  . MCV 12/01/2016 88.2  78.0 - 100.0 fL Final  . MCH 12/01/2016 29.1  26.0 - 34.0 pg Final  . MCHC 12/01/2016 33.0  30.0 - 36.0 g/dL Final  . RDW 12/01/2016 13.8  11.5 - 15.5 % Final  . Platelets 12/01/2016 131* 150 - 400 K/uL Final  . Neutrophils Relative % 12/01/2016 55  % Final  . Neutro Abs 12/01/2016 1.5* 1.7 - 7.7 K/uL Final  . Lymphocytes Relative 12/01/2016 31  % Final  . Lymphs Abs 12/01/2016 0.9  0.7 - 4.0 K/uL Final  . Monocytes Relative 12/01/2016 11  % Final  . Monocytes Absolute 12/01/2016 0.3  0.1 - 1.0 K/uL Final  . Eosinophils Relative 12/01/2016 3  % Final  . Eosinophils Absolute 12/01/2016 0.1  0.0 - 0.7 K/uL Final  . Basophils Relative 12/01/2016 0  % Final  . Basophils Absolute 12/01/2016 0.0  0.0 - 0.1 K/uL Final  . Sodium 12/01/2016 138  135 - 145 mmol/L Final  . Potassium 12/01/2016 3.7  3.5 - 5.1 mmol/L Final  . Chloride 12/01/2016 106  101 - 111 mmol/L Final  . CO2 12/01/2016 27  22 - 32 mmol/L Final  . Glucose, Bld 12/01/2016 99  65 - 99 mg/dL Final  . BUN 12/01/2016 17  6 - 20 mg/dL Final  . Creatinine, Ser  12/01/2016 1.04  0.61 - 1.24 mg/dL Final  . Calcium 12/01/2016 8.9  8.9 - 10.3 mg/dL Final  . Total Protein 12/01/2016 5.9* 6.5 - 8.1 g/dL Final  . Albumin 12/01/2016 3.1* 3.5 - 5.0 g/dL Final  . AST 12/01/2016 14* 15 - 41 U/L Final  . ALT 12/01/2016 14* 17 - 63 U/L Final  . Alkaline Phosphatase 12/01/2016 32* 38 - 126 U/L Final  . Total Bilirubin 12/01/2016 0.5  0.3 - 1.2 mg/dL Final  . GFR calc non Af Amer 12/01/2016 >60  >60 mL/min Final  . GFR calc Af Amer 12/01/2016 >60  >60 mL/min Final   Comment: (NOTE) The eGFR has been calculated using the CKD EPI equation. This calculation has not been validated in all clinical situations. eGFR's persistently <60 mL/min signify possible Chronic Kidney Disease.   . Anion gap 12/01/2016 5  5 - 15 Final  . Troponin i, poc 12/01/2016 0.01  0.00 - 0.08 ng/mL Final  . Comment 3 12/01/2016          Final   Comment: Due to the release kinetics of cTnI, a negative result within the first hours of the onset of symptoms does not rule out myocardial infarction with certainty. If myocardial infarction is still suspected, repeat the test at appropriate intervals.   Marland Kitchen HIV Screen 4th Generation wRfx 12/01/2016 Non Reactive  Non Reactive Final   Comment: (NOTE) Performed At: [facility] [address], [place] [id number] Lindon Romp MD [id number]   . Weight 12/02/2016 3044.11  oz Final  . Height 12/02/2016 65  in Final  . BP 12/02/2016 142/76  mmHg Final  . WBC 12/02/2016 2.6* 4.0 - 10.5 K/uL Final  . RBC 12/02/2016 3.95* 4.22 - 5.81 MIL/uL Final  . Hemoglobin 12/02/2016 11.3* 13.0 - 17.0 g/dL Final  . HCT 12/02/2016 34.8* 39.0 - 52.0 % Final  . MCV 12/02/2016 88.1  78.0 - 100.0  fL Final  . MCH 12/02/2016 28.6  26.0 - 34.0 pg Final  . MCHC 12/02/2016 32.5  30.0 - 36.0 g/dL Final  . RDW 12/02/2016 13.7  11.5 - 15.5 % Final  . Platelets 12/02/2016 134* 150 - 400 K/uL Final  . Sodium 12/02/2016 136  135 - 145 mmol/L Final    . Potassium 12/02/2016 3.6  3.5 - 5.1 mmol/L Final  . Chloride 12/02/2016 107  101 - 111 mmol/L Final  . CO2 12/02/2016 23  22 - 32 mmol/L Final  . Glucose, Bld 12/02/2016 83  65 - 99 mg/dL Final  . BUN 12/02/2016 11  6 - 20 mg/dL Final  . Creatinine, Ser 12/02/2016 0.77  0.61 - 1.24 mg/dL Final  . Calcium 12/02/2016 9.0  8.9 - 10.3 mg/dL Final  . GFR calc non Af Amer 12/02/2016 >60  >60 mL/min Final  . GFR calc Af Amer 12/02/2016 >60  >60 mL/min Final   Comment: (NOTE) The eGFR has been calculated using the CKD EPI equation. This calculation has not been validated in all clinical situations. eGFR's persistently <60 mL/min signify possible Chronic Kidney Disease.   . Anion gap 12/02/2016 6  5 - 15 Final  . Troponin I 12/01/2016 <0.03  <0.03 ng/mL Final  . Troponin I 12/02/2016 <0.03  <0.03 ng/mL Final  . Troponin I 12/02/2016 <0.03  <0.03 ng/mL Final  . MRSA by PCR 12/01/2016 NEGATIVE  NEGATIVE Final   Comment:        The GeneXpert MRSA Assay (FDA approved for NASAL specimens only), is one component of a comprehensive MRSA colonization surveillance program. It is not intended to diagnose MRSA infection nor to guide or monitor treatment for MRSA infections.   . Glucose-Capillary 12/02/2016 86  65 - 99 mg/dL Final  . WBC 12/03/2016 2.8* 4.0 - 10.5 K/uL Final  . RBC 12/03/2016 4.14* 4.22 - 5.81 MIL/uL Final  . Hemoglobin 12/03/2016 12.1* 13.0 - 17.0 g/dL Final  . HCT 12/03/2016 35.6* 39.0 - 52.0 % Final  . MCV 12/03/2016 86.0  78.0 - 100.0 fL Final  . MCH 12/03/2016 29.2  26.0 - 34.0 pg Final  . MCHC 12/03/2016 34.0  30.0 - 36.0 g/dL Final  . RDW 12/03/2016 13.4  11.5 - 15.5 % Final  . Platelets 12/03/2016 143* 150 - 400 K/uL Final  . Sodium 12/03/2016 139  135 - 145 mmol/L Final  . Potassium 12/03/2016 3.5  3.5 - 5.1 mmol/L Final  . Chloride 12/03/2016 105  101 - 111 mmol/L Final  . CO2 12/03/2016 27  22 - 32 mmol/L Final  . Glucose, Bld 12/03/2016 85  65 - 99 mg/dL Final   . BUN 12/03/2016 7  6 - 20 mg/dL Final  . Creatinine, Ser 12/03/2016 0.78  0.61 - 1.24 mg/dL Final  . Calcium 12/03/2016 9.2  8.9 - 10.3 mg/dL Final  . GFR calc non Af Amer 12/03/2016 >60  >60 mL/min Final  . GFR calc Af Amer 12/03/2016 >60  >60 mL/min Final   Comment: (NOTE) The eGFR has been calculated using the CKD EPI equation. This calculation has not been validated in all clinical situations. eGFR's persistently <60 mL/min signify possible Chronic Kidney Disease.   . Anion gap 12/03/2016 7  5 - 15 Final  . Glucose-Capillary 12/03/2016 83  65 - 99 mg/dL Final  . Comment 1 12/03/2016 Notify RN   Final  . Comment 2 12/03/2016 Document in Chart   Final  Abstract on 11/10/2016  Component Date  Value Ref Range Status  . Glucose 11/05/2016 85   Final  . BUN 11/05/2016 9  4 - 21 Final  . Creatinine 11/05/2016 0.7  0.6 - 1.3 Final  . Potassium 11/05/2016 4.2  3.4 - 5.3 Final  . Sodium 11/05/2016 138  137 - 147 Final  Abstract on 10/29/2016  Component Date Value Ref Range Status  . Hemoglobin 10/26/2016 13.5  13.5 - 17.5 Final  . HCT 10/26/2016 42  41 - 53 Final  . Neutrophils Absolute 10/26/2016 3   Final  . Platelets 10/26/2016 166  150 - 399 Final  . WBC 10/26/2016 4.6   Final  . Glucose 10/26/2016 100   Final  . BUN 10/26/2016 17  4 - 21 Final  . Creatinine 10/26/2016 0.7  0.6 - 1.3 Final  . Potassium 10/26/2016 4.4  3.4 - 5.3 Final  . Sodium 10/26/2016 141  137 - 147 Final  . Alkaline Phosphatase 10/26/2016 51  25 - 125 Final  . ALT 10/26/2016 17  10 - 40 Final  . AST 10/26/2016 10* 14 - 40 Final  . Bilirubin, Total 10/26/2016 0.4   Final  . Hemoglobin A1C 10/26/2016 4.5   Final    Ct Head Wo Contrast  Result Date: 12/01/2016 CLINICAL DATA:  51 year old presenting with acute mental status changes, becoming unresponsive at the nursing home and unable to answer questions thereafter personal history of stroke. EXAM: CT HEAD WITHOUT CONTRAST TECHNIQUE: Contiguous axial images  were obtained from the base of the skull through the vertex without intravenous contrast. COMPARISON:  None. FINDINGS: Motion blurred several of the images during the examination but the study appears diagnostic. Brain: Head tilt in the gantry accounts for apparent asymmetry. Encephalomalacia involving the left occipital lobe and the left posterior temporal lobe. Low attenuation involving pons. Ventricular system normal in size and appearance for age. Severe changes of small vessel disease of the white matter diffusely No mass lesion. No midline shift. No acute hemorrhage or hematoma. No extra-axial fluid collections. No evidence of acute infarction. Vascular: Mild bilateral carotid siphon and left vertebral artery atherosclerosis. Atretic right vertebral artery. No hyperdense vessel. Skull: No skull fracture or other focal osseous abnormality involving the skull. Sinuses/Orbits: Visualized paranasal sinuses, bilateral mastoid air cells and bilateral middle ear cavities well-aerated. Visualized orbits and globes are normal. Other: None. IMPRESSION: 1. No acute intracranial abnormality. 2. Remote cortical stroke involving the left occipital lobe and left superior temporal lobe (left posterior cerebral artery distribution). 3. Remote stroke involving the pons. 4. Severe chronic microvascular ischemic changes involving the white matter. Electronically Signed   By: Evangeline Dakin M.D.   On: 12/01/2016 14:35   Ct Angio Chest Pe W And/or Wo Contrast  Result Date: 12/01/2016 CLINICAL DATA:  High probability of pulmonary embolism EXAM: CT ANGIOGRAPHY CHEST WITH CONTRAST TECHNIQUE: Multidetector CT imaging of the chest was performed using the standard protocol during bolus administration of intravenous contrast. Multiplanar CT image reconstructions and MIPs were obtained to evaluate the vascular anatomy. CONTRAST:  100 cc Isovue 370 COMPARISON:  Chest x-ray of 12/01/2016 FINDINGS: Cardiovascular: The heart is mildly  enlarged. No pericardial effusion is seen. No significant coronary artery calcifications are evident. The mid ascending thoracic aorta measures 41 mm in diameter. Recommend annual imaging followup by CTA or MRA. This recommendation follows 2010 ACCF/AHA/AATS/ACR/ASA/SCA/SCAI/SIR/STS/SVM Guidelines for the Diagnosis and Management of Patients with Thoracic Aortic Disease. Circulation. 2010; 121: W979-Y801. The pulmonary artery opacified well. No evidence of acute pulmonary embolism is  seen. Linear artifacts do obscure some detail within the distal branches to the lower lobes. Mediastinum/Nodes: There may be a small hiatal hernia present. No mediastinal or hilar adenopathy is seen. The thyroid gland is unremarkable. Lungs/Pleura: On lung window images, no pneumonia is seen and there is no evidence of pleural effusion. No suspicious lung nodule is noted. The central airway is patent. Upper Abdomen: Images through the upper abdomen show no significant abnormality. The stomach is relatively distended with fluid. Musculoskeletal: There are mild degenerative changes throughout the thoracic spine diffusely. No compression deformity is seen. Review of the MIP images confirms the above findings. IMPRESSION: 1. No evidence of acute pulmonary embolism is seen. 2. Fusiform dilatation of the ascending thoracic aorta measuring up to 41 mm in diameter. Recommend annual imaging followup by CTA or MRA. This recommendation follows 2010 ACCF/AHA/AATS/ACR/ASA/SCA/SCAI/SIR/STS/SVM Guidelines for the Diagnosis and Management of Patients with Thoracic Aortic Disease. Circulation. 2010; 121: P102-H852 3. Possible small hiatal hernia. Electronically Signed   By: Ivar Drape M.D.   On: 12/01/2016 16:23   Dg Chest Portable 1 View  Result Date: 12/01/2016 CLINICAL DATA:  Status post CVA last month with persistent right-sided weakness. Patient was found unresponsive in the wheelchair this morning without apparent cause. The patient underwent  CPR for 3 minutes. EXAM: PORTABLE CHEST 1 VIEW COMPARISON:  None in PACs FINDINGS: The lungs are adequately inflated and clear. The heart is mildly enlarged. The pulmonary vascularity is normal. The mediastinum is normal in width. The bony thorax is unremarkable. External pacemaker defibrillator pads are present. IMPRESSION: Mild cardiomegaly without pulmonary edema. No acute cardiopulmonary abnormality. Electronically Signed   By: David  Martinique M.D.   On: 12/01/2016 12:41     Assessment/Plan   ICD-10-CM   1. History of sudden cardiac arrest successfully resuscitated Z86.74   2. Hemiparesis affecting right side as late effect of stroke ([place]) I69.351   3. History of CVA (cerebrovascular accident) Z86.73   4. Contracture of joint of left hand M24.542   5. Essential hypertension I10   6. Dysphagia, unspecified type R13.10   7. S/P percutaneous endoscopic gastrostomy (PEG) tube placement ([place]) Z93.1   8. Mixed hyperlipidemia E78.2   9. Dysarthria R47.1   10. Depression, recurrent ([place]) F33.9     Check lipid panel  F/u with cardio as scheduled  Cont current medications as ordered  PT/OT/ST as ordered  Peg tube care as indicated  Cont supplementing TF if consume <50% of meals   GOAL: short term rehab then continue long term care. Communicated with pt and nursing.  Will follow  Janeva Peaster S. Perlie Gold  [facility] [address] [place], [place] [address] [phone] Cell (Monday-Friday 8 AM - 5 PM) [phone] After 5 PM and follow prompts

## 2016-12-08 LAB — LIPID PANEL
CHOLESTEROL: 109 (ref 0–200)
HDL: 81 — AB (ref 35–70)
LDL Cholesterol: 21
TRIGLYCERIDES: 34 — AB (ref 40–160)

## 2016-12-14 ENCOUNTER — Non-Acute Institutional Stay (SKILLED_NURSING_FACILITY): Payer: Medicaid Other | Admitting: Adult Health

## 2016-12-14 ENCOUNTER — Encounter: Payer: Self-pay | Admitting: Adult Health

## 2016-12-14 DIAGNOSIS — H1033 Unspecified acute conjunctivitis, bilateral: Secondary | ICD-10-CM | POA: Diagnosis not present

## 2016-12-14 NOTE — Progress Notes (Signed)
Location:   [facility] Room Number: 229 B Place of Service:  SNF (31)   CODE STATUS: Full Code  No Known Allergies  Chief Complaint  Patient presents with  . Bleeding/Bruising    Yellow eye drainage    HPI:  He is being seen for yellow drainage from both eyes. He did state his eyes hurt. Nursing states that that his eyes have been irritated for the past several days. Staff has noted that he is rubbing his eyes frequently; there is drainage from both eyes and both eyes are red.    Past Medical History:  Diagnosis Date  . Arthritis   . Contracture, left hand   . 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 Depression   . Hyperlipidemia   . Hypertension   . Stroke [facility])     Past Surgical History:  Procedure Laterality Date  . HERNIA REPAIR    . PEG TUBE PLACEMENT  07/01/2016    Social History   Social History  . Marital status: Widowed    Spouse name: N/A  . Number of children: N/A  . Years of education: N/A   Occupational History  . Not on file.   Social History Main Topics  . Smoking status: Never Smoker  . Smokeless tobacco: Never Used  . Alcohol use Not on file  . Drug use: No  . Sexual activity: Not on file   Other Topics Concern  . Not on file   Social History Narrative  . No narrative on file   Family History  Problem Relation Age of Onset  . Hypertension Other       VITAL SIGNS Ht 6' (1.829 m)   Wt 184 lb 10 oz (83.7 kg)   BMI 25.04 kg/m   Patient's Medications  New Prescriptions   No medications on file  Previous Medications   AMLODIPINE (NORVASC) 10 MG TABLET    Take 1 tablet (10 mg total) by mouth daily.   ASPIRIN 325 MG TABLET    Place 325 mg into feeding tube daily.   ATORVASTATIN (LIPITOR) 20 MG TABLET    Place 20 mg into feeding tube at bedtime.    FLUOXETINE (PROZAC) 20 MG CAPSULE    Place 20 mg into feeding tube daily.   HYDRALAZINE (APRESOLINE) 25 MG TABLET    Take 1 tablet (25 mg total) by mouth 2 (two) times daily.     NUTRITIONAL SUPPLEMENTS (FEEDING SUPPLEMENT, JEVITY 1.5 CAL/FIBER,) LIQD    Place 237 mLs into feeding tube 3 (three) times daily with meals as needed (Administer 1 can if po intake of meal is <50%).   RISPERIDONE (RISPERDAL) 1 MG TABLET    Place 1 mg into feeding tube at bedtime.   TIZANIDINE (ZANAFLEX) 2 MG CAPSULE    Place 2 mg into feeding tube 2 (two) times daily.  Modified Medications   No medications on file  Discontinued Medications   No medications on file     SIGNIFICANT DIAGNOSTIC EXAMS  PREVIOUS:   12-01-16: chest x-ray: Mild cardiomegaly without pulmonary edema. No acute cardiopulmonary abnormality.  12-01-16: ct of head:  1. No acute intracranial abnormality. 2. Remote cortical stroke involving the left occipital lobe and left superior temporal lobe (left posterior cerebral artery distribution). 3. Remote stroke involving the pons. 4. Severe chronic microvascular ischemic changes involving the white Matter.  12-01-16: ct angio of chest:  1. No evidence of acute pulmonary embolism is seen. 2. Fusiform dilatation of the ascending  thoracic aorta measuring up to 41 mm in diameter. Recommend annual imaging followup by CTA or MRA.  3. Possible small hiatal hernia.  12-02-16: EEG:  EEG Abnormalities: None Clinical Interpretation: This normal EEG is recorded in the waking state. There was no seizure or seizure predisposition recorded on this study. Please note that a normal EEG does not preclude the possibility of epilepsy.    12-02-16: 2-d echo:  - Left ventricle: The cavity size was mildly dilated. Wall motion  was normal; there were no regional wall motion abnormalities. Mildly reduced global EF 50%   Doppler parameters are consistent with abnormal left ventricular relaxation (grade 1 diastolic dysfunction). - No significant valvular abnormality - Mild dilatation of aortic root (4.1 cm) and proximal ascending aorta (3.9 cm)   NO NEW EXAMS    LABS REVIEWED;  PREVIOUS  10-26-16: wbc 4.6; hgb 13.5; hct 41.7; mcv 91.8; plt 166 glucose 100; bun 17.2; creat 0.72; k+ 4.4 ;na++ 141; liver normal albumin 3.7 hgb a1c 4.5  11-05-16: glucose 85; bun 9.3; creat 0.66; k+ 4.2; na++ 138; ca 9.7 12-01-16: wbc 2.8; hgb 11.1; hct 33.6; mcv 88.2. plt 131; glucose 99; bun 17; creat 1.04; k+ 3.7; na++ 138; ca 8.9; liver normal albumin 3.1 12-03-16: wbc 2.8; hgb 12.1; hct 35.6; mcv 86.0; plt 143glucose 85; bun 7; creat 0.78; k+ 3.5; na++ 139; ca 9.2  NO NEW LABS      Review of Systems  Unable to perform ROS: Other (expressive aphasia )   Physical Exam  Constitutional: He appears well-developed and well-nourished. No distress.  Eyes: Right eye exhibits discharge. Left eye exhibits discharge.  Yellow drainage present Both sclera are red and inflamed   Neck: Neck supple.  Cardiovascular: Normal rate, regular rhythm and intact distal pulses.   Murmur heard. 1/6  Pulmonary/Chest: Effort normal and breath sounds normal. No respiratory distress.  Abdominal: Soft. Bowel sounds are normal. He exhibits no distension. There is no tenderness.  Peg tube present without signs of infection present   Musculoskeletal: He exhibits no edema.  Right hemiparesis   Lymphadenopathy:    He has no cervical adenopathy.  Neurological: He is alert.  Skin: Skin is warm and dry. He is not diaphoretic.  Psychiatric: He has a normal mood and affect.    ASSESSMENT/ PLAN:  TODAY:  1.  Bilateral conjunctivitis: is worse; will begin tobramycin ophthalmic 0.3% solution one drop to both eyes four times daily for one week. Will monitor   MD is aware of resident's narcotic use and is in agreement with current plan of care. We will attempt to wean resident as apropriate     Synthia Innocent NP [facility]  Contact [phone] Monday through Friday 8am- 5pm  After hours call [phone]

## 2016-12-23 ENCOUNTER — Encounter (HOSPITAL_COMMUNITY): Payer: Self-pay | Admitting: *Deleted

## 2016-12-23 ENCOUNTER — Emergency Department (HOSPITAL_COMMUNITY): Payer: Medicaid Other

## 2016-12-23 ENCOUNTER — Inpatient Hospital Stay (HOSPITAL_COMMUNITY)
Admission: EM | Admit: 2016-12-23 | Discharge: 2016-12-26 | DRG: 057 | Disposition: A | Discharge status: Skilled Nursing Facility | Payer: Medicaid Other | Attending: Internal Medicine | Admitting: Internal Medicine

## 2016-12-23 DIAGNOSIS — R778 Other specified abnormalities of plasma proteins: Secondary | ICD-10-CM

## 2016-12-23 DIAGNOSIS — Z931 Gastrostomy status: Secondary | ICD-10-CM | POA: Diagnosis not present

## 2016-12-23 DIAGNOSIS — I69398 Other sequelae of cerebral infarction: Principal | ICD-10-CM

## 2016-12-23 DIAGNOSIS — I10 Essential (primary) hypertension: Secondary | ICD-10-CM | POA: Diagnosis present

## 2016-12-23 DIAGNOSIS — R1310 Dysphagia, unspecified: Secondary | ICD-10-CM | POA: Diagnosis not present

## 2016-12-23 DIAGNOSIS — I69351 Hemiplegia and hemiparesis following cerebral infarction affecting right dominant side: Secondary | ICD-10-CM | POA: Diagnosis not present

## 2016-12-23 DIAGNOSIS — Z8249 Family history of ischemic heart disease and other diseases of the circulatory system: Secondary | ICD-10-CM

## 2016-12-23 DIAGNOSIS — R569 Unspecified convulsions: Secondary | ICD-10-CM | POA: Diagnosis not present

## 2016-12-23 DIAGNOSIS — F0390 Unspecified dementia without behavioral disturbance: Secondary | ICD-10-CM | POA: Diagnosis present

## 2016-12-23 DIAGNOSIS — R7989 Other specified abnormal findings of blood chemistry: Secondary | ICD-10-CM

## 2016-12-23 DIAGNOSIS — I69391 Dysphagia following cerebral infarction: Secondary | ICD-10-CM

## 2016-12-23 DIAGNOSIS — E785 Hyperlipidemia, unspecified: Secondary | ICD-10-CM | POA: Diagnosis present

## 2016-12-23 DIAGNOSIS — Z7982 Long term (current) use of aspirin: Secondary | ICD-10-CM

## 2016-12-23 DIAGNOSIS — R748 Abnormal levels of other serum enzymes: Secondary | ICD-10-CM | POA: Diagnosis present

## 2016-12-23 DIAGNOSIS — G4089 Other seizures: Secondary | ICD-10-CM | POA: Diagnosis present

## 2016-12-23 LAB — URINALYSIS, ROUTINE W REFLEX MICROSCOPIC
BILIRUBIN URINE: NEGATIVE
Glucose, UA: NEGATIVE mg/dL
HGB URINE DIPSTICK: NEGATIVE
Ketones, ur: NEGATIVE mg/dL
LEUKOCYTES UA: NEGATIVE
Nitrite: NEGATIVE
PROTEIN: 30 mg/dL — AB
SPECIFIC GRAVITY, URINE: 1.013 (ref 1.005–1.030)
SQUAMOUS EPITHELIAL / LPF: NONE SEEN
pH: 5 (ref 5.0–8.0)

## 2016-12-23 LAB — COMPREHENSIVE METABOLIC PANEL
ALBUMIN: 3.8 g/dL (ref 3.5–5.0)
ALT: 12 U/L — AB (ref 17–63)
AST: 13 U/L — AB (ref 15–41)
Alkaline Phosphatase: 47 U/L (ref 38–126)
Anion gap: 9 (ref 5–15)
BUN: 10 mg/dL (ref 6–20)
CHLORIDE: 104 mmol/L (ref 101–111)
CO2: 24 mmol/L (ref 22–32)
CREATININE: 0.75 mg/dL (ref 0.61–1.24)
Calcium: 9.3 mg/dL (ref 8.9–10.3)
GFR calc Af Amer: >60 mL/min (ref >60)
GLUCOSE: 105 mg/dL — AB (ref 65–99)
POTASSIUM: 3.3 mmol/L — AB (ref 3.5–5.1)
Sodium: 137 mmol/L (ref 135–145)
Total Bilirubin: 0.7 mg/dL (ref 0.3–1.2)
Total Protein: 7.1 g/dL (ref 6.5–8.1)

## 2016-12-23 LAB — CBC
HEMATOCRIT: 38.3 % — AB (ref 39.0–52.0)
Hemoglobin: 12.8 g/dL — ABNORMAL LOW (ref 13.0–17.0)
MCH: 29.2 pg (ref 26.0–34.0)
MCHC: 33.4 g/dL (ref 30.0–36.0)
MCV: 87.2 fL (ref 78.0–100.0)
PLATELETS: 185 10*3/uL (ref 150–400)
RBC: 4.39 MIL/uL (ref 4.22–5.81)
RDW: 14.1 % (ref 11.5–15.5)
WBC: 5.4 10*3/uL (ref 4.0–10.5)

## 2016-12-23 LAB — I-STAT TROPONIN, ED: TROPONIN I, POC: 0.14 ng/mL — AB (ref 0.00–0.08)

## 2016-12-23 LAB — TROPONIN I: Troponin I: <0.03 ng/mL (ref <0.03)

## 2016-12-23 MED ORDER — ONDANSETRON HCL 4 MG PO TABS: 4.0000 mg | ORAL_TABLET | Freq: Four times a day (QID) | ORAL | Status: DC | PRN

## 2016-12-23 MED ORDER — ASPIRIN 325 MG PO TABS
325.0000 mg | ORAL_TABLET | Freq: Every day | ORAL | Status: DC
  Administered 2016-12-24 – 2016-12-26 (×3): 325 mg
  Filled 2016-12-23 (×3): qty 1

## 2016-12-23 MED ORDER — AMLODIPINE BESYLATE 5 MG PO TABS
10.0000 mg | ORAL_TABLET | Freq: Every day | ORAL | Status: DC
  Administered 2016-12-24 – 2016-12-26 (×3): 10 mg
  Filled 2016-12-23 (×3): qty 2

## 2016-12-23 MED ORDER — SODIUM CHLORIDE 0.9 % IV SOLN
500.0000 mg | Freq: Once | INTRAVENOUS | Status: AC
  Administered 2016-12-23: 500 mg via INTRAVENOUS
  Filled 2016-12-23: qty 5

## 2016-12-23 MED ORDER — LEVETIRACETAM 100 MG/ML PO SOLN
500.0000 mg | Freq: Two times a day (BID) | ORAL | Status: DC
  Administered 2016-12-24 – 2016-12-26 (×5): 500 mg
  Filled 2016-12-23 (×6): qty 5

## 2016-12-23 MED ORDER — ENOXAPARIN SODIUM 40 MG/0.4ML ~~LOC~~ SOLN
40.0000 mg | SUBCUTANEOUS | Status: DC
  Administered 2016-12-23 – 2016-12-25 (×3): 40 mg via SUBCUTANEOUS
  Filled 2016-12-23 (×4): qty 0.4

## 2016-12-23 MED ORDER — TOBRAMYCIN 0.3 % OP SOLN: 1.0000 [drp] | Freq: Four times a day (QID) | OPHTHALMIC | Status: DC

## 2016-12-23 MED ORDER — ATORVASTATIN CALCIUM 10 MG PO TABS
20.0000 mg | ORAL_TABLET | Freq: Every day | ORAL | Status: DC
  Administered 2016-12-24 – 2016-12-25 (×3): 20 mg
  Filled 2016-12-23 (×3): qty 2
  Filled 2016-12-23: qty 1

## 2016-12-23 MED ORDER — TIZANIDINE HCL 2 MG PO TABS
2.0000 mg | ORAL_TABLET | Freq: Two times a day (BID) | ORAL | Status: DC
  Administered 2016-12-24 – 2016-12-26 (×6): 2 mg via ORAL
  Filled 2016-12-23 (×7): qty 1

## 2016-12-23 MED ORDER — ONDANSETRON HCL 4 MG/2ML IJ SOLN: 4.0000 mg | Freq: Four times a day (QID) | INTRAMUSCULAR | Status: DC | PRN

## 2016-12-23 MED ORDER — ACETAMINOPHEN 325 MG PO TABS: 650.0000 mg | ORAL_TABLET | Freq: Four times a day (QID) | ORAL | Status: DC | PRN

## 2016-12-23 MED ORDER — JEVITY 1.5 CAL/FIBER PO LIQD
237.0000 mL | Freq: Three times a day (TID) | ORAL | Status: DC | PRN
  Filled 2016-12-23 (×3): qty 1000

## 2016-12-23 MED ORDER — LEVETIRACETAM 100 MG/ML PO SOLN: 500.0000 mg | Freq: Two times a day (BID) | ORAL | Status: DC

## 2016-12-23 MED ORDER — HYDRALAZINE HCL 25 MG PO TABS
25.0000 mg | ORAL_TABLET | Freq: Two times a day (BID) | ORAL | Status: DC
  Administered 2016-12-24 – 2016-12-26 (×6): 25 mg
  Filled 2016-12-23 (×8): qty 1

## 2016-12-23 MED ORDER — RISPERIDONE 1 MG PO TABS
1.0000 mg | ORAL_TABLET | Freq: Every day | ORAL | Status: DC
  Administered 2016-12-24 – 2016-12-25 (×3): 1 mg
  Filled 2016-12-23: qty 2
  Filled 2016-12-23: qty 1
  Filled 2016-12-23 (×2): qty 2
  Filled 2016-12-23 (×3): qty 1

## 2016-12-23 MED ORDER — ASPIRIN 300 MG RE SUPP: 300.0000 mg | Freq: Once | RECTAL | Status: DC

## 2016-12-23 MED ORDER — FLUOXETINE HCL 20 MG PO CAPS
20.0000 mg | ORAL_CAPSULE | Freq: Every day | ORAL | Status: DC
  Administered 2016-12-24 – 2016-12-26 (×3): 20 mg
  Filled 2016-12-23 (×4): qty 1

## 2016-12-23 MED ORDER — ACETAMINOPHEN 650 MG RE SUPP: 650.0000 mg | Freq: Four times a day (QID) | RECTAL | Status: DC | PRN

## 2016-12-23 NOTE — ED Notes (Signed)
Patient transported to CT 

## 2016-12-23 NOTE — ED Notes (Signed)
Patient guardian : Ardelle BallsWillie Gillon requesting to be notified upon treatment decision. [phone]

## 2016-12-23 NOTE — ED Triage Notes (Signed)
Pt in from [facility] via [facility], per report pt had witnessed 5 min  Seizure with body shakes today at unknown time while at lunch table, pt in room when EMS arrived, no tongue injury, unknown incontinence,  No Hx of seizures, hx of stroke with R sided deficits and L hand contracture, pts neuro baseline is one to two word phrases per EMS report, pt has peg tube in place per EMS pt can swallow now

## 2016-12-23 NOTE — ED Provider Notes (Signed)
6:49 PM Care assumed from Dr. Corlis LeakMackuen.   At time of transfer of care, patient is awaiting screening laboratory testing prior to admission for seizure versus syncope and positive troponin.  On my assessment, patient is unable to provide any information.   Patient's diagnostic laboratory testing returned. No evidence of urinary tract infection. Metabolic panel showed mild hypokalemia of 3.3 but otherwise kidney function is normal. CBC showed mild anemia and no leukocytosis.  Hospitalist team will be called for admission.   Clinical Impression: 1. Seizure-like activity (HCC)   2. Elevated troponin     Disposition: Admit to Hospitalist service     Rashawnda Gaba, Canary Brimhristopher J, MD 12/24/16 1115

## 2016-12-23 NOTE — H&P (Signed)
History and Physical    Jeremy Brennan ZOX:[id number] DOB: Apr 13, 1965 DOA: 12/23/2016  PCP: Kirt Boys, DO  Patient coming from: SNF  I have personally briefly reviewed patient's old medical records in [facility]  Chief Complaint: Seizure like activity  HPI: Jeremy Brennan is a 51 y.o. male with medical history significant of major stroke.  Had been in SNF for rehab when he had a syncopal episode on 9/25 last month.  He had been left alone for 10 mins, and when he was found unresponsive, CPR was started at that time, patient woke up to chest compressions after 3 mins of CPR.  Unclear overall if he did or didn't have actual cardiac arrest vs an unwitnessed seizure (please see my partner's H+P and Discharge summaries from last admission from 9/25-9/27 for a full discussion here).  Regardless, today he had a reported witnessed event.  NH reports "shaking activity" during lunch.  No urination, no tongue laceration.  Patient back to baseline mental status in the ED.   ED Course: CT head unchanged, Keppra 500mg  given, trop 0.14.   Review of Systems: Unable to perform due to baseline AMS from stroke.   Past Medical History:  Diagnosis Date  . Arthritis   . Contracture, left hand   . 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 Depression   . Hyperlipidemia   . Hypertension   . Stroke [facility])     Past Surgical History:  Procedure Laterality Date  . HERNIA REPAIR    . PEG TUBE PLACEMENT  07/01/2016     reports that he has never smoked. He has never used smokeless tobacco. He reports that he does not use drugs. His alcohol history is not on file.  No Known Allergies  Family History  Problem Relation Age of Onset  . Hypertension Other      Prior to Admission medications   Medication Sig Start Date End Date Taking? Authorizing Provider  amLODipine (NORVASC) 10 MG tablet Take 1 tablet (10 mg total) by mouth daily. Patient taking differently: Place 10 mg into feeding tube  daily.  12/03/16 01/02/17 Yes Arrien, York Ram, MD  aspirin 325 MG tablet Place 325 mg into feeding tube daily.   Yes [provider]  atorvastatin (LIPITOR) 20 MG tablet Place 20 mg into feeding tube at bedtime.    Yes [provider]  FLUoxetine (PROZAC) 20 MG capsule Place 20 mg into feeding tube daily.   Yes [provider]  hydrALAZINE (APRESOLINE) 25 MG tablet Take 1 tablet (25 mg total) by mouth 2 (two) times daily. Patient taking differently: Place 25 mg into feeding tube 2 (two) times daily.  12/03/16 12/03/17 Yes Arrien, York Ram, MD  Nutritional Supplements (FEEDING SUPPLEMENT, JEVITY 1.5 CAL/FIBER,) LIQD Place 237 mLs into feeding tube 3 (three) times daily with meals as needed (Administer 1 can if po intake of meal is <50%). 12/03/16  Yes Arrien, York Ram, MD  risperiDONE (RISPERDAL) 1 MG tablet Place 1 mg into feeding tube at bedtime.   Yes [provider]  tizanidine (ZANAFLEX) 2 MG capsule Place 2 mg into feeding tube 2 (two) times daily.   Yes [provider]  tobramycin (TOBREX) 0.3 % ophthalmic solution Place 1 drop into both eyes 4 (four) times daily. For  6 days; started on 12-17-16   Yes [provider]    Physical Exam: Vitals:   12/23/16 1745 12/23/16 1815 12/23/16 1830 12/23/16 1845  BP: 121/85 127/84  138/89 122/82  Pulse: 80 100 (!) 104 98  Resp: 12 13 13 16   Temp:      TempSrc:      SpO2: 97% 98% 94% 98%    Constitutional: NAD, calm, comfortable Eyes: PERRL, lids and conjunctivae normal ENMT: Mucous membranes are moist. Posterior pharynx clear of any exudate or lesions.Normal dentition.  Neck: normal, supple, no masses, no thyromegaly Respiratory: clear to auscultation bilaterally, no wheezing, no crackles. Normal respiratory effort. No accessory muscle use.  Cardiovascular: Regular rate and rhythm, no murmurs / rubs / gallops. No extremity edema. 2+ pedal pulses. No carotid bruits.    Abdomen: no tenderness, no masses palpated. No hepatosplenomegaly. Bowel sounds positive.  Musculoskeletal: no clubbing / cyanosis. No joint deformity upper and lower extremities. Good ROM, no contractures. Normal muscle tone.  Skin: no rashes, lesions, ulcers. No induration Neurologic: BUE contractures, BLE weakness R>L Psychiatric: Able to say a few words, slow speech, suspect this is baseline based on numerous prior notes from the past month.   Labs on Admission: I have personally reviewed following labs and imaging studies  CBC:  Recent Labs Lab 12/23/16 1603  WBC 5.4  HGB 12.8*  HCT 38.3*  MCV 87.2  PLT 185   Basic Metabolic Panel:  Recent Labs Lab 12/23/16 1614  NA 137  K 3.3*  CL 104  CO2 24  GLUCOSE 105*  BUN 10  CREATININE 0.75  CALCIUM 9.3   GFR: Estimated Creatinine Clearance: 117.2 mL/min (by C-G formula based on SCr of 0.75 mg/dL). Liver Function Tests:  Recent Labs Lab 12/23/16 1614  AST 13*  ALT 12*  ALKPHOS 47  BILITOT 0.7  PROT 7.1  ALBUMIN 3.8   No results for input(s): LIPASE, AMYLASE in the last 168 hours. No results for input(s): AMMONIA in the last 168 hours. Coagulation Profile: No results for input(s): INR, PROTIME in the last 168 hours. Cardiac Enzymes: No results for input(s): CKTOTAL, CKMB, CKMBINDEX, TROPONINI in the last 168 hours. BNP (last 3 results) No results for input(s): PROBNP in the last 8760 hours. HbA1C: No results for input(s): HGBA1C in the last 72 hours. CBG: No results for input(s): GLUCAP in the last 168 hours. Lipid Profile: No results for input(s): CHOL, HDL, LDLCALC, TRIG, CHOLHDL, LDLDIRECT in the last 72 hours. Thyroid Function Tests: No results for input(s): TSH, T4TOTAL, FREET4, T3FREE, THYROIDAB in the last 72 hours. Anemia Panel: No results for input(s): VITAMINB12, FOLATE, FERRITIN, TIBC, IRON, RETICCTPCT in the last 72 hours. Urine analysis:    Component Value Date/Time   COLORURINE YELLOW  12/23/2016 1624   APPEARANCEUR CLEAR 12/23/2016 1624   LABSPEC 1.013 12/23/2016 1624   PHURINE 5.0 12/23/2016 1624   GLUCOSEU NEGATIVE 12/23/2016 1624   HGBUR NEGATIVE 12/23/2016 1624   BILIRUBINUR NEGATIVE 12/23/2016 1624   KETONESUR NEGATIVE 12/23/2016 1624   PROTEINUR 30 (A) 12/23/2016 1624   NITRITE NEGATIVE 12/23/2016 1624   LEUKOCYTESUR NEGATIVE 12/23/2016 1624    Radiological Exams on Admission: Ct Head Wo Contrast  Result Date: 12/23/2016 CLINICAL DATA:  Seizure EXAM: CT HEAD WITHOUT CONTRAST TECHNIQUE: Contiguous axial images were obtained from the base of the skull through the vertex without intravenous contrast. COMPARISON:  December 01, 2016 FINDINGS: Brain: The ventricles are within normal limits with respect to size and configuration. There is no intracranial mass, hemorrhage, extra-axial fluid collection, or midline shift. There is evidence of a prior infarct in the medial left occipital lobe, stable. This infarct also involves a portion of the  posterior left temporal lobe, stable. There is evidence of prior infarcts in the left lentiform nucleus. There is decreased attenuation throughout the centra semiovale bilaterally, advanced for age. There is decreased attenuation in the mid pons in the basilar perforator distribution region. There is no new gray-white compartment lesion. No evident acute infarct. Vascular: No hyperdense vessel is appreciable. There is calcification in both carotid siphon regions, age advanced. Skull: Bony calvarium appears intact. Sinuses/Orbits: Paranasal sinuses are clear. Orbits appear symmetric bilaterally. Other: Mastoid air cells are clear. There is debris in both external auditory canals. IMPRESSION: 1. Multifocal infarcts. Extensive periventricular decreased attenuation, suspect small vessel vascular disease, age advanced, although a degree of superimposed demyelination cannot be excluded. Small vessel disease also noted in the basilar perforator  distribution of the mid pons. No acute infarct evident. No intracranial mass, hemorrhage, or extra-axial fluid collection. 2.  Foci of arterial vascular calcification, age advanced. 3.  Presumed cerumen in each external auditory canal. Electronically Signed   By: Bretta Bang III M.D.   On: 12/23/2016 15:56   Dg Chest Portable 1 View  Result Date: 12/23/2016 CLINICAL DATA:  Seizure activity.  History of previous CVA. EXAM: PORTABLE CHEST 1 VIEW COMPARISON:  Chest x-ray of December 01, 2016 FINDINGS: The lungs are adequately inflated and clear. The heart and pulmonary vascularity are normal. The mediastinum is normal in width. There is mild tortuosity of the ascending and descending thoracic aorta. The bony thorax is unremarkable. IMPRESSION: There is no pneumonia nor other acute cardiopulmonary abnormality. Electronically Signed   By: David  [place] M.D.   On: 12/23/2016 14:46    EKG: Independently reviewed.  Assessment/Plan Principal Problem:   Seizure (HCC) Active Problems:   Hemiparesis affecting right side as late effect of stroke (HCC)   Essential hypertension   S/P percutaneous endoscopic gastrostomy (PEG) tube placement (HCC)    1. Seizure - ddx includes LOC due to arrhythmia 1. Serial trops 2. Tele monitor 3. Keppra 500 BID 4. Just had 2d echo last admit: EF 50% 5. EEG ordered - though it was normal on 9/26 6. Will hold of on ordering MRI until neurology can evaluate 7. Apparently was altered post event, but now seems to be back to baseline as far as I can tell 2. Hemiparesis of R side - chronic and baseline now, extensive CVA 1. Also noted dysphagia 2 diet recommended on 9/26 last admit.  Although got discharged on just a regular old heart healthy. 2. Jevity if eats less than 50% of food.  DVT prophylaxis: Lovenox Code Status: Full Family Communication: No family in room Disposition Plan: SNF after admit Consults called: Neuro Admission status: Place in  obs   Hillary Bow. DO [facility] Pager [phone]  If 7AM-7PM, please contact day team taking care of patient [email or web address] Password [email or web address]  12/23/2016, 8:01 PM

## 2016-12-23 NOTE — ED Notes (Signed)
Attempted to call report to 3W, receiving nurse is not ready will return call

## 2016-12-23 NOTE — ED Provider Notes (Signed)
[facility] EMERGENCY DEPARTMENT Provider Note   CSN: [id number] Arrival date & time: 12/23/16  1343     History   Chief Complaint Chief Complaint  Patient presents with  . Seizures    HPI Jeremy Brennan is a 51 y.o. male.  HPI   Patient's 51 year old male presenting with episode of altered mental status. Is unclear hat happened. Nursing home reports some shaking activity at lunch. No urination, no tongue laceration.  Patient back to baseline. Patient had recent admission for a similar sounding event less than one month ago. On 9/27 patient was admitted and had a workup including negative EEG, normal echo, CT showing remote infarcts. Patient was watched on the monitor on telemetry and able to be discharged home.    Past Medical History:  Diagnosis Date  . Arthritis   . Contracture, left hand   . 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 Depression   . Hyperlipidemia   . Hypertension   . Stroke [facility])     Patient Active Problem List   Diagnosis Date Noted  . Chronic cerebrovascular accident (CVA) 12/01/2016  . Depression with suicidal ideation 12/01/2016  . Sudden cardiac arrest (HCC) 12/01/2016  . Unresponsive 12/01/2016  . Bradycardia 12/01/2016  . Hemiparesis affecting right side as late effect of stroke (HCC) 11/01/2016  . Contracture of joint of left hand 11/01/2016  . History of CVA (cerebrovascular accident) 11/01/2016  . Essential hypertension 11/01/2016  . Depression, recurrent (HCC) 11/01/2016  . Mixed hyperlipidemia 11/01/2016  . Dysphagia 11/01/2016  . S/P percutaneous endoscopic gastrostomy (PEG) tube placement (HCC) 11/01/2016  . Dysarthria 11/01/2016    Past Surgical History:  Procedure Laterality Date  . HERNIA REPAIR    . PEG TUBE PLACEMENT  07/01/2016       Home Medications    Prior to Admission medications   Medication Sig Start Date End Date Taking? Authorizing Provider  amLODipine (NORVASC) 10 MG tablet Take 1 tablet  (10 mg total) by mouth daily. 12/03/16 01/02/17  Arrien, York Ram, MD  aspirin 325 MG tablet Place 325 mg into feeding tube daily.    [provider]  atorvastatin (LIPITOR) 20 MG tablet Place 20 mg into feeding tube at bedtime.     [provider]  FLUoxetine (PROZAC) 20 MG capsule Place 20 mg into feeding tube daily.    [provider]  hydrALAZINE (APRESOLINE) 25 MG tablet Take 1 tablet (25 mg total) by mouth 2 (two) times daily. 12/03/16 12/03/17  Arrien, York Ram, MD  Nutritional Supplements (FEEDING SUPPLEMENT, JEVITY 1.5 CAL/FIBER,) LIQD Place 237 mLs into feeding tube 3 (three) times daily with meals as needed (Administer 1 can if po intake of meal is <50%). 12/03/16   Arrien, York Ram, MD  risperiDONE (RISPERDAL) 1 MG tablet Place 1 mg into feeding tube at bedtime.    [provider]  tizanidine (ZANAFLEX) 2 MG capsule Place 2 mg into feeding tube 2 (two) times daily.    [provider]    Family History Family History  Problem Relation Age of Onset  . Hypertension Other     Social History Social History  Substance Use Topics  . Smoking status: Never Smoker  . Smokeless tobacco: Never Used  . Alcohol use Not on file     Allergies   Patient has no known allergies.   Review of Systems Review of Systems  Unable to perform ROS: Dementia     Physical Exam Updated  Vital Signs SpO2 97%   Physical Exam  Constitutional: He appears well-nourished.  HENT:  Head: Normocephalic.  Eyes: Conjunctivae are normal. Right eye exhibits no discharge. Left eye exhibits no discharge.  Cardiovascular: Normal rate and regular rhythm.   Pulmonary/Chest: Effort normal and breath sounds normal.  Abdominal: Soft. He exhibits no distension. There is no tenderness.  G-tube in place.  Neurological:  Oriented to person. Contractive and weak right and left upper and lower extremities.  Mask facies  Skin: Skin is warm and dry. He  is not diaphoretic.  Psychiatric: He has a normal mood and affect.     ED Treatments / Results  Labs (all labs ordered are listed, but only abnormal results are displayed) Labs Reviewed  CBC WITH DIFFERENTIAL/PLATELET  COMPREHENSIVE METABOLIC PANEL  URINALYSIS, ROUTINE W REFLEX MICROSCOPIC  I-STAT TROPONIN, ED    EKG  EKG Interpretation  Date/Time:  Wednesday December 23 2016 13:51:37 EDT Ventricular Rate:  100 PR Interval:    QRS Duration: 88 QT Interval:  353 QTC Calculation: 456 R Axis:   42 Text Interpretation:  Junctional tachycardia Low voltage, extremity leads no evidnefce of acute ischemia. Confirmed by Bary Castilla ([id number]) on 12/23/2016 2:04:25 PM       Radiology No results found.  Procedures Procedures (including critical care time)  Medications Ordered in ED Medications - No data to display   Initial Impression / Assessment and Plan / ED Course  I have reviewed the triage vital signs and the nursing notes.  Pertinent labs & imaging results that were available during my care of the patient were reviewed by me and considered in my medical decision making (see chart for details).     Patient's 51 year old male presenting with episode of altered old altered mental status. Is unclear hat happened. Nursing home reports some shaking activity at lunch. No urination, no tongue laceration.  Patient back to baseline. Patient had recent admission for a similar sounding event less than one month ago. On 9/27 patient was admitted and had a workup including negative EEG, normal echo, CT showing remote infarcts. Patient was watched on the monitor on telemetry and able to be discharged home.   2:23 PM  Give the patient recently had admission for exactly the same thing within the last month with no results found, I'm reluctant to admit again. Weighing the risks and benefits of admission (after recent admission not finding anything) with the risk of him having an  infection after admission I believe that is the patient should likely be sent home. I discussed with neurology. Given extensive CVA history (CVA involving the left occipital lobe and left superior temporal lobe, remote stroke involving the pons, with severe chronic microvascular ischemic changes involving white matter) he is many reasons to be having seizures. We'll go ahead and start her on Keppra 500 twice a day and have him follow-up as an outpatient neurology.  Signed out pending labs.   3:19 PM  Patient's  troponin came back positive. It is possible that the event earlier was not seizure activity but rather a dysrhythmia. We will readmit to the hospital for further evaluation given positive troponin in the setting of this event.   CRITICAL CARE Performed by: Arlana Hove Total critical care time: 45 minutes Critical care time was exclusive of separately billable procedures and treating other patients. Critical care was necessary to treat or prevent imminent or life-threatening deterioration. Critical care was time spent personally by me on the following activities: development  of treatment plan with patient and/or surrogate as well as nursing, discussions with consultants, evaluation of patient's response to treatment, examination of patient, obtaining history from patient or surrogate, ordering and performing treatments and interventions, ordering and review of laboratory studies, ordering and review of radiographic studies, pulse oximetry and re-evaluation of patient's condition.   Final Clinical Impressions(s) / ED Diagnoses   Final diagnoses:  None    New Prescriptions New Prescriptions   No medications on file     Abelino DerrickMackuen, Raiden Yearwood Lyn, MD 12/28/16 [phone]

## 2016-12-23 NOTE — ED Notes (Addendum)
Pt is resting and appears comfortable.  Pt is incontinent with urine-linen changed.  Hospitalist Julian ReilGardner was at bedside to admit pt.

## 2016-12-23 NOTE — ED Notes (Signed)
Attempted blood draw x2 unsuccessfully 

## 2016-12-23 NOTE — ED Notes (Signed)
[facility]  She reports the bed is not ready and will call when it is.

## 2016-12-24 ENCOUNTER — Observation Stay (HOSPITAL_COMMUNITY): Payer: Medicaid Other

## 2016-12-24 ENCOUNTER — Encounter (HOSPITAL_COMMUNITY): Payer: Self-pay | Admitting: *Deleted

## 2016-12-24 DIAGNOSIS — E785 Hyperlipidemia, unspecified: Secondary | ICD-10-CM | POA: Diagnosis present

## 2016-12-24 DIAGNOSIS — R748 Abnormal levels of other serum enzymes: Secondary | ICD-10-CM

## 2016-12-24 DIAGNOSIS — R569 Unspecified convulsions: Secondary | ICD-10-CM | POA: Diagnosis not present

## 2016-12-24 DIAGNOSIS — Z8249 Family history of ischemic heart disease and other diseases of the circulatory system: Secondary | ICD-10-CM | POA: Diagnosis not present

## 2016-12-24 DIAGNOSIS — Z931 Gastrostomy status: Secondary | ICD-10-CM | POA: Diagnosis not present

## 2016-12-24 DIAGNOSIS — Z7982 Long term (current) use of aspirin: Secondary | ICD-10-CM | POA: Diagnosis not present

## 2016-12-24 DIAGNOSIS — I69351 Hemiplegia and hemiparesis following cerebral infarction affecting right dominant side: Secondary | ICD-10-CM | POA: Diagnosis not present

## 2016-12-24 DIAGNOSIS — G4089 Other seizures: Secondary | ICD-10-CM | POA: Diagnosis present

## 2016-12-24 DIAGNOSIS — I10 Essential (primary) hypertension: Secondary | ICD-10-CM | POA: Diagnosis present

## 2016-12-24 DIAGNOSIS — F0390 Unspecified dementia without behavioral disturbance: Secondary | ICD-10-CM | POA: Diagnosis present

## 2016-12-24 DIAGNOSIS — I69398 Other sequelae of cerebral infarction: Secondary | ICD-10-CM | POA: Diagnosis not present

## 2016-12-24 DIAGNOSIS — R1310 Dysphagia, unspecified: Secondary | ICD-10-CM | POA: Diagnosis present

## 2016-12-24 LAB — GLUCOSE, CAPILLARY
GLUCOSE-CAPILLARY: 73 mg/dL (ref 65–99)
GLUCOSE-CAPILLARY: 79 mg/dL (ref 65–99)
Glucose-Capillary: 116 mg/dL — ABNORMAL HIGH (ref 65–99)
Glucose-Capillary: 76 mg/dL (ref 65–99)
Glucose-Capillary: 103 mg/dL — ABNORMAL HIGH (ref 65–99)

## 2016-12-24 LAB — TROPONIN I: Troponin I: <0.03 ng/mL (ref <0.03)

## 2016-12-24 MED ORDER — JEVITY 1.5 CAL/FIBER PO LIQD
237.0000 mL | Freq: Three times a day (TID) | ORAL | Status: DC
  Administered 2016-12-24 – 2016-12-26 (×6): 237 mL
  Filled 2016-12-24 (×12): qty 1000

## 2016-12-24 NOTE — Progress Notes (Signed)
RN offered pt dinner at this time, pt verbalized he is not hungry. RN assisted pt with feed but he spits out food. Pt is alert to self at this time. Bolus feed continue.  Sim BoastHavy, RN

## 2016-12-24 NOTE — Procedures (Signed)
ELECTROENCEPHALOGRAM REPORT  Date of Study: 12/24/2016  Patient's Name: Jeremy Brennan MRN: [id number] Date of Birth: 12-Jun-1965  Referring Provider: Hillary BowJared M Gardner, DO  Clinical History: 51 year old male with history of stroke found unresponsive.  Medications: acetaminophen (TYLENOL)  amLODipine (NORVASC)  aspirin  atorvastatin (LIPITOR) enoxaparin (LOVENOX)  FLUoxetine (PROZAC)  hydrALAZINE (APRESOLINE)  levETIRAcetam (KEPPRA) ondansetron (ZOFRAN) risperiDONE (RISPERDAL)  tiZANidine (ZANAFLEX)  Technical Summary: A multichannel digital EEG recording measured by the international 10-20 system with electrodes applied with paste and impedances below 5000 ohms performed as portable with EKG monitoring in an awake patient.  Hyperventilation and photic stimulation were not performed.  The digital EEG was referentially recorded, reformatted, and digitally filtered in a variety of bipolar and referential montages for optimal display.   Description: The patient is awake during the recording.  During maximal wakefulness, there is a symmetric, medium voltage 4-5 Hz posterior dominant rhythm that attenuates with eye opening. This is admixed with diffuse 4-5 Hz theta and 2-3 Hz delta slowing of the waking background. Stage 2 sleep is not seen.  There were no epileptiform discharges or electrographic seizures seen.    EKG lead was unremarkable.  Impression: This awake EEG is abnormal due to diffuse slowing of the waking background.  Clinical Correlation of the above findings indicates diffuse cerebral dysfunction that is non-specific in etiology and can be seen with hypoxic/ischemic injury, toxic/metabolic encephalopathies, neurodegenerative disorders, or medication effect.  The absence of epileptiform discharges does not rule out a clinical diagnosis of epilepsy.  Clinical correlation is advised.  Shon MilletAdam Asiel Chrostowski, DO

## 2016-12-24 NOTE — Progress Notes (Signed)
Patient noted still lethargic from report, he is able to open eyes but fell back to sleep. He is now leaving for EEG.  Sim BoastHavy, RN

## 2016-12-24 NOTE — Progress Notes (Signed)
PROGRESS NOTE    SHIZUO BISKUP  ZOX:[id number] DOB: 10-15-1965 DOA: 12/23/2016 PCP: Kirt Boys, DO   Outpatient Specialists:     Brief Narrative:  BROOKS KINNAN is a 51 y.o. male with medical history significant of major stroke.  Had been in SNF for rehab when he had a syncopal episode on 9/25 last month.  He had been left alone for 10 mins, and when he was found unresponsive, CPR was started at that time, patient woke up to chest compressions after 3 mins of CPR.  Unclear overall if he did or didn't have actual cardiac arrest vs an unwitnessed seizure (please see my partner's H+P and Discharge summaries from last admission from 9/25-9/27 for a full discussion here).  Regardless, today he had a reported witnessed event.  NH reports "shaking activity" during lunch.  No urination, no tongue laceration.  Patient back to baseline mental status in the ED.   Assessment & Plan:   Principal Problem:   Seizure (HCC) Active Problems:   Hemiparesis affecting right side as late effect of stroke (HCC)   Essential hypertension   Dysphagia   S/P percutaneous endoscopic gastrostomy (PEG) tube placement (HCC)  Seizure  Keppra 500 BID       EEG ordered and pending  Per neuro- outpatient follow up  H/o CVA  -lives in SNF  -has PEG   AMS  -not sure of patient's baseline-- per last admission he was more verbal then currently  -check ammonia  -blood sugar as <100  Elevated troponin  -suspect error  -trend was negative  Hemiparesis of R side - chronic and baseline now, extensive CVA Also noted dysphagia 2 diet recommended on 9/26 last admit.   Jevity if eats less than 50% of food From SNF   DVT prophylaxis:  Lovenox   Code Status: Full Code   Family Communication: None at bedside  Disposition Plan:  Back to SNF   Consultants:      Subjective: Says a few words Sweaty appearing- blinds open and sun on patient, BG 81  Objective: Vitals:   12/23/16 2230  12/23/16 2327 12/24/16 0123 12/24/16 0442  BP: 113/89 135/88 118/81 101/67  Pulse: 73 89 85 84  Resp: 10  20 20   Temp:  98.1 F (36.7 C) 98.5 F (36.9 C) 98.2 F (36.8 C)  TempSrc:  Oral Oral Oral  SpO2: 97% 100% 97% 98%  Weight:  86.9 kg (191 lb 9.6 oz)    Height:  6' (1.829 m)      Intake/Output Summary (Last 24 hours) at 12/24/16 1251 Last data filed at 12/24/16 0800  Gross per 24 hour  Intake              100 ml  Output                0 ml  Net              100 ml   Filed Weights   12/23/16 2327  Weight: 86.9 kg (191 lb 9.6 oz)    Examination:  General exam: ill appearing  Respiratory system: no wheezing Cardiovascular system: rrr Gastrointestinal system: +BS, PEG tube in place Central nervous system: will awaken but falls asleep quickly Extremities: not cooperative with exam     Data Reviewed: I have personally reviewed following labs and imaging studies  CBC:  Recent Labs Lab 12/23/16 1603  WBC 5.4  HGB 12.8*  HCT 38.3*  MCV 87.2  PLT 185  Basic Metabolic Panel:  Recent Labs Lab 12/23/16 1614  NA 137  K 3.3*  CL 104  CO2 24  GLUCOSE 105*  BUN 10  CREATININE 0.75  CALCIUM 9.3   GFR: Estimated Creatinine Clearance: 117.2 mL/min (by C-G formula based on SCr of 0.75 mg/dL). Liver Function Tests:  Recent Labs Lab 12/23/16 1614  AST 13*  ALT 12*  ALKPHOS 47  BILITOT 0.7  PROT 7.1  ALBUMIN 3.8   No results for input(s): LIPASE, AMYLASE in the last 168 hours. No results for input(s): AMMONIA in the last 168 hours. Coagulation Profile: No results for input(s): INR, PROTIME in the last 168 hours. Cardiac Enzymes:  Recent Labs Lab 12/23/16 2116 12/24/16 0207 12/24/16 0716  TROPONINI <0.03 <0.03 <0.03   BNP (last 3 results) No results for input(s): PROBNP in the last 8760 hours. HbA1C: No results for input(s): HGBA1C in the last 72 hours. CBG:  Recent Labs Lab 12/24/16 1038  GLUCAP 79   Lipid Profile: No results for  input(s): CHOL, HDL, LDLCALC, TRIG, CHOLHDL, LDLDIRECT in the last 72 hours. Thyroid Function Tests: No results for input(s): TSH, T4TOTAL, FREET4, T3FREE, THYROIDAB in the last 72 hours. Anemia Panel: No results for input(s): VITAMINB12, FOLATE, FERRITIN, TIBC, IRON, RETICCTPCT in the last 72 hours. Urine analysis:    Component Value Date/Time   COLORURINE YELLOW 12/23/2016 1624   APPEARANCEUR CLEAR 12/23/2016 1624   LABSPEC 1.013 12/23/2016 1624   PHURINE 5.0 12/23/2016 1624   GLUCOSEU NEGATIVE 12/23/2016 1624   HGBUR NEGATIVE 12/23/2016 1624   BILIRUBINUR NEGATIVE 12/23/2016 1624   KETONESUR NEGATIVE 12/23/2016 1624   PROTEINUR 30 (A) 12/23/2016 1624   NITRITE NEGATIVE 12/23/2016 1624   LEUKOCYTESUR NEGATIVE 12/23/2016 1624     )No results found for this or any previous visit (from the past 240 hour(s)).    Anti-infectives    None       Radiology Studies: Ct Head Wo Contrast  Result Date: 12/23/2016 CLINICAL DATA:  Seizure EXAM: CT HEAD WITHOUT CONTRAST TECHNIQUE: Contiguous axial images were obtained from the base of the skull through the vertex without intravenous contrast. COMPARISON:  December 01, 2016 FINDINGS: Brain: The ventricles are within normal limits with respect to size and configuration. There is no intracranial mass, hemorrhage, extra-axial fluid collection, or midline shift. There is evidence of a prior infarct in the medial left occipital lobe, stable. This infarct also involves a portion of the posterior left temporal lobe, stable. There is evidence of prior infarcts in the left lentiform nucleus. There is decreased attenuation throughout the centra semiovale bilaterally, advanced for age. There is decreased attenuation in the mid pons in the basilar perforator distribution region. There is no new gray-white compartment lesion. No evident acute infarct. Vascular: No hyperdense vessel is appreciable. There is calcification in both carotid siphon regions, age  advanced. Skull: Bony calvarium appears intact. Sinuses/Orbits: Paranasal sinuses are clear. Orbits appear symmetric bilaterally. Other: Mastoid air cells are clear. There is debris in both external auditory canals. IMPRESSION: 1. Multifocal infarcts. Extensive periventricular decreased attenuation, suspect small vessel vascular disease, age advanced, although a degree of superimposed demyelination cannot be excluded. Small vessel disease also noted in the basilar perforator distribution of the mid pons. No acute infarct evident. No intracranial mass, hemorrhage, or extra-axial fluid collection. 2.  Foci of arterial vascular calcification, age advanced. 3.  Presumed cerumen in each external auditory canal. Electronically Signed   By: Bretta BangWilliam  Woodruff III M.D.   On: 12/23/2016 15:56  Dg Chest Portable 1 View  Result Date: 12/23/2016 CLINICAL DATA:  Seizure activity.  History of previous CVA. EXAM: PORTABLE CHEST 1 VIEW COMPARISON:  Chest x-ray of December 01, 2016 FINDINGS: The lungs are adequately inflated and clear. The heart and pulmonary vascularity are normal. The mediastinum is normal in width. There is mild tortuosity of the ascending and descending thoracic aorta. The bony thorax is unremarkable. IMPRESSION: There is no pneumonia nor other acute cardiopulmonary abnormality. Electronically Signed   By: David  [place] M.D.   On: 12/23/2016 14:46        Scheduled Meds: . amLODipine  10 mg Per Tube Daily  . aspirin  325 mg Per Tube Daily  . atorvastatin  20 mg Per Tube QHS  . enoxaparin (LOVENOX) injection  40 mg Subcutaneous Q24H  . feeding supplement (JEVITY 1.5 CAL/FIBER)  237 mL Per Tube TID PC  . FLUoxetine  20 mg Per Tube Daily  . hydrALAZINE  25 mg Per Tube BID  . levETIRAcetam  500 mg Per Tube BID  . risperiDONE  1 mg Per Tube QHS  . tiZANidine  2 mg Oral BID   Continuous Infusions:   LOS: 0 days    Time spent: 25 min    JESSICA U VANN, DO [facility] Pager  [phone]  If 7PM-7AM, please contact night-coverage [email or web address] Password [email or web address] 12/24/2016, 12:51 PM

## 2016-12-24 NOTE — Care Management Note (Signed)
Case Management Note  Patient Details  Name: Jeremy Brennan MRN: [id number] Date of Birth: 05/14/1965  Subjective/Objective:  Pt in with seizures. [facility].                   Action/Plan: Plan is for [facility] to [facility] medically ready. CSW aware. CM following.  Expected Discharge Date:                  Expected Discharge Plan:  Skilled Nursing Facility  In-House Referral:  Clinical Social Work  Discharge planning Services     Post Acute Care Choice:    Choice offered to:     DME Arranged:    DME Agency:     HH Arranged:    HH Agency:     Status of Service:  In process, will continue to follow  If discussed at Long Length of Stay Meetings, dates discussed:    Additional Comments:  Kermit BaloKelli F Madell Heino, RN 12/24/2016, 1:56 PM

## 2016-12-24 NOTE — Progress Notes (Signed)
EEG completed; results pending.    

## 2016-12-24 NOTE — Progress Notes (Signed)
Patient is now noted more awake and alert. Pt refused tray from lunch but agree to eat dinner. Will continue to bolus feed until pt reach adequate oral intake.   Sim BoastHavy, RN

## 2016-12-24 NOTE — Progress Notes (Signed)
Patient arrived to 3W15 from [facility]. Patient alert and oriented x 1. Right sided weakness from previous stroke and PEG tube. Vitals taken and stable. Telemetry monitor applied. Call bell within reach. Patient oriented to room and call bell. Will continue to monitor.

## 2016-12-24 NOTE — Progress Notes (Signed)
Initial Nutrition Assessment  DOCUMENTATION CODES:   Not applicable  INTERVENTION:   -Continue with Jevity 1.5 237 mL (1 can) to be given via G-tube after meal if po intake <50%. Each can provides 355 kcals, 15.1 g of protein.   -Add Pro-Stat 30 mL BID  Via G-tube (each 30 mL packet provides 15 g of protein and 100 kcals)  -Magic cup BID with meals, each supplement provides 290 kcal and 9 grams of protein  -Feeding assistance at meal times  NUTRITION DIAGNOSIS:   Inadequate oral intake related to chronic illness, dysphagia as evidenced by estimated needs.  GOAL:   Patient will meet greater than or equal to 90% of their needs  MONITOR:   PO intake, Labs, Weight trends, TF tolerance, Skin  REASON FOR ASSESSMENT:   Malnutrition Screening Tool    ASSESSMENT:   51 yo male admitted with seizure. Pt with hx of dementia, depression, HLD, HTN, stroke, arthiritis  Pt did not eat breakfast this AM. Pt indicates that this is because he did not receive breakfast as he has a good appetite. Pt unable to tell writer how much he typically eats in a 24 hour period. At baseline, pt eats Dysphagia II diet with orders for 1 can of Jevity 1.5 to be administered via G-tube after a meal if pt eats <50%  No weight loss per weight encounters.   Nutrition-Focused physical exam completed. Findings are no fat depletion, mild/moderate to severe muscle depletion, and no edema. Pt is essentially bed bound  Labs: reviewed Meds: reviewed  Diet Order:  DIET DYS 2 Room service appropriate? Yes; Fluid consistency: Thin  Skin:  Reviewed, no issues  Last BM:  no documented BM  Height:   Ht Readings from Last 1 Encounters:  12/23/16 6' (1.829 m)    Weight:   Wt Readings from Last 1 Encounters:  12/23/16 191 lb 9.6 oz (86.9 kg)    Ideal Body Weight:     BMI:  Body mass index is 25.99 kg/m.  Estimated Nutritional Needs:   Kcal:  1900-2200 kcals  Protein:  100-115 g  Fluid:  >/= 2  L  EDUCATION NEEDS:   No education needs identified at this time  Romelle StarcherCate Deaun Rocha MS, RD, LDN [phone] Pager  [phone] Weekend/On-Call Pager

## 2016-12-25 ENCOUNTER — Inpatient Hospital Stay (HOSPITAL_COMMUNITY): Payer: Medicaid Other

## 2016-12-25 LAB — GLUCOSE, CAPILLARY
GLUCOSE-CAPILLARY: 112 mg/dL — AB (ref 65–99)
GLUCOSE-CAPILLARY: 77 mg/dL (ref 65–99)
GLUCOSE-CAPILLARY: 126 mg/dL — AB (ref 65–99)
GLUCOSE-CAPILLARY: 93 mg/dL (ref 65–99)
Glucose-Capillary: 70 mg/dL (ref 65–99)

## 2016-12-25 LAB — AMMONIA: Ammonia: 34 umol/L (ref 9–35)

## 2016-12-25 LAB — CBC
HCT: 35.6 % — ABNORMAL LOW (ref 39.0–52.0)
Hemoglobin: 11.4 g/dL — ABNORMAL LOW (ref 13.0–17.0)
MCH: 28.7 pg (ref 26.0–34.0)
MCHC: 32 g/dL (ref 30.0–36.0)
MCV: 89.7 fL (ref 78.0–100.0)
PLATELETS: 171 10*3/uL (ref 150–400)
RBC: 3.97 MIL/uL — AB (ref 4.22–5.81)
RDW: 14.2 % (ref 11.5–15.5)
WBC: 3.1 10*3/uL — AB (ref 4.0–10.5)

## 2016-12-25 LAB — BASIC METABOLIC PANEL
ANION GAP: 8 (ref 5–15)
BUN: 14 mg/dL (ref 6–20)
CO2: 28 mmol/L (ref 22–32)
Calcium: 9 mg/dL (ref 8.9–10.3)
Chloride: 104 mmol/L (ref 101–111)
Creatinine, Ser: 0.91 mg/dL (ref 0.61–1.24)
GFR calc Af Amer: >60 mL/min (ref >60)
Glucose, Bld: 81 mg/dL (ref 65–99)
POTASSIUM: 3.5 mmol/L (ref 3.5–5.1)
SODIUM: 140 mmol/L (ref 135–145)

## 2016-12-25 MED ORDER — GADOBENATE DIMEGLUMINE 529 MG/ML IV SOLN
20.0000 mL | Freq: Once | INTRAVENOUS | Status: AC | PRN
  Administered 2016-12-25: 20 mL via INTRAVENOUS

## 2016-12-25 NOTE — Care Management Note (Signed)
Case Management Note  Patient Details  Name: Jeremy Brennan MRN: [id number] Date of Birth: 03/15/1965  Subjective/Objective:                    Action/Plan: Pt is from [facility]. Plan [facility]. CM following.  Expected Discharge Date:                  Expected Discharge Plan:  Skilled Nursing Facility  In-House Referral:  Clinical Social Work  Discharge planning Services     Post Acute Care Choice:    Choice offered to:     DME Arranged:    DME Agency:     HH Arranged:    HH Agency:     Status of Service:  In process, will continue to follow  If discussed at Long Length of Stay Meetings, dates discussed:    Additional Comments:  Kermit BaloKelli F Jasper Ruminski, RN 12/25/2016, 2:19 PM

## 2016-12-25 NOTE — Progress Notes (Signed)
PROGRESS NOTE  GEARALD Brennan ONG:[id number] DOB: 1965/08/10 DOA: 12/23/2016 PCP: Jeremy Boys, DO   LOS: 1 day   Brief Narrative / Interim history: 51 year old male with history of major stroke with right-sided hemiplegia, PEG state, currently residing in SNF.  He was admitted in September 2015 with an unresponsive episode, and underwent CPR however not clear whether he actually had a cardiac arrest.  He was admitted and cardiology evaluated patient.  He underwent a 2D echo which was essentially unremarkable.  He was cleared for discharge, readmitted again on 10/17 with a reported witnessed seizure-like activity.  ED physician discussed with the neurologist on-call during admission, and due to significant risks for patient having seizures in the setting of prior stroke recommended empiric Keppra 500 twice daily.  Assessment & Plan: Principal Problem:   Seizure (HCC) Active Problems:   Hemiparesis affecting right side as late effect of stroke [facility])   Essential hypertension   Dysphagia   S/P percutaneous endoscopic gastrostomy (PEG) tube placement [facility])   Seizure episode -Unclear if this is his new onset or the same thing happened back in September.  Dr. Corlis Leak discussed with the neurologist on-call on 10/17 and recommended Keppra 500 mg twice daily empirically. -Patient underwent an EEG on 10/18 which showed diffuse slowing of the waking background, without epileptiform discharges -Obtained a MRI today, no evidence of acute findings, he does have a chronic left PCA infarct and extensive chronic small vessel ischemic changes including old lacunar infarcts.  MRI was also pertinent for numerous chronic microhemorrhages in a pattern consistent with chronic hypertension  History of CVA with right-sided hemiparesis -Currently in an SNF, PEG tube in place  Acute encephalopathy -Patient barely able to open his eyes today, does follow commands but is not interactive and nonverbal.  Ammonia  was checked 10/19 and returned normal at 34.  Urinalysis negative for infectious process.  Chest x-ray on admission without any acute processes.  He is afebrile.  Elevated troponin -Point-of-care was slightly elevated, repeat troponins negative x3.  He was evaluated by cardiology just a few weeks ago when he was hospitalized.  2D echo done on December 02, 2016 showed EF of 50%, grade 1 diastolic dysfunction without significant valvular abnormalities.   DVT prophylaxis: Lovenox Code Status: Full code Family Communication: no family at bedside Disposition Plan: SNF 1 day if stable  Consultants:   None   Procedures:   EEG  Antimicrobials:  None    Subjective: -Minimally interactive, keeps his eyes closed.  Follows commands as he is squeezing my fingers and wiggle his toes on the left side  Objective: Vitals:   12/24/16 2058 12/25/16 0056 12/25/16 0453 12/25/16 1252  BP: 119/83 112/78 94/60 109/73  Pulse: 81 74 66 75  Resp: 20 20 20 16   Temp:  98.1 F (36.7 C) 98.2 F (36.8 C) 98.2 F (36.8 C)  TempSrc: Oral Oral Oral Oral  SpO2: 99% 98% 98% 100%  Weight:      Height:        Intake/Output Summary (Last 24 hours) at 12/25/16 1340 Last data filed at 12/25/16 1315  Gross per 24 hour  Intake              357 ml  Output              200 ml  Net              157 ml   Filed Weights   12/23/16 2327  Weight: 86.9  kg (191 lb 9.6 oz)    Examination:  Constitutional: Lethargic Eyes:  lids and conjunctivae normal Respiratory: clear to auscultation bilaterally, no wheezing, no crackles. Normal respiratory effort.  Cardiovascular: Regular rate and rhythm, no murmurs / rubs / gallops. No LE edema.  Abdomen: Bowel sounds positive.  Skin: no rashes, lesions, ulcers. No induration Neurologic: Right-sided hemiparesis.  Strength appears intact on left Psychiatric: non-interactive   Data Reviewed: I have independently reviewed following labs and imaging studies    CBC:  Recent Labs Lab 12/25/16 1603 12/25/16 0601  WBC 5.4 3.1*  HGB 12.8* 11.4*  HCT 38.3* 35.6*  MCV 87.2 89.7  PLT 185 171   Basic Metabolic Panel:  Recent Labs Lab 12-25-16 1614 12/25/16 0601  NA 137 140  K 3.3* 3.5  CL 104 104  CO2 24 28  GLUCOSE 105* 81  BUN 10 14  CREATININE 0.75 0.91  CALCIUM 9.3 9.0   GFR: Estimated Creatinine Clearance: 103 mL/min (by C-G formula based on SCr of 0.91 mg/dL). Liver Function Tests:  Recent Labs Lab 12/25/16 1614  AST 13*  ALT 12*  ALKPHOS 47  BILITOT 0.7  PROT 7.1  ALBUMIN 3.8   No results for input(s): LIPASE, AMYLASE in the last 168 hours.  Recent Labs Lab 12/25/16 0601  AMMONIA 34   Coagulation Profile: No results for input(s): INR, PROTIME in the last 168 hours. Cardiac Enzymes:  Recent Labs Lab 12-25-16 2116 12/24/16 0207 12/24/16 0716  TROPONINI <0.03 <0.03 <0.03   BNP (last 3 results) No results for input(s): PROBNP in the last 8760 hours. HbA1C: No results for input(s): HGBA1C in the last 72 hours. CBG:  Recent Labs Lab 12/24/16 1926 12/24/16 2358 12/25/16 0416 12/25/16 0836 12/25/16 1249  GLUCAP 103* 73 112* 77 70   Lipid Profile: No results for input(s): CHOL, HDL, LDLCALC, TRIG, CHOLHDL, LDLDIRECT in the last 72 hours. Thyroid Function Tests: No results for input(s): TSH, T4TOTAL, FREET4, T3FREE, THYROIDAB in the last 72 hours. Anemia Panel: No results for input(s): VITAMINB12, FOLATE, FERRITIN, TIBC, IRON, RETICCTPCT in the last 72 hours. Urine analysis:    Component Value Date/Time   COLORURINE YELLOW 12-25-16 1624   APPEARANCEUR CLEAR 25-Dec-2016 1624   LABSPEC 1.013 12/25/16 1624   PHURINE 5.0 12-25-16 1624   GLUCOSEU NEGATIVE Dec 25, 2016 1624   HGBUR NEGATIVE Dec 25, 2016 1624   BILIRUBINUR NEGATIVE Dec 25, 2016 1624   KETONESUR NEGATIVE 12-25-2016 1624   PROTEINUR 30 (A) Dec 25, 2016 1624   NITRITE NEGATIVE Dec 25, 2016 1624   LEUKOCYTESUR NEGATIVE December 25, 2016 1624    Sepsis Labs: Invalid input(s): PROCALCITONIN, LACTICIDVEN  No results found for this or any previous visit (from the past 240 hour(s)).    Radiology Studies: Ct Head Wo Contrast  Result Date: Dec 25, 2016 CLINICAL DATA:  Seizure EXAM: CT HEAD WITHOUT CONTRAST TECHNIQUE: Contiguous axial images were obtained from the base of the skull through the vertex without intravenous contrast. COMPARISON:  December 01, 2016 FINDINGS: Brain: The ventricles are within normal limits with respect to size and configuration. There is no intracranial mass, hemorrhage, extra-axial fluid collection, or midline shift. There is evidence of a prior infarct in the medial left occipital lobe, stable. This infarct also involves a portion of the posterior left temporal lobe, stable. There is evidence of prior infarcts in the left lentiform nucleus. There is decreased attenuation throughout the centra semiovale bilaterally, advanced for age. There is decreased attenuation in the mid pons in the basilar perforator distribution region. There is no new gray-white compartment lesion.  No evident acute infarct. Vascular: No hyperdense vessel is appreciable. There is calcification in both carotid siphon regions, age advanced. Skull: Bony calvarium appears intact. Sinuses/Orbits: Paranasal sinuses are clear. Orbits appear symmetric bilaterally. Other: Mastoid air cells are clear. There is debris in both external auditory canals. IMPRESSION: 1. Multifocal infarcts. Extensive periventricular decreased attenuation, suspect small vessel vascular disease, age advanced, although a degree of superimposed demyelination cannot be excluded. Small vessel disease also noted in the basilar perforator distribution of the mid pons. No acute infarct evident. No intracranial mass, hemorrhage, or extra-axial fluid collection. 2.  Foci of arterial vascular calcification, age advanced. 3.  Presumed cerumen in each external auditory canal. Electronically  Signed   By: Bretta BangWilliam  Woodruff III M.D.   On: 12/23/2016 15:56   Mr Laqueta JeanBrain W ZOWo Contrast  Result Date: 12/25/2016 CLINICAL DATA:  Seizure like activity. Encephalopathy. Prior stroke. EXAM: MRI HEAD WITHOUT AND WITH CONTRAST TECHNIQUE: Multiplanar, multiecho pulse sequences of the brain and surrounding structures were obtained without and with intravenous contrast. CONTRAST:  20mL MULTIHANCE GADOBENATE DIMEGLUMINE 529 MG/ML IV SOLN COMPARISON:  Head CT 12/23/2016 FINDINGS: Multiple sequences are moderately motion degraded. The sagittal T1 sequence is severely motion degraded. Brain: There is no evidence of acute infarct, mass, midline shift, or extra-axial fluid collection. There are multiple chronic microhemorrhages in the cerebellum and brainstem. Numerous chronic supratentorial microhemorrhages predominantly involve the deep gray nuclei. There is a chronic large left PCA infarct. Patchy to confluent T2 hyperintensities throughout the cerebral white matter and pons are consistent with chronic small vessel ischemia, markedly advanced for age. There are chronic lacunar infarcts in the deep cerebral white matter bilaterally, bilateral basal ganglia, left thalamus, and pons. There is age advanced cerebral and cerebellar atrophy. No abnormal enhancement is identified. Detailed assessment of the mesial temporal lobe structures is limited by motion artifact, however there is left temporal encephalomalacia related to the chronic infarct with involvement of the hippocampus. Vascular: Major intracranial vascular flow voids are preserved. Skull and upper cervical spine: No suspicious marrow lesion. Sinuses/Orbits: Unremarkable orbits. Paranasal sinuses and mastoid air cells are clear. Other: None. IMPRESSION: 1. Motion degraded examination without evidence of acute intracranial abnormality. 2. Chronic left PCA infarct. 3. Extensive chronic small vessel ischemic changes including chronic lacunar infarcts as above. 4.  Numerous chronic microhemorrhages in a pattern consistent with chronic hypertension. Electronically Signed   By: Sebastian AcheAllen  Grady M.D.   On: 12/25/2016 12:40   Dg Chest Portable 1 View  Result Date: 12/23/2016 CLINICAL DATA:  Seizure activity.  History of previous CVA. EXAM: PORTABLE CHEST 1 VIEW COMPARISON:  Chest x-ray of December 01, 2016 FINDINGS: The lungs are adequately inflated and clear. The heart and pulmonary vascularity are normal. The mediastinum is normal in width. There is mild tortuosity of the ascending and descending thoracic aorta. The bony thorax is unremarkable. IMPRESSION: There is no pneumonia nor other acute cardiopulmonary abnormality. Electronically Signed   By: David  SwazilandJordan M.D.   On: 12/23/2016 14:46     Scheduled Meds: . amLODipine  10 mg Per Tube Daily  . aspirin  325 mg Per Tube Daily  . atorvastatin  20 mg Per Tube QHS  . enoxaparin (LOVENOX) injection  40 mg Subcutaneous Q24H  . feeding supplement (JEVITY 1.5 CAL/FIBER)  237 mL Per Tube TID PC  . FLUoxetine  20 mg Per Tube Daily  . hydrALAZINE  25 mg Per Tube BID  . levETIRAcetam  500 mg Per Tube BID  . risperiDONE  1 mg Per Tube QHS  . tiZANidine  2 mg Oral BID   Continuous Infusions:   Pamella Pert, MD, PhD [facility] Pager [phone]  If 7PM-7AM, please contact night-coverage [email or web address] Password [email or web address] 12/25/2016, 1:40 PM

## 2016-12-26 LAB — GLUCOSE, CAPILLARY
GLUCOSE-CAPILLARY: 75 mg/dL (ref 65–99)
GLUCOSE-CAPILLARY: 88 mg/dL (ref 65–99)
Glucose-Capillary: 107 mg/dL — ABNORMAL HIGH (ref 65–99)
Glucose-Capillary: 71 mg/dL (ref 65–99)
Glucose-Capillary: 95 mg/dL (ref 65–99)

## 2016-12-26 MED ORDER — LEVETIRACETAM 100 MG/ML PO SOLN: 500.0000 mg | Freq: Two times a day (BID) | ORAL | 12 refills | Status: DC

## 2016-12-26 NOTE — Progress Notes (Signed)
CSW attempting to reach [facility] to determine if patient is able to return today.   Stacy GardnerErin Alissia Lory, [facility] Clinical Social Worker [phone]

## 2016-12-26 NOTE — Discharge Summary (Addendum)
Physician Discharge Summary  Jeremy Brennan WUJ:[id number] DOB: 26-Mar-1965 DOA: 12/23/2016  PCP: Kirt Boys, DO  Admit date: 12/23/2016 Discharge date: 12/26/2016  Admitted From: SNF Disposition:  SNF  Recommendations for Outpatient Follow-up:  1. Follow up with PCP in 1-2 weeks 2. Ambulatory referral made to neurology 3. Started on Keppra during this hospitalization  Home Health: none Equipment/Devices: none  Discharge Condition: stable CODE STATUS: Full code Diet recommendation: NPO, feeding per G tube  HPI: Per Dr. Julian Reil, Jeremy Brennan is a 51 y.o. male with medical history significant of major stroke.  Had been in SNF for rehab when he had a syncopal episode on 9/25 last month.  He had been left alone for 10 mins, and when he was found unresponsive, CPR was started at that time, patient woke up to chest compressions after 3 mins of CPR.  Unclear overall if he did or didn't have actual cardiac arrest vs an unwitnessed seizure (please see my partner's H+P and Discharge summaries from last admission from 9/25-9/27 for a full discussion here). Regardless, today he had a reported witnessed event.  NH reports "shaking activity" during lunch.  No urination, no tongue laceration.  Patient back to baseline mental status in the ED.  Hospital Course: Discharge Diagnoses:  Principal Problem:   Seizure [facility]) Active Problems:   Hemiparesis affecting right side as late effect of stroke (HCC)   Essential hypertension   Dysphagia   S/P percutaneous endoscopic gastrostomy (PEG) tube placement [facility])   Seizure episode -patient was admitted to the hospital with a seizure episode.  It is unclear if this is a new onset seizure or the same thing happened back in September when he was hospitalized with a syncopal episode. Dr. Corlis Leak from ED discussed with the neurologist on-call on 10/17 and recommended Keppra 500 mg twice daily empirically given significant prior CVAs which increases  risk of having seizures. Patient underwent an EEG on 10/18 which showed diffuse slowing of the waking background, without epileptiform discharges.  He also underwent an MRI without any evidence of acute findings, showed chronic left PCA infarct and extensive chronic small vessel ischemic changes including old lacunar infarcts.  MRI was also pertinent for numerous chronic microhemorrhages in a pattern consistent with chronic hypertension.  He tolerated Keppra well, had no further seizure episodes and appears to mental status has returned to baseline. History of CVA with right-sided hemiparesis -Currently in an SNF, PEG tube in place Acute encephalopathy -improving, patient is quite selective with whom he interacts.  Appears alert, follows commands. Ammonia was checked 10/19 and returned normal at 34.  Urinalysis negative for infectious process.  Chest x-ray on admission without any acute processes.  He is afebrile. Elevated troponin -Point-of-care was slightly elevated on admission, repeat troponins negative x3.  He was evaluated by cardiology just a few weeks ago when he was hospitalized.  2D echo done on December 02, 2016 showed EF of 50%, grade 1 diastolic dysfunction without significant valvular abnormalities.    Discharge Instructions   Allergies as of 12/26/2016   No Known Allergies     Medication List    TAKE these medications   amLODipine 10 MG tablet Commonly known as:  NORVASC Take 1 tablet (10 mg total) by mouth daily. What changed:  how to take this   aspirin 325 MG tablet Place 325 mg into feeding tube daily.   atorvastatin 20 MG tablet Commonly known as:  LIPITOR Place 20 mg into feeding tube at bedtime.  feeding supplement (JEVITY 1.5 CAL/FIBER) Liqd Place 237 mLs into feeding tube 3 (three) times daily with meals as needed (Administer 1 can if po intake of meal is <50%).   FLUoxetine 20 MG capsule Commonly known as:  PROZAC Place 20 mg into feeding tube daily.     hydrALAZINE 25 MG tablet Commonly known as:  APRESOLINE Take 1 tablet (25 mg total) by mouth 2 (two) times daily. What changed:  how to take this   levETIRAcetam 100 MG/ML solution Commonly known as:  KEPPRA Place 5 mLs (500 mg total) into feeding tube 2 (two) times daily.   risperiDONE 1 MG tablet Commonly known as:  RISPERDAL Place 1 mg into feeding tube at bedtime.   tizanidine 2 MG capsule Commonly known as:  ZANAFLEX Place 2 mg into feeding tube 2 (two) times daily.   tobramycin 0.3 % ophthalmic solution Commonly known as:  TOBREX Place 1 drop into both eyes 4 (four) times daily. For  6 days; started on 12-17-16      Contact information for after-discharge care    Destination    [facility] Follow up.   Specialty:  Skilled Nursing Facility Contact information: [address] [place] [address] [phone]              Consultations:  None   Procedures/Studies:  Ct Head Wo Contrast  Result Date: 12/23/2016 CLINICAL DATA:  Seizure EXAM: CT HEAD WITHOUT CONTRAST TECHNIQUE: Contiguous axial images were obtained from the base of the skull through the vertex without intravenous contrast. COMPARISON:  December 01, 2016 FINDINGS: Brain: The ventricles are within normal limits with respect to size and configuration. There is no intracranial mass, hemorrhage, extra-axial fluid collection, or midline shift. There is evidence of a prior infarct in the medial left occipital lobe, stable. This infarct also involves a portion of the posterior left temporal lobe, stable. There is evidence of prior infarcts in the left lentiform nucleus. There is decreased attenuation throughout the centra semiovale bilaterally, advanced for age. There is decreased attenuation in the mid pons in the basilar perforator distribution region. There is no new gray-white compartment lesion. No evident acute infarct. Vascular: No hyperdense vessel is  appreciable. There is calcification in both carotid siphon regions, age advanced. Skull: Bony calvarium appears intact. Sinuses/Orbits: Paranasal sinuses are clear. Orbits appear symmetric bilaterally. Other: Mastoid air cells are clear. There is debris in both external auditory canals. IMPRESSION: 1. Multifocal infarcts. Extensive periventricular decreased attenuation, suspect small vessel vascular disease, age advanced, although a degree of superimposed demyelination cannot be excluded. Small vessel disease also noted in the basilar perforator distribution of the mid pons. No acute infarct evident. No intracranial mass, hemorrhage, or extra-axial fluid collection. 2.  Foci of arterial vascular calcification, age advanced. 3.  Presumed cerumen in each external auditory canal. Electronically Signed   By: Bretta Bang III M.D.   On: 12/23/2016 15:56   Ct Head Wo Contrast  Result Date: 12/01/2016 CLINICAL DATA:  51 year old presenting with acute mental status changes, becoming unresponsive at the nursing home and unable to answer questions thereafter personal history of stroke. EXAM: CT HEAD WITHOUT CONTRAST TECHNIQUE: Contiguous axial images were obtained from the base of the skull through the vertex without intravenous contrast. COMPARISON:  None. FINDINGS: Motion blurred several of the images during the examination but the study appears diagnostic. Brain: Head tilt in the gantry accounts for apparent asymmetry. Encephalomalacia involving the left occipital lobe and the  left posterior temporal lobe. Low attenuation involving pons. Ventricular system normal in size and appearance for age. Severe changes of small vessel disease of the white matter diffusely No mass lesion. No midline shift. No acute hemorrhage or hematoma. No extra-axial fluid collections. No evidence of acute infarction. Vascular: Mild bilateral carotid siphon and left vertebral artery atherosclerosis. Atretic right vertebral artery. No  hyperdense vessel. Skull: No skull fracture or other focal osseous abnormality involving the skull. Sinuses/Orbits: Visualized paranasal sinuses, bilateral mastoid air cells and bilateral middle ear cavities well-aerated. Visualized orbits and globes are normal. Other: None. IMPRESSION: 1. No acute intracranial abnormality. 2. Remote cortical stroke involving the left occipital lobe and left superior temporal lobe (left posterior cerebral artery distribution). 3. Remote stroke involving the pons. 4. Severe chronic microvascular ischemic changes involving the white matter. Electronically Signed   By: Hulan Saas M.D.   On: 12/01/2016 14:35   Ct Angio Chest Pe W And/or Wo Contrast  Result Date: 12/01/2016 CLINICAL DATA:  High probability of pulmonary embolism EXAM: CT ANGIOGRAPHY CHEST WITH CONTRAST TECHNIQUE: Multidetector CT imaging of the chest was performed using the standard protocol during bolus administration of intravenous contrast. Multiplanar CT image reconstructions and MIPs were obtained to evaluate the vascular anatomy. CONTRAST:  100 cc Isovue 370 COMPARISON:  Chest x-ray of 12/01/2016 FINDINGS: Cardiovascular: The heart is mildly enlarged. No pericardial effusion is seen. No significant coronary artery calcifications are evident. The mid ascending thoracic aorta measures 41 mm in diameter. Recommend annual imaging followup by CTA or MRA. This recommendation follows 2010 ACCF/AHA/AATS/ACR/ASA/SCA/SCAI/SIR/STS/SVM Guidelines for the Diagnosis and Management of Patients with Thoracic Aortic Disease. Circulation. 2010; 121: Z610-R604. The pulmonary artery opacified well. No evidence of acute pulmonary embolism is seen. Linear artifacts do obscure some detail within the distal branches to the lower lobes. Mediastinum/Nodes: There may be a small hiatal hernia present. No mediastinal or hilar adenopathy is seen. The thyroid gland is unremarkable. Lungs/Pleura: On lung window images, no pneumonia is  seen and there is no evidence of pleural effusion. No suspicious lung nodule is noted. The central airway is patent. Upper Abdomen: Images through the upper abdomen show no significant abnormality. The stomach is relatively distended with fluid. Musculoskeletal: There are mild degenerative changes throughout the thoracic spine diffusely. No compression deformity is seen. Review of the MIP images confirms the above findings. IMPRESSION: 1. No evidence of acute pulmonary embolism is seen. 2. Fusiform dilatation of the ascending thoracic aorta measuring up to 41 mm in diameter. Recommend annual imaging followup by CTA or MRA. This recommendation follows 2010 ACCF/AHA/AATS/ACR/ASA/SCA/SCAI/SIR/STS/SVM Guidelines for the Diagnosis and Management of Patients with Thoracic Aortic Disease. Circulation. 2010; 121: V409-W119 3. Possible small hiatal hernia. Electronically Signed   By: Dwyane Dee M.D.   On: 12/01/2016 16:23   Mr Laqueta Jean JY Contrast  Result Date: 12/25/2016 CLINICAL DATA:  Seizure like activity. Encephalopathy. Prior stroke. EXAM: MRI HEAD WITHOUT AND WITH CONTRAST TECHNIQUE: Multiplanar, multiecho pulse sequences of the brain and surrounding structures were obtained without and with intravenous contrast. CONTRAST:  20mL MULTIHANCE GADOBENATE DIMEGLUMINE 529 MG/ML IV SOLN COMPARISON:  Head CT 12/23/2016 FINDINGS: Multiple sequences are moderately motion degraded. The sagittal T1 sequence is severely motion degraded. Brain: There is no evidence of acute infarct, mass, midline shift, or extra-axial fluid collection. There are multiple chronic microhemorrhages in the cerebellum and brainstem. Numerous chronic supratentorial microhemorrhages predominantly involve the deep gray nuclei. There is a chronic large left PCA infarct. Patchy to confluent T2  hyperintensities throughout the cerebral white matter and pons are consistent with chronic small vessel ischemia, markedly advanced for age. There are chronic  lacunar infarcts in the deep cerebral white matter bilaterally, bilateral basal ganglia, left thalamus, and pons. There is age advanced cerebral and cerebellar atrophy. No abnormal enhancement is identified. Detailed assessment of the mesial temporal lobe structures is limited by motion artifact, however there is left temporal encephalomalacia related to the chronic infarct with involvement of the hippocampus. Vascular: Major intracranial vascular flow voids are preserved. Skull and upper cervical spine: No suspicious marrow lesion. Sinuses/Orbits: Unremarkable orbits. Paranasal sinuses and mastoid air cells are clear. Other: None. IMPRESSION: 1. Motion degraded examination without evidence of acute intracranial abnormality. 2. Chronic left PCA infarct. 3. Extensive chronic small vessel ischemic changes including chronic lacunar infarcts as above. 4. Numerous chronic microhemorrhages in a pattern consistent with chronic hypertension. Electronically Signed   By: Sebastian Ache M.D.   On: 12/25/2016 12:40   Dg Chest Portable 1 View  Result Date: 12/23/2016 CLINICAL DATA:  Seizure activity.  History of previous CVA. EXAM: PORTABLE CHEST 1 VIEW COMPARISON:  Chest x-ray of December 01, 2016 FINDINGS: The lungs are adequately inflated and clear. The heart and pulmonary vascularity are normal. The mediastinum is normal in width. There is mild tortuosity of the ascending and descending thoracic aorta. The bony thorax is unremarkable. IMPRESSION: There is no pneumonia nor other acute cardiopulmonary abnormality. Electronically Signed   By: David  [place] M.D.   On: 12/23/2016 14:46   Dg Chest Portable 1 View  Result Date: 12/01/2016 CLINICAL DATA:  Status post CVA last month with persistent right-sided weakness. Patient was found unresponsive in the wheelchair this morning without apparent cause. The patient underwent CPR for 3 minutes. EXAM: PORTABLE CHEST 1 VIEW COMPARISON:  None in PACs FINDINGS: The lungs are  adequately inflated and clear. The heart is mildly enlarged. The pulmonary vascularity is normal. The mediastinum is normal in width. The bony thorax is unremarkable. External pacemaker defibrillator pads are present. IMPRESSION: Mild cardiomegaly without pulmonary edema. No acute cardiopulmonary abnormality. Electronically Signed   By: David  [place] M.D.   On: 12/01/2016 12:41      Subjective: - no chest pain, shortness of breath, no abdominal pain, nausea or vomiting.   Discharge Exam: Vitals:   12/26/16 0844 12/26/16 1048  BP: 119/76 100/66  Pulse:  89  Resp:  17  Temp:  98.1 F (36.7 C)  SpO2:  99%    General: Pt is alert, awake, not in acute distress, right sided hemiparesis, following commands Cardiovascular: RRR, S1/S2 +, no rubs, no gallops Respiratory: CTA bilaterally, no wheezing, no rhonchi Abdominal: Soft, NT, ND, bowel sounds +    The results of significant diagnostics from this hospitalization (including imaging, microbiology, ancillary and laboratory) are listed below for reference.     Microbiology: No results found for this or any previous visit (from the past 240 hour(s)).   Labs: BNP (last 3 results) No results for input(s): BNP in the last 8760 hours. Basic Metabolic Panel:  Recent Labs Lab 12/23/16 1614 12/25/16 0601  NA 137 140  K 3.3* 3.5  CL 104 104  CO2 24 28  GLUCOSE 105* 81  BUN 10 14  CREATININE 0.75 0.91  CALCIUM 9.3 9.0   Liver Function Tests:  Recent Labs Lab 12/23/16 1614  AST 13*  ALT 12*  ALKPHOS 47  BILITOT 0.7  PROT 7.1  ALBUMIN 3.8   No results for input(s):  LIPASE, AMYLASE in the last 168 hours.  Recent Labs Lab 12/25/16 0601  AMMONIA 34   CBC:  Recent Labs Lab 12/23/16 1603 12/25/16 0601  WBC 5.4 3.1*  HGB 12.8* 11.4*  HCT 38.3* 35.6*  MCV 87.2 89.7  PLT 185 171   Cardiac Enzymes:  Recent Labs Lab 12/23/16 2116 12/24/16 0207 12/24/16 0716  TROPONINI <0.03 <0.03 <0.03   BNP: Invalid  input(s): POCBNP CBG:  Recent Labs Lab 12/25/16 1642 12/25/16 1936 12/26/16 0018 12/26/16 0403 12/26/16 0805  GLUCAP 126* 93 71 75 88   D-Dimer No results for input(s): DDIMER in the last 72 hours. Hgb A1c No results for input(s): HGBA1C in the last 72 hours. Lipid Profile No results for input(s): CHOL, HDL, LDLCALC, TRIG, CHOLHDL, LDLDIRECT in the last 72 hours. Thyroid function studies No results for input(s): TSH, T4TOTAL, T3FREE, THYROIDAB in the last 72 hours.  Invalid input(s): FREET3 Anemia work up No results for input(s): VITAMINB12, FOLATE, FERRITIN, TIBC, IRON, RETICCTPCT in the last 72 hours. Urinalysis    Component Value Date/Time   COLORURINE YELLOW 12/23/2016 1624   APPEARANCEUR CLEAR 12/23/2016 1624   LABSPEC 1.013 12/23/2016 1624   PHURINE 5.0 12/23/2016 1624   GLUCOSEU NEGATIVE 12/23/2016 1624   HGBUR NEGATIVE 12/23/2016 1624   BILIRUBINUR NEGATIVE 12/23/2016 1624   KETONESUR NEGATIVE 12/23/2016 1624   PROTEINUR 30 (A) 12/23/2016 1624   NITRITE NEGATIVE 12/23/2016 1624   LEUKOCYTESUR NEGATIVE 12/23/2016 1624   Sepsis Labs Invalid input(s): PROCALCITONIN,  WBC,  LACTICIDVEN   Time coordinating discharge: 35 minutes  SIGNED:  Pamella Pertostin Gherghe, MD  [facility] 12/26/2016, 11:37 AM Pager [phone]  If 7PM-7AM, please contact night-coverage [email or web address] Password [email or web address]

## 2016-12-26 NOTE — NC FL2 (Signed)
[place] MEDICAID FL2 LEVEL OF CARE SCREENING TOOL     IDENTIFICATION  Patient Name: Jeremy Brennan Birthdate: 02/15/1966 Sex: male Admission Date (Current Location): 12/23/2016  [facility] [place]   Facility and Address:  [facility] [place], [address], [place], [place] [address]      Provider Number: [id number]  Attending Physician Name and Address:  Leatha GildingGherghe, Costin M, MD  Relative Name and Phone Number:       Current Level of Care: Hospital Recommended Level of Care: Skilled Nursing Facility Prior Approval Number:    Date Approved/Denied:   PASRR Number: [id number]  Discharge Plan: SNF    Current Diagnoses: Patient Active Problem List   Diagnosis Date Noted  . Seizure (HCC) 12/23/2016  . Chronic cerebrovascular accident (CVA) 12/01/2016  . Depression with suicidal ideation 12/01/2016  . Sudden cardiac arrest (HCC) 12/01/2016  . Unresponsive 12/01/2016  . Bradycardia 12/01/2016  . Hemiparesis affecting right side as late effect of stroke (HCC) 11/01/2016  . Contracture of joint of left hand 11/01/2016  . History of CVA (cerebrovascular accident) 11/01/2016  . Essential hypertension 11/01/2016  . Depression, recurrent (HCC) 11/01/2016  . Mixed hyperlipidemia 11/01/2016  . Dysphagia 11/01/2016  . S/P percutaneous endoscopic gastrostomy (PEG) tube placement (HCC) 11/01/2016  . Dysarthria 11/01/2016    Orientation RESPIRATION BLADDER Height & Weight      (Disoriented X4)  Normal Incontinent Weight: 191 lb 9.6 oz (86.9 kg) Height:  6' (182.9 cm)  BEHAVIORAL SYMPTOMS/MOOD NEUROLOGICAL BOWEL NUTRITION STATUS      Incontinent Diet (Diet DYS 2)  AMBULATORY STATUS COMMUNICATION OF NEEDS Skin   Limited Assist Verbally Normal                       Personal Care Assistance Level of Assistance  Bathing, Feeding, Dressing Bathing Assistance: Maximum assistance Feeding assistance: Maximum assistance Dressing  Assistance: Maximum assistance     Functional Limitations Info    Sight Info: Adequate Hearing Info: Adequate Speech Info: Adequate    SPECIAL CARE FACTORS FREQUENCY                       Contractures      Additional Factors Info  Code Status, Allergies Code Status Info: Full Code  Allergies Info: NKA            Current Medications (12/26/2016):  This is the current hospital active medication list Current Facility-Administered Medications  Medication Dose Route Frequency Provider Last Rate Last Dose  . acetaminophen (TYLENOL) tablet 650 mg  650 mg Oral Q6H PRN Hillary BowGardner, Jared M, DO       Or  . acetaminophen (TYLENOL) suppository 650 mg  650 mg Rectal Q6H PRN Hillary BowGardner, Jared M, DO      . amLODipine (NORVASC) tablet 10 mg  10 mg Per Tube Daily Lyda PeroneGardner, Jared M, DO   10 mg at 12/26/16 0844  . aspirin tablet 325 mg  325 mg Per Tube Daily Hillary BowGardner, Jared M, DO   325 mg at 12/26/16 0847  . atorvastatin (LIPITOR) tablet 20 mg  20 mg Per Tube QHS Hillary BowGardner, Jared M, DO   20 mg at 12/25/16 2127  . enoxaparin (LOVENOX) injection 40 mg  40 mg Subcutaneous Q24H Lyda PeroneGardner, Jared M, DO   40 mg at 12/25/16 2126  . feeding supplement (JEVITY 1.5 CAL/FIBER) liquid 237 mL  237 mL Per Tube TID PC Vann, Jessica U, DO  237 mL at 12/26/16 0800  . FLUoxetine (PROZAC) capsule 20 mg  20 mg Per Tube Daily Lyda Perone M, DO   20 mg at 12/26/16 0846  . hydrALAZINE (APRESOLINE) tablet 25 mg  25 mg Per Tube BID Lyda Perone M, DO   25 mg at 12/26/16 0847  . levETIRAcetam (KEPPRA) 100 MG/ML solution 500 mg  500 mg Per Tube BID Lyda Perone M, DO   500 mg at 12/26/16 0844  . ondansetron (ZOFRAN) tablet 4 mg  4 mg Oral Q6H PRN Hillary Bow, DO       Or  . ondansetron [facility]) injection 4 mg  4 mg Intravenous Q6H PRN Hillary Bow, DO      . risperiDONE (RISPERDAL) tablet 1 mg  1 mg Per Tube QHS Hillary Bow, DO   1 mg at 12/25/16 2127  . tiZANidine (ZANAFLEX) tablet 2 mg  2 mg Oral BID  Hillary Bow, DO   2 mg at 12/26/16 1610     Discharge Medications: Please see discharge summary for a list of discharge medications.  Relevant Imaging Results:  Relevant Lab Results:   Additional Information SS#: [id number]  Donnie Coffin, LCSW

## 2016-12-26 NOTE — Progress Notes (Signed)
[facility] is [facility] to [facility] today. CSW attempted to contact patients relative, Midge AverWillie Gillion [phone], with no answer. Patient is a long term resident at facility.  Discharge packet given to RN. [facility] called for transport.    Stacy GardnerErin Etoy Mcdonnell, [facility]apolisCSWA Emergency Room Clinical Social Worker [phone]

## 2016-12-26 NOTE — Progress Notes (Signed)
[facility] called in [facility]. Patient and family were updated.

## 2016-12-28 ENCOUNTER — Ambulatory Visit: Payer: Self-pay | Admitting: Neurology

## 2016-12-28 ENCOUNTER — Telehealth: Payer: Self-pay | Admitting: *Deleted

## 2016-12-28 ENCOUNTER — Encounter: Payer: Self-pay | Admitting: Adult Health

## 2016-12-28 ENCOUNTER — Non-Acute Institutional Stay (SKILLED_NURSING_FACILITY): Payer: Medicaid Other | Admitting: Adult Health

## 2016-12-28 DIAGNOSIS — Z931 Gastrostomy status: Secondary | ICD-10-CM

## 2016-12-28 DIAGNOSIS — R45851 Suicidal ideations: Secondary | ICD-10-CM

## 2016-12-28 DIAGNOSIS — I10 Essential (primary) hypertension: Secondary | ICD-10-CM

## 2016-12-28 DIAGNOSIS — F329 Major depressive disorder, single episode, unspecified: Secondary | ICD-10-CM

## 2016-12-28 DIAGNOSIS — E782 Mixed hyperlipidemia: Secondary | ICD-10-CM | POA: Diagnosis not present

## 2016-12-28 DIAGNOSIS — I6930 Unspecified sequelae of cerebral infarction: Secondary | ICD-10-CM

## 2016-12-28 DIAGNOSIS — R001 Bradycardia, unspecified: Secondary | ICD-10-CM | POA: Diagnosis not present

## 2016-12-28 DIAGNOSIS — R1310 Dysphagia, unspecified: Secondary | ICD-10-CM | POA: Diagnosis not present

## 2016-12-28 DIAGNOSIS — R569 Unspecified convulsions: Secondary | ICD-10-CM | POA: Diagnosis not present

## 2016-12-28 DIAGNOSIS — I69351 Hemiplegia and hemiparesis following cerebral infarction affecting right dominant side: Secondary | ICD-10-CM

## 2016-12-28 NOTE — Progress Notes (Signed)
Location:   [facility] Room Number: 229 B Place of Service:  SNF (31)   CODE STATUS: Full Code  No Known Allergies  Chief Complaint  Patient presents with  . Acute Visit    Hospitalization follow up    HPI:  He is a long term resident of this facility being seen after a hospitalization from 12-23-16 through 12-26-16 for new onset seizures. He was started on keppra 500 mg twice daily he did have an EEG and MRI performed. He is unable to participate in the hpi or ros. There are no reports of further seizure activity. He is dependent at this time for peg tube feedings to supplement his po intake. There are no nursing concerns at this time.    Past Medical History:  Diagnosis Date  . Arthritis   . Contracture, left hand   . 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 Depression   . Hyperlipidemia   . Hypertension   . Stroke [facility])     Past Surgical History:  Procedure Laterality Date  . HERNIA REPAIR    . PEG TUBE PLACEMENT  07/01/2016    Social History   Social History  . Marital status: Widowed    Spouse name: N/A  . Number of children: N/A  . Years of education: N/A   Occupational History  . Not on file.   Social History Main Topics  . Smoking status: Never Smoker  . Smokeless tobacco: Never Used  . Alcohol use Not on file  . Drug use: No  . Sexual activity: Not on file   Other Topics Concern  . Not on file   Social History Narrative  . No narrative on file   Family History  Problem Relation Age of Onset  . Hypertension Other       VITAL SIGNS BP (!) 146/88   Pulse 91   Temp (!) 96.9 F (36.1 C)   Resp (!) 22   Ht 6' (1.829 m)   Wt 178 lb 8 oz (81 kg)   SpO2 97%   BMI 24.21 kg/m   Patient's Medications  New Prescriptions   No medications on file  Previous Medications   AMLODIPINE (NORVASC) 10 MG TABLET    Place 10 mg into feeding tube daily.   ASPIRIN 325 MG TABLET    Place 325 mg into feeding tube daily.   ATORVASTATIN (LIPITOR)  20 MG TABLET    Place 20 mg into feeding tube at bedtime.    FLUOXETINE (PROZAC) 20 MG CAPSULE    Place 20 mg into feeding tube daily.   HYDRALAZINE (APRESOLINE) 25 MG TABLET    Place 25 mg into feeding tube 2 (two) times daily.   LEVETIRACETAM (KEPPRA) 100 MG/ML SOLUTION    Place 5 mLs (500 mg total) into feeding tube 2 (two) times daily.   NUTRITIONAL SUPPLEMENTS (FEEDING SUPPLEMENT, JEVITY 1.5 CAL/FIBER,) LIQD    Place 237 mLs into feeding tube 3 (three) times daily with meals as needed (Administer 1 can if po intake of meal is <50%).   RISPERIDONE (RISPERDAL) 1 MG TABLET    Place 1 mg into feeding tube at bedtime.   TIZANIDINE (ZANAFLEX) 2 MG CAPSULE    Place 2 mg into feeding tube 2 (two) times daily.  Modified Medications   No medications on file  Discontinued Medications   AMLODIPINE (NORVASC) 10 MG TABLET    Take 1 tablet (10 mg total) by mouth daily.   HYDRALAZINE (APRESOLINE)  25 MG TABLET    Take 1 tablet (25 mg total) by mouth 2 (two) times daily.   TOBRAMYCIN (TOBREX) 0.3 % OPHTHALMIC SOLUTION    Place 1 drop into both eyes 4 (four) times daily. For  6 days; started on 12-17-16     SIGNIFICANT DIAGNOSTIC EXAMS   PREVIOUS:   12-01-16: chest x-ray: Mild cardiomegaly without pulmonary edema. No acute cardiopulmonary abnormality.  12-01-16: ct of head:  1. No acute intracranial abnormality. 2. Remote cortical stroke involving the left occipital lobe and left superior temporal lobe (left posterior cerebral artery distribution). 3. Remote stroke involving the pons. 4. Severe chronic microvascular ischemic changes involving the white Matter.  12-01-16: ct angio of chest:  1. No evidence of acute pulmonary embolism is seen. 2. Fusiform dilatation of the ascending thoracic aorta measuring up to 41 mm in diameter. Recommend annual imaging followup by CTA or MRA.  3. Possible small hiatal hernia.  12-02-16: EEG:  EEG Abnormalities: None Clinical Interpretation: This normal EEG is  recorded in the waking state. There was no seizure or seizure predisposition recorded on this study. Please note that a normal EEG does not preclude the possibility of epilepsy.    12-02-16: 2-d echo:  - Left ventricle: The cavity size was mildly dilated. Wall motion  was normal; there were no regional wall motion abnormalities. Mildly reduced global EF 50%   Doppler parameters are consistent with abnormal left ventricular relaxation (grade 1 diastolic dysfunction). - No significant valvular abnormality - Mild dilatation of aortic root (4.1 cm) and proximal ascending aorta (3.9 cm)   TODAY:   12-23-16: chest x-ray: There is no pneumonia nor other acute cardiopulmonary abnormality.  12-23-16: ct of head: 1. Multifocal infarcts. Extensive periventricular decreased attenuation, suspect small vessel vascular disease, age advanced, although a degree of superimposed demyelination cannot be excluded. Small vessel disease also noted in the basilar perforator distribution of the mid pons. No acute infarct evident. No intracranial mass, hemorrhage, or extra-axial fluid collection. 2.  Foci of arterial vascular calcification, age advanced. 3.  Presumed cerumen in each external auditory canal.  12-24-16: EEG: This awake EEG is abnormal due to diffuse slowing of the waking background.   12-25-16: MRI: of brain:  1. Motion degraded examination without evidence of acute intracranial abnormality. 2. Chronic left PCA infarct. 3. Extensive chronic small vessel ischemic changes including chronic lacunar infarcts as above. 4. Numerous chronic microhemorrhages in a pattern consistent with chronic hypertension.    LABS REVIEWED; PREVIOUS  10-26-16: wbc 4.6; hgb 13.5; hct 41.7; mcv 91.8; plt 166 glucose 100; bun 17.2; creat 0.72; k+ 4.4 ;na++ 141; liver normal albumin 3.7 hgb a1c 4.5  11-05-16: glucose 85; bun 9.3; creat 0.66; k+ 4.2; na++ 138; ca 9.7 12-01-16: wbc 2.8; hgb 11.1; hct 33.6; mcv 88.2. plt 131;  glucose 99; bun 17; creat 1.04; k+ 3.7; na++ 138; ca 8.9; liver normal albumin 3.1 12-03-16: wbc 2.8; hgb 12.1; hct 35.6; mcv 86.0; plt 143glucose 85; bun 7; creat 0.78; k+ 3.5; na++ 139; ca 9.2  TODAY:   12-23-16: wbc 5.4; hgb 12.8; hct 38.3; mcv 87.2; plt 185; glucose 105; bun 10; creat 0.75; k+ 3.3; na++ 137; ca 9.3; ast 13 alt 12; albumin 3.8 12-25-16: wbc 3.1; hgb 11.4; hct 35.6; mcv 89.7; plt 171; glucose 81; bun 14; creat 0.91; k+ 3.5; na++ 140; ca 9.0; ammonia 34       Review of Systems  Unable to perform ROS: Other (cva: expressive aphasia)   Physical  Exam  Constitutional: He appears well-developed and well-nourished. No distress.  Eyes: Conjunctivae are normal.  Neck: Neck supple. No thyromegaly present.  Cardiovascular: Normal rate, regular rhythm and intact distal pulses.  Murmur heard. 1/6  Pulmonary/Chest: Effort normal and breath sounds normal. No respiratory distress.  Abdominal: Soft. Bowel sounds are normal. He exhibits no distension. There is no tenderness.  Peg tube present without signs of infection   Musculoskeletal: He exhibits no edema.  Right hemiparesis   Lymphadenopathy:    He has no cervical adenopathy.  Neurological: He is alert.  Skin: Skin is warm and dry. He is not diaphoretic.  Psychiatric: He has a normal mood and affect.   ASSESSMENT/ PLAN:  TODAY:   1.  Hypertension: stable b/p 146/88: will continue norvasc 10 mg daily and apresoline 25 mg twice daily   2. CVA: stable; has hemiparesis on right side with spasticity present: will continue asa 81 mg daily and zanaflex 2 mg twice daily   3. Dyslipidemia: stable will continue lipitor 20 mg daily   4. Dysphagia: stable; no signs of aspiration present: uses peg tube when needed for poor po intake  5.  Bradycardia: stable pulse 91; will avoid beta blockers and will monitor   6. Depression with history of suicidal ideation: is stable will continue prozac 20 mg daily and risperdal 1 mg  nightly  7. New on set seizures: no reports of further seizure activity: will continue keppra 500 mg twice daily    Will setup a neurology consult for his new onset seiures    MD is aware of resident's narcotic use and is in agreement with current plan of care. We will attempt to wean resident as apropriate   Synthia Innocenteborah Azariah Bonura NP [facility]  Contact [phone] Monday through Friday 8am- 5pm  After hours call [phone]

## 2016-12-28 NOTE — Telephone Encounter (Signed)
Patient's legal guardian called at 1:40pm to cancel patient's 2pm new patient appt.  States is was unaware of the appt.

## 2016-12-29 ENCOUNTER — Encounter: Payer: Self-pay | Admitting: Neurology

## 2017-01-03 DIAGNOSIS — H1033 Unspecified acute conjunctivitis, bilateral: Secondary | ICD-10-CM | POA: Insufficient documentation

## 2017-01-27 ENCOUNTER — Encounter: Payer: Self-pay | Admitting: Adult Health

## 2017-01-27 ENCOUNTER — Non-Acute Institutional Stay (SKILLED_NURSING_FACILITY): Payer: Medicaid Other | Admitting: Adult Health

## 2017-01-27 DIAGNOSIS — I69351 Hemiplegia and hemiparesis following cerebral infarction affecting right dominant side: Secondary | ICD-10-CM | POA: Diagnosis not present

## 2017-01-27 DIAGNOSIS — E782 Mixed hyperlipidemia: Secondary | ICD-10-CM

## 2017-01-27 DIAGNOSIS — I10 Essential (primary) hypertension: Secondary | ICD-10-CM | POA: Diagnosis not present

## 2017-01-27 DIAGNOSIS — R1310 Dysphagia, unspecified: Secondary | ICD-10-CM

## 2017-01-27 DIAGNOSIS — I6930 Unspecified sequelae of cerebral infarction: Secondary | ICD-10-CM

## 2017-01-27 NOTE — Progress Notes (Signed)
Location:   [facility] Room Number: 229 B Place of Service:  SNF (31)   CODE STATUS: Full Code  No Known Allergies  Chief Complaint  Patient presents with  . 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 He is unable to fully participate in the hpi or ros. There are no reports of changes in appetite; no reports of behavioral issues; changes in sleep pattern. He is requiring tube feeding about one time daily to supplement his po intake.   Past Medical History:  Diagnosis Date  . Arthritis   . Contracture, left hand   . 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 Depression   . Hyperlipidemia   . Hypertension   . Stroke [facility])     Past Surgical History:  Procedure Laterality Date  . HERNIA REPAIR    . PEG TUBE PLACEMENT  07/01/2016    Social History   Socioeconomic History  . Marital status: Widowed    Spouse name: Not on file  . Number of children: Not on file  . Years of education: Not on file  . Highest education level: Not on file  Social Needs  . Financial resource strain: Not on file  . Food insecurity - worry: Not on file  . Food insecurity - inability: Not on file  . Transportation needs - medical: Not on file  . Transportation needs - non-medical: Not on file  Occupational History  . Not on file  Tobacco Use  . Smoking status: Never Smoker  . Smokeless tobacco: Never Used  Substance and Sexual Activity  . Alcohol use: Not on file  . Drug use: No  . Sexual activity: Not on file  Other Topics Concern  . Not on file  Social History Narrative  . Not on file   Family History  Problem Relation Age of Onset  .  Hypertension Other       VITAL SIGNS BP 136/85   Ht 6' (1.829 m)   Wt 182 lb 4.8 oz (82.7 kg)   BMI 24.72 kg/m   Outpatient Encounter Medications as of 01/27/2017  Medication Sig  . amLODipine (NORVASC) 10 MG tablet Place 10 mg into feeding tube daily.  Marland Kitchen aspirin 325 MG tablet Place 325 mg into feeding tube daily.  Marland Kitchen atorvastatin (LIPITOR) 20 MG tablet Place 20 mg into feeding tube at bedtime.   Marland Kitchen FLUoxetine (PROZAC) 20 MG capsule Place 20 mg into feeding tube daily.  . hydrALAZINE (APRESOLINE) 25 MG tablet Place 25 mg into feeding tube 2 (two) times daily.  Marland Kitchen levETIRAcetam (KEPPRA) 100 MG/ML solution Place 5 mLs (500 mg total) into feeding tube 2 (two) times daily.  . Nutritional Supplements (FEEDING SUPPLEMENT, JEVITY 1.5 CAL/FIBER,) LIQD Place 237 mLs into feeding tube 3 (three) times daily with meals as needed (Administer 1 can if po intake of meal is <50%).  Marland Kitchen risperiDONE (RISPERDAL) 1 MG tablet Place 1 mg into feeding tube at bedtime.  . tizanidine (ZANAFLEX) 2 MG capsule Place 2 mg into feeding tube 3 (three) times daily.    No facility-administered encounter medications on file as of 01/27/2017.      SIGNIFICANT DIAGNOSTIC EXAMS  PREVIOUS:  12-01-16: chest x-ray: Mild cardiomegaly without pulmonary edema. No acute cardiopulmonary abnormality.  12-01-16: ct of head:  1. No acute intracranial abnormality. 2. Remote cortical stroke involving the left occipital lobe and left superior temporal lobe (left posterior cerebral artery distribution). 3. Remote stroke involving the pons. 4. Severe chronic microvascular ischemic changes involving the white Matter.  12-01-16: ct angio of chest:  1. No evidence of acute pulmonary embolism is seen. 2. Fusiform dilatation of the ascending thoracic aorta measuring up to 41 mm in diameter. Recommend annual imaging followup by CTA or MRA.  3. Possible small hiatal hernia.  12-02-16: EEG:  EEG Abnormalities: None Clinical  Interpretation: This normal EEG is recorded in the waking state. There was no seizure or seizure predisposition recorded on this study. Please note that a normal EEG does not preclude the possibility of epilepsy.    12-02-16: 2-d echo:  - Left ventricle: The cavity size was mildly dilated. Wall motion  was normal; there were no regional wall motion abnormalities. Mildly reduced global EF 50%   Doppler parameters are consistent with abnormal left ventricular relaxation (grade 1 diastolic dysfunction). - No significant valvular abnormality - Mild dilatation of aortic root (4.1 cm) and proximal ascending aorta (3.9 cm)   12-23-16: chest x-ray: There is no pneumonia nor other acute cardiopulmonary abnormality.  12-23-16: ct of head: 1. Multifocal infarcts. Extensive periventricular decreased attenuation, suspect small vessel vascular disease, age advanced, although a degree of superimposed demyelination cannot be excluded. Small vessel disease also noted in the basilar perforator distribution of the mid pons. No acute infarct evident. No intracranial mass, hemorrhage, or extra-axial fluid collection. 2.  Foci of arterial vascular calcification, age advanced. 3.  Presumed cerumen in each external auditory canal.  12-24-16: EEG: This awake EEG is abnormal due to diffuse slowing of the waking background.   12-25-16: MRI: of brain:  1. Motion degraded examination without evidence of acute intracranial abnormality. 2. Chronic left PCA infarct. 3. Extensive chronic small vessel ischemic changes including chronic lacunar infarcts as above. 4. Numerous chronic microhemorrhages in a pattern consistent with chronic hypertension.  NO NEW EXAMS    LABS REVIEWED; PREVIOUS  10-26-16: wbc 4.6; hgb 13.5; hct 41.7; mcv 91.8; plt 166 glucose 100; bun 17.2; creat 0.72; k+ 4.4 ;na++ 141; liver normal albumin 3.7 hgb a1c 4.5  11-05-16: glucose 85; bun 9.3; creat 0.66; k+ 4.2; na++ 138; ca 9.7 12-01-16: wbc 2.8; hgb  11.1; hct 33.6; mcv 88.2. plt 131; glucose 99; bun 17; creat 1.04; k+ 3.7; na++ 138; ca 8.9; liver normal albumin 3.1 12-03-16: wbc 2.8; hgb 12.1; hct 35.6; mcv 86.0; plt 143glucose 85; bun 7; creat 0.78; k+ 3.5; na++ 139; ca 9.2 12-23-16: wbc 5.4; hgb 12.8; hct 38.3; mcv 87.2; plt 185; glucose 105; bun 10; creat 0.75; k+ 3.3; na++ 137; ca 9.3; ast 13 alt 12; albumin 3.8 12-25-16: wbc 3.1; hgb 11.4; hct 35.6; mcv 89.7; plt 171; glucose 81; bun 14; creat 0.91; k+ 3.5; na++ 140; ca 9.0; ammonia 34  NO NEW LABS        Review of Systems  Unable to perform ROS: Other (has expressive aphasia )    Physical Exam  Constitutional: He appears well-developed and well-nourished. No distress.  Neck: Neck supple. No thyromegaly present.  Cardiovascular: Normal rate, regular rhythm and intact distal pulses.  Murmur heard. 1/6  Pulmonary/Chest: Effort normal and breath sounds normal. No respiratory distress.  Abdominal: Soft. Bowel sounds are normal. He exhibits no distension. There  is no tenderness.  Peg tube present without signs of infection present.   Musculoskeletal: He exhibits no edema.  Right hemiparesis   Lymphadenopathy:    He has no cervical adenopathy.  Neurological: He is alert.  Skin: Skin is warm and dry. He is not diaphoretic.  Psychiatric: He has a normal mood and affect.    ASSESSMENT/ PLAN:  TODAY:   1.  Hypertension: stable b/p 136/85: will continue norvasc 10 mg daily and apresoline 25 mg twice daily   2. CVA: stable; has hemiparesis on right side with spasticity present: will continue asa 81 mg daily and zanaflex 2 mg twice daily   3. Dyslipidemia: stable will continue lipitor 20 mg daily   4. Dysphagia: stable; no signs of aspiration present: uses peg tube when needed for poor po intake usually one time daily   PREVIOUS  5.  Bradycardia: stable pulse 78(apical); will avoid beta blockers and will monitor   6. Depression with history of suicidal ideation: is stable  will continue prozac 20 mg daily and risperdal 1 mg nightly  7. Seizure : no reports of further seizure activity: will continue keppra 500 mg twice daily     MD is aware of resident's narcotic use and is in agreement with current plan of care. We will attempt to wean resident as apropriate    Synthia Innocenteborah Green NP [facility]  Contact [phone] Monday through Friday 8am- 5pm  After hours call [phone]

## 2017-02-06 ENCOUNTER — Encounter (HOSPITAL_COMMUNITY): Payer: Self-pay | Admitting: Emergency Medicine

## 2017-02-06 ENCOUNTER — Inpatient Hospital Stay (HOSPITAL_COMMUNITY)
Admission: EM | Admit: 2017-02-06 | Discharge: 2017-02-10 | DRG: 070 | Disposition: A | Discharge status: Skilled Nursing Facility | Payer: Medicaid Other | Attending: Internal Medicine | Admitting: Internal Medicine

## 2017-02-06 ENCOUNTER — Emergency Department (HOSPITAL_COMMUNITY): Payer: Medicaid Other

## 2017-02-06 DIAGNOSIS — R4182 Altered mental status, unspecified: Secondary | ICD-10-CM | POA: Diagnosis present

## 2017-02-06 DIAGNOSIS — R001 Bradycardia, unspecified: Secondary | ICD-10-CM | POA: Diagnosis not present

## 2017-02-06 DIAGNOSIS — R29810 Facial weakness: Secondary | ICD-10-CM | POA: Diagnosis present

## 2017-02-06 DIAGNOSIS — R402232 Coma scale, best verbal response, inappropriate words, at arrival to emergency department: Secondary | ICD-10-CM | POA: Diagnosis present

## 2017-02-06 DIAGNOSIS — R402362 Coma scale, best motor response, obeys commands, at arrival to emergency department: Secondary | ICD-10-CM | POA: Diagnosis present

## 2017-02-06 DIAGNOSIS — E782 Mixed hyperlipidemia: Secondary | ICD-10-CM | POA: Diagnosis not present

## 2017-02-06 DIAGNOSIS — Z8249 Family history of ischemic heart disease and other diseases of the circulatory system: Secondary | ICD-10-CM

## 2017-02-06 DIAGNOSIS — E860 Dehydration: Secondary | ICD-10-CM

## 2017-02-06 DIAGNOSIS — F339 Major depressive disorder, recurrent, unspecified: Secondary | ICD-10-CM | POA: Diagnosis present

## 2017-02-06 DIAGNOSIS — G9340 Encephalopathy, unspecified: Secondary | ICD-10-CM

## 2017-02-06 DIAGNOSIS — G9341 Metabolic encephalopathy: Secondary | ICD-10-CM | POA: Diagnosis present

## 2017-02-06 DIAGNOSIS — R402122 Coma scale, eyes open, to pain, at arrival to emergency department: Secondary | ICD-10-CM | POA: Diagnosis present

## 2017-02-06 DIAGNOSIS — R059 Cough, unspecified: Secondary | ICD-10-CM | POA: Diagnosis present

## 2017-02-06 DIAGNOSIS — F0280 Dementia in other diseases classified elsewhere without behavioral disturbance: Secondary | ICD-10-CM | POA: Diagnosis present

## 2017-02-06 DIAGNOSIS — I10 Essential (primary) hypertension: Secondary | ICD-10-CM | POA: Diagnosis present

## 2017-02-06 DIAGNOSIS — R569 Unspecified convulsions: Secondary | ICD-10-CM | POA: Diagnosis not present

## 2017-02-06 DIAGNOSIS — G92 Toxic encephalopathy: Secondary | ICD-10-CM

## 2017-02-06 DIAGNOSIS — I69351 Hemiplegia and hemiparesis following cerebral infarction affecting right dominant side: Secondary | ICD-10-CM | POA: Diagnosis not present

## 2017-02-06 DIAGNOSIS — G928 Other toxic encephalopathy: Secondary | ICD-10-CM

## 2017-02-06 DIAGNOSIS — R05 Cough: Secondary | ICD-10-CM | POA: Diagnosis present

## 2017-02-06 DIAGNOSIS — Z8673 Personal history of transient ischemic attack (TIA), and cerebral infarction without residual deficits: Secondary | ICD-10-CM | POA: Diagnosis not present

## 2017-02-06 DIAGNOSIS — Z931 Gastrostomy status: Secondary | ICD-10-CM | POA: Diagnosis not present

## 2017-02-06 DIAGNOSIS — I739 Peripheral vascular disease, unspecified: Secondary | ICD-10-CM | POA: Diagnosis present

## 2017-02-06 LAB — COMPREHENSIVE METABOLIC PANEL
ALBUMIN: 4.1 g/dL (ref 3.5–5.0)
ALT: 11 U/L — ABNORMAL LOW (ref 17–63)
AST: 12 U/L — AB (ref 15–41)
Alkaline Phosphatase: 56 U/L (ref 38–126)
Anion gap: 8 (ref 5–15)
BUN: 10 mg/dL (ref 6–20)
CHLORIDE: 103 mmol/L (ref 101–111)
CO2: 27 mmol/L (ref 22–32)
Calcium: 9.6 mg/dL (ref 8.9–10.3)
Creatinine, Ser: 0.71 mg/dL (ref 0.61–1.24)
GFR calc Af Amer: >60 mL/min (ref >60)
GLUCOSE: 108 mg/dL — AB (ref 65–99)
POTASSIUM: 3.6 mmol/L (ref 3.5–5.1)
Sodium: 138 mmol/L (ref 135–145)
Total Bilirubin: 0.7 mg/dL (ref 0.3–1.2)
Total Protein: 7.7 g/dL (ref 6.5–8.1)

## 2017-02-06 LAB — HEPATIC FUNCTION PANEL
ALK PHOS: 58 (ref 25–125)
ALT: 9 — AB (ref 10–40)
AST: 9 — AB (ref 14–40)
Bilirubin, Total: 0.3

## 2017-02-06 LAB — CBC WITH DIFFERENTIAL/PLATELET
BASOS ABS: 0 10*3/uL (ref 0.0–0.1)
BASOS PCT: 0 %
EOS PCT: 3 %
Eosinophils Absolute: 0.1 10*3/uL (ref 0.0–0.7)
HCT: 40.5 % (ref 39.0–52.0)
Hemoglobin: 13.6 g/dL (ref 13.0–17.0)
Lymphocytes Relative: 21 %
Lymphs Abs: 0.9 10*3/uL (ref 0.7–4.0)
MCH: 30.2 pg (ref 26.0–34.0)
MCHC: 33.6 g/dL (ref 30.0–36.0)
MCV: 89.8 fL (ref 78.0–100.0)
MONO ABS: 0.3 10*3/uL (ref 0.1–1.0)
Monocytes Relative: 6 %
Neutro Abs: 3.1 10*3/uL (ref 1.7–7.7)
Neutrophils Relative %: 70 %
PLATELETS: 178 10*3/uL (ref 150–400)
RBC: 4.51 MIL/uL (ref 4.22–5.81)
RDW: 13.1 % (ref 11.5–15.5)
WBC: 4.4 10*3/uL (ref 4.0–10.5)

## 2017-02-06 LAB — I-STAT TROPONIN, ED: TROPONIN I, POC: 0.01 ng/mL (ref 0.00–0.08)

## 2017-02-06 LAB — URINALYSIS, ROUTINE W REFLEX MICROSCOPIC
Bilirubin Urine: NEGATIVE
Glucose, UA: NEGATIVE mg/dL
HGB URINE DIPSTICK: NEGATIVE
Ketones, ur: NEGATIVE mg/dL
Leukocytes, UA: NEGATIVE
Nitrite: NEGATIVE
Protein, ur: NEGATIVE mg/dL
SPECIFIC GRAVITY, URINE: 1.013 (ref 1.005–1.030)
pH: 6 (ref 5.0–8.0)

## 2017-02-06 LAB — I-STAT CHEM 8, ED
BUN: 8 mg/dL (ref 6–20)
CHLORIDE: 101 mmol/L (ref 101–111)
Calcium, Ion: 1.2 mmol/L (ref 1.15–1.40)
Creatinine, Ser: 0.8 mg/dL (ref 0.61–1.24)
GLUCOSE: 108 mg/dL — AB (ref 65–99)
HCT: 43 % (ref 39.0–52.0)
Hemoglobin: 14.6 g/dL (ref 13.0–17.0)
POTASSIUM: 3.6 mmol/L (ref 3.5–5.1)
Sodium: 140 mmol/L (ref 135–145)
TCO2: 28 mmol/L (ref 22–32)

## 2017-02-06 LAB — BASIC METABOLIC PANEL
BUN: 9 (ref 4–21)
Creatinine: 0.6 (ref 0.6–1.3)
Glucose: 105
Potassium: 3.8 (ref 3.4–5.3)
Sodium: 141 (ref 137–147)

## 2017-02-06 LAB — CBG MONITORING, ED: GLUCOSE-CAPILLARY: 98 mg/dL (ref 65–99)

## 2017-02-06 LAB — I-STAT CG4 LACTIC ACID, ED: LACTIC ACID, VENOUS: 0.97 mmol/L (ref 0.5–1.9)

## 2017-02-06 LAB — AMMONIA: AMMONIA: 11 umol/L (ref 9–35)

## 2017-02-06 MED ORDER — LORAZEPAM 2 MG/ML IJ SOLN: 1.0000 mg | Freq: Two times a day (BID) | INTRAMUSCULAR | Status: DC | PRN

## 2017-02-06 MED ORDER — ACETAMINOPHEN 650 MG RE SUPP: 650.0000 mg | Freq: Four times a day (QID) | RECTAL | Status: DC | PRN

## 2017-02-06 MED ORDER — HYDRALAZINE HCL 25 MG PO TABS
25.0000 mg | ORAL_TABLET | Freq: Two times a day (BID) | ORAL | Status: DC
  Administered 2017-02-06 – 2017-02-10 (×7): 25 mg
  Filled 2017-02-06 (×8): qty 1

## 2017-02-06 MED ORDER — LEVETIRACETAM 100 MG/ML PO SOLN
500.0000 mg | Freq: Two times a day (BID) | ORAL | Status: DC
  Administered 2017-02-06 – 2017-02-10 (×8): 500 mg
  Filled 2017-02-06 (×8): qty 5

## 2017-02-06 MED ORDER — RISPERIDONE 1 MG PO TABS
1.0000 mg | ORAL_TABLET | Freq: Every day | ORAL | Status: DC
  Administered 2017-02-06 – 2017-02-09 (×4): 1 mg
  Filled 2017-02-06 (×5): qty 1

## 2017-02-06 MED ORDER — FLUOXETINE HCL 20 MG PO CAPS
20.0000 mg | ORAL_CAPSULE | Freq: Every day | ORAL | Status: DC
  Administered 2017-02-07 – 2017-02-10 (×4): 20 mg
  Filled 2017-02-06 (×4): qty 1

## 2017-02-06 MED ORDER — AMLODIPINE BESYLATE 10 MG PO TABS
10.0000 mg | ORAL_TABLET | Freq: Every day | ORAL | Status: DC
  Administered 2017-02-08 – 2017-02-10 (×3): 10 mg
  Filled 2017-02-06 (×3): qty 1

## 2017-02-06 MED ORDER — ACETAMINOPHEN 325 MG PO TABS: 650.0000 mg | ORAL_TABLET | Freq: Four times a day (QID) | ORAL | Status: DC | PRN

## 2017-02-06 MED ORDER — ONDANSETRON HCL 4 MG/2ML IJ SOLN: 4.0000 mg | Freq: Four times a day (QID) | INTRAMUSCULAR | Status: DC | PRN

## 2017-02-06 MED ORDER — ASPIRIN 325 MG PO TABS
325.0000 mg | ORAL_TABLET | Freq: Every day | ORAL | Status: DC
  Administered 2017-02-07 – 2017-02-10 (×4): 325 mg
  Filled 2017-02-06 (×4): qty 1

## 2017-02-06 MED ORDER — ENOXAPARIN SODIUM 40 MG/0.4ML ~~LOC~~ SOLN
40.0000 mg | SUBCUTANEOUS | Status: DC
  Administered 2017-02-06 – 2017-02-09 (×4): 40 mg via SUBCUTANEOUS
  Filled 2017-02-06 (×4): qty 0.4

## 2017-02-06 MED ORDER — ONDANSETRON HCL 4 MG PO TABS: 4.0000 mg | ORAL_TABLET | Freq: Four times a day (QID) | ORAL | Status: DC | PRN

## 2017-02-06 MED ORDER — JEVITY 1.5 CAL/FIBER PO LIQD
237.0000 mL | Freq: Three times a day (TID) | ORAL | Status: DC | PRN
  Filled 2017-02-06 (×3): qty 1000

## 2017-02-06 MED ORDER — SODIUM CHLORIDE 0.9 % IV SOLN
1000.0000 mL | Freq: Once | INTRAVENOUS | Status: AC
  Administered 2017-02-06: 1000 mL via INTRAVENOUS

## 2017-02-06 MED ORDER — HYDRALAZINE HCL 20 MG/ML IJ SOLN: 5.0000 mg | INTRAMUSCULAR | Status: DC | PRN

## 2017-02-06 MED ORDER — GUAIFENESIN 100 MG/5ML PO SOLN: 200.0000 mg | ORAL | Status: DC | PRN

## 2017-02-06 MED ORDER — SODIUM CHLORIDE 0.9 % IV SOLN
INTRAVENOUS | Status: DC
  Administered 2017-02-06 – 2017-02-10 (×6): via INTRAVENOUS

## 2017-02-06 MED ORDER — ATORVASTATIN CALCIUM 20 MG PO TABS
20.0000 mg | ORAL_TABLET | Freq: Every day | ORAL | Status: DC
  Administered 2017-02-06 – 2017-02-09 (×4): 20 mg
  Filled 2017-02-06 (×5): qty 1

## 2017-02-06 MED ORDER — TIZANIDINE HCL 2 MG PO TABS
2.0000 mg | ORAL_TABLET | Freq: Four times a day (QID) | ORAL | Status: DC
  Administered 2017-02-06 – 2017-02-08 (×6): 2 mg
  Filled 2017-02-06 (×7): qty 1

## 2017-02-06 NOTE — ED Provider Notes (Signed)
[facility]-EMERGENCY DEPT Provider Note   CSN: [id number] Arrival date & time: 02/06/17  1457     History   Chief Complaint Chief Complaint  Patient presents with  . Altered Mental Status    HPI Jeremy Brennan is a 51 y.o. male.  Who presents the emergency department via EMS from his skilled nursing facility for altered mental status.  He has a history of previous CVA with right-sided hemiparesis and moderate dementia.  He is normally alert and verbal however his facility found him to be lethargic and altered today.  He is also found to have abnormal breath sounds and productive cough.  Patient was sent into the emergency department for evaluation.  There is a level 5 caveat due to altered mental status.  Patient is unable to provide any history.  HPI  Past Medical History:  Diagnosis Date  . Arthritis   . Contracture, left hand   . 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 Depression   . Hyperlipidemia   . Hypertension   . Stroke [facility](HCC)     Patient Active Problem List   Diagnosis Date Noted  . Acute conjunctivitis, bilateral 01/03/2017  . Seizure (HCC) 12/23/2016  . Chronic cerebrovascular accident (CVA) 12/01/2016  . Depression with suicidal ideation 12/01/2016  . Sudden cardiac arrest (HCC) 12/01/2016  . Unresponsive 12/01/2016  . Bradycardia 12/01/2016  . Hemiparesis affecting right side as late effect of stroke (HCC) 11/01/2016  . Contracture of joint of left hand 11/01/2016  . History of CVA (cerebrovascular accident) 11/01/2016  . Essential hypertension 11/01/2016  . Depression, recurrent (HCC) 11/01/2016  . Mixed hyperlipidemia 11/01/2016  . Dysphagia 11/01/2016  . S/P percutaneous endoscopic gastrostomy (PEG) tube placement (HCC) 11/01/2016  . Dysarthria 11/01/2016    Past Surgical History:  Procedure Laterality Date  . HERNIA REPAIR    . PEG TUBE PLACEMENT  07/01/2016       Home Medications    Prior to Admission medications     Medication Sig Start Date End Date Taking? Authorizing Provider  amLODipine (NORVASC) 10 MG tablet Place 10 mg into feeding tube daily.    [provider]  aspirin 325 MG tablet Place 325 mg into feeding tube daily.    [provider]  atorvastatin (LIPITOR) 20 MG tablet Place 20 mg into feeding tube at bedtime.     [provider]  FLUoxetine (PROZAC) 20 MG capsule Place 20 mg into feeding tube daily.    [provider]  hydrALAZINE (APRESOLINE) 25 MG tablet Place 25 mg into feeding tube 2 (two) times daily.    [provider]  levETIRAcetam (KEPPRA) 100 MG/ML solution Place 5 mLs (500 mg total) into feeding tube 2 (two) times daily. 12/26/16   Leatha GildingGherghe, Costin M, MD  Nutritional Supplements (FEEDING SUPPLEMENT, JEVITY 1.5 CAL/FIBER,) LIQD Place 237 mLs into feeding tube 3 (three) times daily with meals as needed (Administer 1 can if po intake of meal is <50%). 12/03/16   Arrien, York RamMauricio Daniel, MD  risperiDONE (RISPERDAL) 1 MG tablet Place 1 mg into feeding tube at bedtime.    [provider]  tizanidine (ZANAFLEX) 2 MG capsule Place 2 mg into feeding tube 3 (three) times daily.     [provider]    Family History Family History  Problem Relation Age of Onset  . Hypertension Other     Social History Social History   Tobacco Use  . Smoking status: Never Smoker  .  Smokeless tobacco: Never Used  Substance Use Topics  . Alcohol use: Not on file  . Drug use: No     Allergies   Patient has no known allergies.   Review of Systems Review of Systems  Ten systems reviewed and are negative for acute change, except as noted in the HPI.   Physical Exam Updated Vital Signs BP 105/71 (BP Location: Right Arm)   Pulse 69   Temp 98 F (36.7 C) (Oral)   Resp 18   SpO2 100%   Physical Exam  Constitutional: He appears well-developed and well-nourished. He appears lethargic. No distress.  HENT:  Head: Normocephalic and  atraumatic.  Eyes: Conjunctivae are normal. No scleral icterus.  Neck: Normal range of motion. Neck supple.  Cardiovascular: Normal rate, regular rhythm and normal heart sounds.  Pulmonary/Chest: Effort normal. No respiratory distress.  Sheet with productive cough and rhonchorous breath sounds.  His mouth and airway were suctioned he was found to have large pieces of food retained  In his mouth  Abdominal: Soft. There is no tenderness.  Musculoskeletal: He exhibits no edema.  Neurological: He appears lethargic. GCS eye subscore is 2. GCS verbal subscore is 3. GCS motor subscore is 6.  Skin: Skin is warm and dry. He is not diaphoretic.  Psychiatric: His behavior is normal.  Nursing note and vitals reviewed.    ED Treatments / Results  Labs (all labs ordered are listed, but only abnormal results are displayed) Labs Reviewed  CBC WITH DIFFERENTIAL/PLATELET  COMPREHENSIVE METABOLIC PANEL  URINALYSIS, ROUTINE W REFLEX MICROSCOPIC  AMMONIA  CBG MONITORING, ED  I-STAT CHEM 8, ED  I-STAT CG4 LACTIC ACID, ED    EKG  EKG Interpretation None       Radiology No results found.  Procedures Procedures (including critical care time)  Medications Ordered in ED Medications  0.9 %  sodium chloride infusion (not administered)     Initial Impression / Assessment and Plan / ED Course  I have reviewed the triage vital signs and the nursing notes.  Pertinent labs & imaging results that were available during my care of the patient were reviewed by me and considered in my medical decision making (see chart for details).     Patient with toxic metabolic encephalopathy.  I am unsure of the cause of his encephalopathy however it may be due to UTI.  Urine has yet to be collected and nursing staff does not have any current In-N-Out catheters both on the floors of the hospital or in the ER.  He has a condom cath in place to collect urine.  I have given report to Dr.Niu who will admit the  patient.  He is remained stable throughout his visit without any significant decline in his mental status.  Final Clinical Impressions(s) / ED Diagnoses   Final diagnoses:  Toxic metabolic encephalopathy    ED Discharge Orders    None       Arthor CaptainHarris, Neo Yepiz, PA-C 02/06/17 2359    Tegeler, Canary Brimhristopher J, MD 02/07/17 1053

## 2017-02-06 NOTE — ED Notes (Signed)
Bladder scan: 

## 2017-02-06 NOTE — H&P (Signed)
History and Physical    Jeremy Brennan ZOX:[id number] DOB: 02/07/1966 DOA: 02/06/2017  Referring MD/NP/PA:   PCP: Kirt Boysarter, Monica, DO   Patient coming from:  The patient is coming from SNF.  At baseline, pt is dependent for most of ADL.   Chief Complaint: AMS  HPI: Jeremy Brennan is a 51 y.o. 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  Patient has AMS and AMS. He is unable to provide any medical history, therefore, most of the history is obtained by discussing the case with ED physician, per EMS report, and with the nursing staff.  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 He is normally alert and verbal,  however he is found to be lethargic and altered by staff in facility today. He was noted to have cough. When I saw pt in ED, he is not oriented x 3. He has right-sided hemiparalysis and right facial droop. He does not have acute respiratory distress, not actively coughing. No active nausea, vomiting or diarrhea noted. Not sure if he has any chest pain, abdominal pain or symptoms of UTI.  ED Course: pt was found to have WBC 4.4, lactic acid 0.97, pending urinalysis, electrolytes renal function okay, heart rate 50s-90s, no tachypnea, oxygen saturation 98% on room air, temperature normal. CT head is negative for acute intracranial abnormalities. Chest x-rays negative.   Review of Systems: Could not be reviewed due to dementia and altered mental status.  Allergy: No Known Allergies  Past Medical History:  Diagnosis Date  . Arthritis   . Contracture, left hand   . 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 Depression   . Hyperlipidemia   . Hypertension   . Stroke [facility](HCC)     Past Surgical History:  Procedure Laterality Date  . HERNIA REPAIR    . PEG TUBE  PLACEMENT  07/01/2016    Social History:  reports that  has never smoked. he has never used smokeless tobacco. He reports that he does not use drugs. His alcohol history is not on file.  Family History:  Family History  Problem Relation Age of Onset  . Hypertension Other      Prior to Admission medications   Medication Sig Start Date End Date Taking? Authorizing Provider  amLODipine (NORVASC) 10 MG tablet Place 10 mg into feeding tube daily.   Yes [provider]  aspirin 325 MG tablet Place 325 mg into feeding tube daily.   Yes [provider]  atorvastatin (LIPITOR) 20 MG tablet Place 20 mg into feeding tube at bedtime.    Yes [provider]  FLUoxetine (PROZAC) 20 MG capsule Place 20 mg into feeding tube daily.   Yes [provider]  hydrALAZINE (APRESOLINE) 25 MG tablet Place 25 mg into feeding tube 2 (two) times daily.   Yes [provider]  levETIRAcetam (KEPPRA) 100 MG/ML solution Place 5 mLs (500 mg total) into feeding tube 2 (two) times daily. 12/26/16  Yes Gherghe, Daylene Katayamaostin M, MD  Nutritional Supplements (FEEDING SUPPLEMENT, JEVITY 1.5 CAL/FIBER,) LIQD Place 237 mLs into feeding tube 3 (three) times daily with meals as needed (Administer 1 can if po intake of meal is <50%). 12/03/16  Yes Arrien, York RamMauricio Daniel, MD  risperiDONE (RISPERDAL) 1 MG tablet Place 1 mg into feeding tube at bedtime.  Yes [provider]  tizanidine (ZANAFLEX) 2 MG capsule Place 2 mg into feeding tube 4 (four) times daily.    Yes [provider]    Physical Exam: Vitals:   02/06/17 1805 02/06/17 1830 02/06/17 1900 02/06/17 2000  BP: (!) 123/95 123/81 (!) 126/98 123/89  Pulse: 87 77 99 (!) 50  Resp: 11 (!) 9 (!) 9 11  Temp:      TempSrc:      SpO2: 100% 99% 98% 96%   General: Not in acute distress HEENT:       Eyes: PERRL, EOMI, no scleral icterus.       ENT: No discharge from the ears and nose, no pharynx injection, no tonsillar  enlargement.        Neck: No JVD, no bruit, no mass felt. Heme: No neck lymph node enlargement. Cardiac: S1/S2, RRR, No murmurs, No gallops or rubs. Respiratory:  No rales, wheezing, rhonchi or rubs. GI: Soft, nondistended, nontender, no organomegaly, BS present. Has PEG tube in place with clean surroundings. GU: No hematuria Ext: No pitting leg edema bilaterally. 2+DP/PT pulse bilaterally. Musculoskeletal: No joint deformities, No joint redness or warmth, no limitation of ROM in spin. Skin: No rashes.  Neuro: AMS, not oriented X3, not follow command, has right-sided hemiparesis and right facial droop. Psych: Patient is not psychotic, no suicidal or hemocidal ideation.  Labs on Admission: I have personally reviewed following labs and imaging studies  CBC: Recent Labs  Lab 02/06/17 1616 02/06/17 1629  WBC 4.4  --   NEUTROABS 3.1  --   HGB 13.6 14.6  HCT 40.5 43.0  MCV 89.8  --   PLT 178  --    Basic Metabolic Panel: Recent Labs  Lab 02/06/17 1616 02/06/17 1629  NA 138 140  K 3.6 3.6  CL 103 101  CO2 27  --   GLUCOSE 108* 108*  BUN 10 8  CREATININE 0.71 0.80  CALCIUM 9.6  --    GFR: Estimated Creatinine Clearance: 117.2 mL/min (by C-G formula based on SCr of 0.8 mg/dL). Liver Function Tests: Recent Labs  Lab 02/06/17 1616  AST 12*  ALT 11*  ALKPHOS 56  BILITOT 0.7  PROT 7.7  ALBUMIN 4.1   No results for input(s): LIPASE, AMYLASE in the last 168 hours. Recent Labs  Lab 02/06/17 1617  AMMONIA 11   Coagulation Profile: No results for input(s): INR, PROTIME in the last 168 hours. Cardiac Enzymes: No results for input(s): CKTOTAL, CKMB, CKMBINDEX, TROPONINI in the last 168 hours. BNP (last 3 results) No results for input(s): PROBNP in the last 8760 hours. HbA1C: No results for input(s): HGBA1C in the last 72 hours. CBG: Recent Labs  Lab 02/06/17 1615  GLUCAP 98   Lipid Profile: No results for input(s): CHOL, HDL, LDLCALC, TRIG, CHOLHDL, LDLDIRECT in  the last 72 hours. Thyroid Function Tests: No results for input(s): TSH, T4TOTAL, FREET4, T3FREE, THYROIDAB in the last 72 hours. Anemia Panel: No results for input(s): VITAMINB12, FOLATE, FERRITIN, TIBC, IRON, RETICCTPCT in the last 72 hours. Urine analysis:    Component Value Date/Time   COLORURINE YELLOW 12/23/2016 1624   APPEARANCEUR CLEAR 12/23/2016 1624   LABSPEC 1.013 12/23/2016 1624   PHURINE 5.0 12/23/2016 1624   GLUCOSEU NEGATIVE 12/23/2016 1624   HGBUR NEGATIVE 12/23/2016 1624   BILIRUBINUR NEGATIVE 12/23/2016 1624   KETONESUR NEGATIVE 12/23/2016 1624   PROTEINUR 30 (A) 12/23/2016 1624   NITRITE NEGATIVE 12/23/2016 1624   LEUKOCYTESUR NEGATIVE 12/23/2016 1624  Sepsis Labs: @LABRCNTIP (procalcitonin:4,lacticidven:4) )No results found for this or any previous visit (from the past 240 hour(s)).   Radiological Exams on Admission: Ct Head Wo Contrast  Result Date: 02/06/2017 CLINICAL DATA:  Unexplained altered level of consciousness. EXAM: CT HEAD WITHOUT CONTRAST TECHNIQUE: Contiguous axial images were obtained from the base of the skull through the vertex without intravenous contrast. COMPARISON:  12/23/2016 FINDINGS: Brain: No evidence of acute infarction, hemorrhage, hydrocephalus, extra-axial collection, or mass lesion/mass effect. Old left occipital lobe infarct is unchanged in appearance. Severe chronic small vessel disease is also stable. Vascular:  No hyperdense vessel or other acute findings. Skull: No evidence of fracture or other significant bone abnormality. Sinuses/Orbits:  No acute findings. Other: None. IMPRESSION: No acute intracranial abnormality. Stable left occipital lobe infarct and severe chronic small vessel disease. Electronically Signed   By: Myles Rosenthal M.D.   On: 02/06/2017 18:53   Dg Chest Portable 1 View  Result Date: 02/06/2017 CLINICAL DATA:  Altered mental status. EXAM: PORTABLE CHEST 1 VIEW COMPARISON:  12/23/2016 FINDINGS: 1545 hours. Low volume  film. The lungs are clear without focal pneumonia, edema, pneumothorax or pleural effusion. Cardiopericardial silhouette is at upper limits of normal for size. The visualized bony structures of the thorax are intact. Telemetry leads overlie the chest. IMPRESSION: No active disease. Electronically Signed   By: Kennith Center M.D.   On: 02/06/2017 15:56     EKG: Independently reviewed.  Sinus rhythm, QTC 435, low voltage, poor R-wave progression  Assessment/Plan Principal Problem:   Acute metabolic encephalopathy Active Problems:   Hemiparesis affecting right side as late effect of stroke (HCC)   History of CVA (cerebrovascular accident)   Essential hypertension   Depression, recurrent (HCC)   Mixed hyperlipidemia   Bradycardia   Seizure (HCC)   Cough  Acute metabolic encephalopathy: Etiology is not clear. No fever or leukocytosis, does not seem to have acute infection. UTI is potential differential diagnosis. Other differential diagnoses include new stroke and postictal status of seizure. Pending UA. CT-head is negative for intracranial hemorrhage.  -will admit to tele bed as inpt -f/u UA -Frequent neuro check -IV fluids: Normal saline 100 mL per hour  History of CVA: Hemiparesis affecting right side as late effect of stroke  -continue ASA and lipitor  Essential hypertension: -IV Hydralazine prn -Continue home medications: Amlodipine, hydralazine  Depression, recurrent (HCC): -Prozac  HLD: -lipitor  Bradycardia: HR 50s-90s. -tele monitoring  Seizure [facility]): -Seizure precaution -Continue Keppra -prn Ativan for seizure  Cough: Chest x-ray negative. No fever or leukocytosis. No aspiration pneumonia. -When necessary Robitussin syrup for cough   DVT ppx: SQ Lovenox Code Status: Full code Family Communication: None at bed side.     Disposition Plan:  Anticipate discharge back to previous SNF Consults called:  none Admission status:  Inpatient/tele      Date of  Service 02/06/2017    Lorretta Harp [facility] Pager [phone]  If 7PM-7AM, please contact night-coverage [email or web address] Password [facility] 02/06/2017, 8:32 PM

## 2017-02-06 NOTE — ED Triage Notes (Signed)
Per [facility] pt from [facility], staff sent pt for AMS reports pt is usually A&0 x4. Baseline of dementia and hx of stroke. Staff un orientated to date and time. Pt has no complaints.

## 2017-02-06 NOTE — ED Notes (Signed)
Suction pt mouth, phlegm and food was suctioned out of mouth.

## 2017-02-06 NOTE — ED Notes (Signed)
Bed: WA02 Expected date: 02/06/17 Expected time: 2:59 PM Means of arrival: Ambulance Comments: 51 [facility], "not acting right"

## 2017-02-07 ENCOUNTER — Other Ambulatory Visit: Payer: Self-pay

## 2017-02-07 DIAGNOSIS — G9341 Metabolic encephalopathy: Principal | ICD-10-CM

## 2017-02-07 DIAGNOSIS — F339 Major depressive disorder, recurrent, unspecified: Secondary | ICD-10-CM

## 2017-02-07 DIAGNOSIS — E782 Mixed hyperlipidemia: Secondary | ICD-10-CM

## 2017-02-07 DIAGNOSIS — R569 Unspecified convulsions: Secondary | ICD-10-CM

## 2017-02-07 DIAGNOSIS — E860 Dehydration: Secondary | ICD-10-CM

## 2017-02-07 DIAGNOSIS — Z8673 Personal history of transient ischemic attack (TIA), and cerebral infarction without residual deficits: Secondary | ICD-10-CM

## 2017-02-07 DIAGNOSIS — I10 Essential (primary) hypertension: Secondary | ICD-10-CM

## 2017-02-07 LAB — BRAIN NATRIURETIC PEPTIDE: B Natriuretic Peptide: 16.8 pg/mL (ref 0.0–100.0)

## 2017-02-07 LAB — BASIC METABOLIC PANEL
Anion gap: 5 (ref 5–15)
BUN: 9 mg/dL (ref 6–20)
CHLORIDE: 108 mmol/L (ref 101–111)
CO2: 27 mmol/L (ref 22–32)
CREATININE: 0.68 mg/dL (ref 0.61–1.24)
Calcium: 9.1 mg/dL (ref 8.9–10.3)
GFR calc Af Amer: >60 mL/min (ref >60)
GFR calc non Af Amer: >60 mL/min (ref >60)
GLUCOSE: 90 mg/dL (ref 65–99)
Potassium: 3.5 mmol/L (ref 3.5–5.1)
Sodium: 140 mmol/L (ref 135–145)

## 2017-02-07 LAB — CBC
HCT: 35.8 % — ABNORMAL LOW (ref 39.0–52.0)
Hemoglobin: 11.8 g/dL — ABNORMAL LOW (ref 13.0–17.0)
MCH: 29.6 pg (ref 26.0–34.0)
MCHC: 33 g/dL (ref 30.0–36.0)
MCV: 89.7 fL (ref 78.0–100.0)
Platelets: 168 10*3/uL (ref 150–400)
RBC: 3.99 MIL/uL — ABNORMAL LOW (ref 4.22–5.81)
RDW: 13.1 % (ref 11.5–15.5)
WBC: 3 10*3/uL — ABNORMAL LOW (ref 4.0–10.5)

## 2017-02-07 MED ORDER — CHLORHEXIDINE GLUCONATE 0.12 % MT SOLN
15.0000 mL | Freq: Two times a day (BID) | OROMUCOSAL | Status: DC
  Administered 2017-02-07 – 2017-02-10 (×5): 15 mL via OROMUCOSAL
  Filled 2017-02-07 (×6): qty 15

## 2017-02-07 MED ORDER — ORAL CARE MOUTH RINSE
15.0000 mL | Freq: Two times a day (BID) | OROMUCOSAL | Status: DC
  Administered 2017-02-07 – 2017-02-10 (×8): 15 mL via OROMUCOSAL

## 2017-02-07 MED ORDER — PRO-STAT SUGAR FREE PO LIQD
30.0000 mL | Freq: Two times a day (BID) | ORAL | Status: DC
  Administered 2017-02-07 – 2017-02-10 (×6): 30 mL
  Filled 2017-02-07 (×6): qty 30

## 2017-02-07 MED ORDER — JEVITY 1.5 CAL/FIBER PO LIQD
237.0000 mL | Freq: Three times a day (TID) | ORAL | Status: DC
  Administered 2017-02-07 – 2017-02-10 (×11): 237 mL
  Filled 2017-02-07 (×11): qty 1000

## 2017-02-07 MED ORDER — PRO-STAT SUGAR FREE PO LIQD
30.0000 mL | Freq: Two times a day (BID) | ORAL | Status: DC
  Administered 2017-02-07: 30 mL via ORAL
  Filled 2017-02-07: qty 30

## 2017-02-07 NOTE — Progress Notes (Addendum)
Initial Nutrition Assessment  DOCUMENTATION CODES:   Not applicable  INTERVENTION:   Continue Jevity 1.5 237 mL (1 can) via PEG tube TID, each can provides 355 kcals, 15.1 g of protein.    Provide 30 mL Pro-Stat BID via PEG tube, each supplement provides 15 g of protein and 100 kcals   Current tube feeding and Pro-Stat regimen provides 1265 kcal, 75 grams of protein, and 540 ml of H2O.    At baseline pt on diet, if diet remains NPO, increase can frequency. If diet advances, provide cans TID PRN, if intake is <50% at meals   NUTRITION DIAGNOSIS:   Inadequate oral intake related to dysphagia, lethargy/confusion as evidenced by estimated needs.  GOAL:   Patient will meet greater than or equal to 90% of their needs  MONITOR:   TF tolerance, Weight trends, Labs, I & O's  REASON FOR ASSESSMENT:   Malnutrition Screening Tool    ASSESSMENT:   Pt with PMH of HLD, HTN, dementia secondary to CVA, PEG placement presents from SNF with AMS   Pt did not answer RD questions at visit. Per chart, pt is dependent for most ADL at baseline, therefore unable to use muscle depletions as malnutrition criteria.  Per weight history, pt shows a downward trend in weight status. RD to provide 30 mL Pro-Stat BID via PEG to promote weight maintenance.  Pt receiving Jevity 1.5 237 mL (1 can) via PEG tube TID, each can provides 355 kcals, 15.1 g of protein. At baseline pt on diet and receives cans PRN. Pt's current diet is NPO, if continues, need to increase cans to meet estimated nutrition requirements.   Labs and medications reviewed   NUTRITION - FOCUSED PHYSICAL EXAM:    Most Recent Value  Orbital Region  No depletion  Upper Arm Region  No depletion  Thoracic and Lumbar Region  No depletion  Buccal Region  No depletion  Temple Region  No depletion  Clavicle Bone Region  Moderate depletion  Clavicle and Acromion Bone Region  Moderate depletion  Scapular Bone Region  Unable to assess   Dorsal Hand  No depletion  Patellar Region  Moderate depletion  Anterior Thigh Region  Moderate depletion  Posterior Calf Region  Mild depletion  Edema (RD Assessment)  None       Diet Order:  Diet NPO time specified  EDUCATION NEEDS:   No education needs have been identified at this time  Skin:  Skin Assessment: Reviewed RN Assessment  Last BM:  Unknown BM date  Height:   Ht Readings from Last 1 Encounters:  02/06/17 6' (1.829 m)    Weight:   Wt Readings from Last 1 Encounters:  02/06/17 173 lb 4.5 oz (78.6 kg)    Ideal Body Weight:  80.9 kg  BMI:  Body mass index is 23.5 kg/m.  Estimated Nutritional Needs:   Kcal:  2000-2200  Protein:  100-115 grams  Fluid:  >/= 2.1 L/d  Fransisca KaufmannAllison Ioannides, MS, RDN, LDN 02/07/2017 12:19 PM

## 2017-02-07 NOTE — Progress Notes (Signed)
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   Assessment & Plan:   Principal Problem:   Acute metabolic encephalopathy Active Problems:   Hemiparesis affecting right side as late effect of stroke (HCC)   History of CVA (cerebrovascular accident)   Essential hypertension   Depression, recurrent (HCC)   Mixed hyperlipidemia   Bradycardia   Seizure (HCC)   Cough  #1 acute metabolic encephalopathy Patient had presented with altered mental status.  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  Patient drowsy opens eyes to verbal stimuli.  Questionable etiology.  CT head is negative for any acute abnormalities.  Urinalysis is nitrite negative, leukocytes negative.  Blood pressure is borderline.  X-ray negative for any acute infiltrate.  Urine cultures pending.  Hydration with IV fluids.  Repeat chest x-ray in the morning after patient has been hydrated.  Follow.  2.  History of CVA with right-sided hemiparesis Patient presented with altered mental status.  Patient with a right hemiparesis.  Continue aspirin for secondary stroke prophylaxis.  Continue Lipitor.  Risk factor modification.  3.  Recurrent depression Continue Prozac.  4.  Hypertension Blood pressure borderline.  Will discontinue home regimen of amlodipine and hydralazine.  IV fluids.  Follow.  5.  Dehydration IV fluids.  6.  Hyperlipidemia Continue statin.  7.  History of seizures Continue seizure precautions.  Continue home regimen of Keppra.   Ativan as needed.  8.  Cough Chest x-ray negative.  Patient with no fever or leukocytosis.  Supportive care.   DVT prophylaxis: Lovenox Code Status: Full Family Communication: No family at bedside. Disposition Plan: Back to skilled nursing facility when back at baseline.   Consultants:   None  Procedures:   CT head without contrast 02/06/2017  Chest x-ray 02/06/2017  Antimicrobials:   None   Subjective: Patient drowsy.  Opens eyes to verbal stimuli.  Patient with right hemiparesis and prior CVA.  Patient with no oral intake today.  Objective: Vitals:   02/06/17 2030 02/06/17 2114 02/07/17 0515 02/07/17 1317  BP: (!) 131/91 126/88 108/77 (!) 98/56  Pulse: 79 89 79 77  Resp: 13 17 18 18   Temp:  97.7 F (36.5 C) 97.9 F (36.6 C) 99.1 F (37.3 C)  TempSrc:  Axillary Oral Axillary  SpO2: 93% 98% 97% 96%  Weight:  78.6 kg (173 lb 4.5 oz)    Height:  6' (1.829 m)      Intake/Output Summary (Last 24 hours) at 02/07/2017 1320 Last data filed at 02/07/2017 1320 Gross per 24 hour  Intake 721.67 ml  Output 250 ml  Net 471.67 ml   Filed Weights   02/06/17 2114  Weight: 78.6 kg (173 lb 4.5 oz)    Examination:  General exam: Drowsy.  Opens eyes to verbal stimuli. Respiratory system: Clear to auscultation anterior lung fields no wheezing, no crackles, no rhonchi.Marland Kitchen Respiratory effort normal. Cardiovascular system: S1 & S2 heard, RRR. No JVD, murmurs, rubs, gallops or clicks. No pedal edema. Gastrointestinal system: Abdomen is nondistended, soft and nontender. No organomegaly or  masses felt. Normal bowel sounds heard.  PEG tube site clean/dry/intact. Central nervous system: Drowsy.  Right hemiparesis, right facial droop.  Extremities: Right hemiparesis Skin: No rashes, lesions or ulcers Psychiatry: Judgement and insight appear poor. Mood & affect appropriate.     Data Reviewed: I have personally reviewed following labs and imaging studies  CBC: Recent Labs  Lab  02/06/17 1616 02/06/17 1629 02/07/17 0412  WBC 4.4  --  3.0*  NEUTROABS 3.1  --   --   HGB 13.6 14.6 11.8*  HCT 40.5 43.0 35.8*  MCV 89.8  --  89.7  PLT 178  --  168   Basic Metabolic Panel: Recent Labs  Lab 02/06/17 1616 02/06/17 1629 02/07/17 0412  NA 138 140 140  K 3.6 3.6 3.5  CL 103 101 108  CO2 27  --  27  GLUCOSE 108* 108* 90  BUN 10 8 9   CREATININE 0.71 0.80 0.68  CALCIUM 9.6  --  9.1   GFR: Estimated Creatinine Clearance: 117.2 mL/min (by C-G formula based on SCr of 0.68 mg/dL). Liver Function Tests: Recent Labs  Lab 02/06/17 1616  AST 12*  ALT 11*  ALKPHOS 56  BILITOT 0.7  PROT 7.7  ALBUMIN 4.1   No results for input(s): LIPASE, AMYLASE in the last 168 hours. Recent Labs  Lab 02/06/17 1617  AMMONIA 11   Coagulation Profile: No results for input(s): INR, PROTIME in the last 168 hours. Cardiac Enzymes: No results for input(s): CKTOTAL, CKMB, CKMBINDEX, TROPONINI in the last 168 hours. BNP (last 3 results) No results for input(s): PROBNP in the last 8760 hours. HbA1C: No results for input(s): HGBA1C in the last 72 hours. CBG: Recent Labs  Lab 02/06/17 1615  GLUCAP 98   Lipid Profile: No results for input(s): CHOL, HDL, LDLCALC, TRIG, CHOLHDL, LDLDIRECT in the last 72 hours. Thyroid Function Tests: No results for input(s): TSH, T4TOTAL, FREET4, T3FREE, THYROIDAB in the last 72 hours. Anemia Panel: No results for input(s): VITAMINB12, FOLATE, FERRITIN, TIBC, IRON, RETICCTPCT in the last 72 hours. Sepsis Labs: Recent Labs  Lab 02/06/17 1629  LATICACIDVEN 0.97    No results found for this or any previous visit (from the past 240 hour(s)).       Radiology Studies: Ct Head Wo Contrast  Result Date: 02/06/2017 CLINICAL DATA:  Unexplained altered level of consciousness. EXAM: CT HEAD WITHOUT CONTRAST TECHNIQUE: Contiguous axial images were obtained from the base of the skull through the vertex without intravenous contrast. COMPARISON:   12/23/2016 FINDINGS: Brain: No evidence of acute infarction, hemorrhage, hydrocephalus, extra-axial collection, or mass lesion/mass effect. Old left occipital lobe infarct is unchanged in appearance. Severe chronic small vessel disease is also stable. Vascular:  No hyperdense vessel or other acute findings. Skull: No evidence of fracture or other significant bone abnormality. Sinuses/Orbits:  No acute findings. Other: None. IMPRESSION: No acute intracranial abnormality. Stable left occipital lobe infarct and severe chronic small vessel disease. Electronically Signed   By: Myles RosenthalJohn  Stahl M.D.   On: 02/06/2017 18:53   Dg Chest Portable 1 View  Result Date: 02/06/2017 CLINICAL DATA:  Altered mental status. EXAM: PORTABLE CHEST 1 VIEW COMPARISON:  12/23/2016 FINDINGS: 1545 hours. Low volume film. The lungs are clear without focal pneumonia, edema, pneumothorax or pleural effusion. Cardiopericardial silhouette is at upper limits of normal for size. The visualized bony structures of the thorax are intact. Telemetry leads overlie the chest. IMPRESSION: No active disease. Electronically Signed   By: Jamison OkaEric  Mansell M.D.  On: 02/06/2017 15:56        Scheduled Meds: . amLODipine  10 mg Per Tube Daily  . aspirin  325 mg Per Tube Daily  . atorvastatin  20 mg Per Tube QHS  . chlorhexidine  15 mL Mouth Rinse BID  . enoxaparin (LOVENOX) injection  40 mg Subcutaneous Q24H  . feeding supplement (JEVITY 1.5 CAL/FIBER)  237 mL Per Tube TID  . feeding supplement (PRO-STAT SUGAR FREE 64)  30 mL Oral BID  . FLUoxetine  20 mg Per Tube Daily  . hydrALAZINE  25 mg Per Tube BID  . levETIRAcetam  500 mg Per Tube BID  . mouth rinse  15 mL Mouth Rinse q12n4p  . risperiDONE  1 mg Per Tube QHS  . tiZANidine  2 mg Per Tube QID   Continuous Infusions: . sodium chloride 100 mL/hr at 02/06/17 2150     LOS: 1 day    Time spent: 40 minutes    Ramiro Harvestaniel Thompson, MD [facility] Pager [phone]  If 7PM-7AM,  please contact night-coverage [email or web address] Password [email or web address] 02/07/2017, 1:20 PM

## 2017-02-08 ENCOUNTER — Inpatient Hospital Stay (HOSPITAL_COMMUNITY): Payer: Medicaid Other

## 2017-02-08 ENCOUNTER — Inpatient Hospital Stay (HOSPITAL_COMMUNITY)
Admit: 2017-02-08 | Discharge: 2017-02-08 | Disposition: A | Discharge status: Home / Self Care | Payer: Medicaid Other | Attending: Internal Medicine | Admitting: Internal Medicine

## 2017-02-08 DIAGNOSIS — R4182 Altered mental status, unspecified: Secondary | ICD-10-CM

## 2017-02-08 LAB — CBC WITH DIFFERENTIAL/PLATELET
BASOS PCT: 1 %
Basophils Absolute: 0 10*3/uL (ref 0.0–0.1)
EOS ABS: 0.1 10*3/uL (ref 0.0–0.7)
EOS PCT: 3 %
HCT: 35.2 % — ABNORMAL LOW (ref 39.0–52.0)
HEMOGLOBIN: 11.5 g/dL — AB (ref 13.0–17.0)
Lymphocytes Relative: 32 %
Lymphs Abs: 1.4 10*3/uL (ref 0.7–4.0)
MCH: 29.6 pg (ref 26.0–34.0)
MCHC: 32.7 g/dL (ref 30.0–36.0)
MCV: 90.5 fL (ref 78.0–100.0)
MONOS PCT: 7 %
Monocytes Absolute: 0.3 10*3/uL (ref 0.1–1.0)
NEUTROS PCT: 57 %
Neutro Abs: 2.5 10*3/uL (ref 1.7–7.7)
PLATELETS: 145 10*3/uL — AB (ref 150–400)
RBC: 3.89 MIL/uL — ABNORMAL LOW (ref 4.22–5.81)
RDW: 13.2 % (ref 11.5–15.5)
WBC: 4.3 10*3/uL (ref 4.0–10.5)

## 2017-02-08 LAB — URINE CULTURE

## 2017-02-08 LAB — BASIC METABOLIC PANEL
Anion gap: 5 (ref 5–15)
BUN: 20 mg/dL (ref 6–20)
CALCIUM: 9 mg/dL (ref 8.9–10.3)
CHLORIDE: 109 mmol/L (ref 101–111)
CO2: 26 mmol/L (ref 22–32)
CREATININE: 0.8 mg/dL (ref 0.61–1.24)
Glucose, Bld: 83 mg/dL (ref 65–99)
Potassium: 3.7 mmol/L (ref 3.5–5.1)
SODIUM: 140 mmol/L (ref 135–145)

## 2017-02-08 LAB — MRSA PCR SCREENING: MRSA BY PCR: NEGATIVE

## 2017-02-08 LAB — GLUCOSE, CAPILLARY
GLUCOSE-CAPILLARY: 64 mg/dL — AB (ref 65–99)
GLUCOSE-CAPILLARY: 100 mg/dL — AB (ref 65–99)

## 2017-02-08 LAB — MAGNESIUM: MAGNESIUM: 1.7 mg/dL (ref 1.7–2.4)

## 2017-02-08 MED ORDER — TIZANIDINE HCL 4 MG PO TABS
2.0000 mg | ORAL_TABLET | Freq: Two times a day (BID) | ORAL | Status: DC
  Administered 2017-02-08 – 2017-02-10 (×4): 2 mg
  Filled 2017-02-08 (×5): qty 1

## 2017-02-08 MED ORDER — MAGNESIUM SULFATE 4 GM/100ML IV SOLN
4.0000 g | Freq: Once | INTRAVENOUS | Status: AC
  Administered 2017-02-08: 4 g via INTRAVENOUS
  Filled 2017-02-08: qty 100

## 2017-02-08 NOTE — Progress Notes (Signed)
EEG complete - results pending 

## 2017-02-08 NOTE — Progress Notes (Signed)
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   Assessment & Plan:   Principal Problem:   Acute metabolic encephalopathy Active Problems:   Hemiparesis affecting right side as late effect of stroke (HCC)   History of CVA (cerebrovascular accident)   Essential hypertension   Depression, recurrent (HCC)   Mixed hyperlipidemia   Bradycardia   Seizure (HCC)   Cough   Dehydration  #1 acute metabolic encephalopathy Patient had presented with altered mental status. ??  Etiology.  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  Patient drowsy opens eyes to verbal stimuli.   CT head is negative for any acute abnormalities.  Urinalysis is nitrite negative, leukocytes negative.  Blood pressure improving with holding oral antihypertensive agents.  X-ray negative for any acute infiltrate.  Urine cultures insignificant growth.  Continue hydration with IV fluids.  Repeat chest x-ray unremarkable.  Continue IV fluids, supportive care.   2.  History of CVA with right-sided hemiparesis Patient presented with altered mental status.  Patient with a right hemiparesis.  Continue aspirin for secondary stroke prophylaxis.  Continue Lipitor.  Risk factor modification.  3.  Recurrent depression Continue Prozac.  4.  Hypertension Blood pressure was borderline, however improving slowly with holding oral antihypertensive medications and continued hydration.  Follow.  5.  Dehydration IV fluids.  6.  Hyperlipidemia Continue  statin.  7.  History of seizures Continue seizure precautions.  Continue home regimen of Keppra.  Ativan as needed.  8.  Cough Chest x-ray negative.  Patient with no fever or leukocytosis.  Supportive care.   DVT prophylaxis: Lovenox Code Status: Full Family Communication: No family at bedside. Disposition Plan: Back to skilled nursing facility when back at baseline.   Consultants:   None  Procedures:   CT head without contrast 02/06/2017  Chest x-ray 02/06/2017  Antimicrobials:   None   Subjective: Patient drowsy.  Right hemiparesis.  Poor oral intake.  Minimally verbal.   Objective: Vitals:   02/07/17 2039 02/08/17 0442 02/08/17 0602 02/08/17 1357  BP: 120/87 (!) 133/92  130/85  Pulse: 76 75  70  Resp: 18 16  16   Temp: 98.2 F (36.8 C) 99.5 F (37.5 C) 98.3 F (36.8 C) 99 F (37.2 C)  TempSrc: Oral Oral Oral Oral  SpO2: 97% 99%  99%  Weight:      Height:        Intake/Output Summary (Last 24 hours) at 02/08/2017 1745 Last data filed at 02/08/2017 1614 Gross per 24 hour  Intake 4470.67 ml  Output 925 ml  Net 3545.67 ml   Filed Weights   02/06/17 2114  Weight: 78.6 kg (173 lb 4.5 oz)    Examination:  General exam: Drowsy. Respiratory system: Clear to auscultation anterior lung fields no wheezing, no crackles, no rhonchi.Marland Kitchen. Respiratory effort normal. Cardiovascular system: Regular rate rhythm no murmurs rubs or gallops.  No JVD.  Gastrointestinal system: Abdomen is nondistended, soft and nontender. No organomegaly or masses felt. Normal bowel sounds heard.  PEG tube site clean/dry/intact. Central nervous system: Drowsy.  Right hemiparesis, right facial droop.  Extremities: Right hemiparesis Skin: No rashes, lesions or ulcers Psychiatry: Judgement and insight appear poor. Mood & affect appropriate.     Data Reviewed: I have personally reviewed following labs and imaging studies  CBC: Recent Labs  Lab 02/06/17 1616 02/06/17 1629 02/07/17 0412  02/08/17 0516  WBC 4.4  --  3.0* 4.3  NEUTROABS 3.1  --   --  2.5  HGB 13.6 14.6 11.8* 11.5*  HCT 40.5 43.0 35.8* 35.2*  MCV 89.8  --  89.7 90.5  PLT 178  --  168 145*   Basic Metabolic Panel: Recent Labs  Lab 02/06/17 02/06/17 1616 02/06/17 1629 02/07/17 0412 02/08/17 0516  NA 141 138 140 140 140  K 3.8 3.6 3.6 3.5 3.7  CL  --  103 101 108 109  CO2  --  27  --  27 26  GLUCOSE  --  108* 108* 90 83  BUN 9 10 8 9 20   CREATININE 0.6 0.71 0.80 0.68 0.80  CALCIUM  --  9.6  --  9.1 9.0  MG  --   --   --   --  1.7   GFR: Estimated Creatinine Clearance: 117.2 mL/min (by C-G formula based on SCr of 0.8 mg/dL). Liver Function Tests: Recent Labs  Lab 02/06/17 02/06/17 1616  AST 9* 12*  ALT 9* 11*  ALKPHOS 58 56  BILITOT  --  0.7  PROT  --  7.7  ALBUMIN  --  4.1   No results for input(s): LIPASE, AMYLASE in the last 168 hours. Recent Labs  Lab 02/06/17 1617  AMMONIA 11   Coagulation Profile: No results for input(s): INR, PROTIME in the last 168 hours. Cardiac Enzymes: No results for input(s): CKTOTAL, CKMB, CKMBINDEX, TROPONINI in the last 168 hours. BNP (last 3 results) No results for input(s): PROBNP in the last 8760 hours. HbA1C: No results for input(s): HGBA1C in the last 72 hours. CBG: Recent Labs  Lab 02/06/17 1615 02/08/17 0849  GLUCAP 98 100*   Lipid Profile: No results for input(s): CHOL, HDL, LDLCALC, TRIG, CHOLHDL, LDLDIRECT in the last 72 hours. Thyroid Function Tests: No results for input(s): TSH, T4TOTAL, FREET4, T3FREE, THYROIDAB in the last 72 hours. Anemia Panel: No results for input(s): VITAMINB12, FOLATE, FERRITIN, TIBC, IRON, RETICCTPCT in the last 72 hours. Sepsis Labs: Recent Labs  Lab 02/06/17 1629  LATICACIDVEN 0.97    Recent Results (from the past 240 hour(s))  Urine Culture     Status: Abnormal   Collection Time: 02/06/17  2:57 PM  Result Value Ref Range Status   Specimen Description URINE, RANDOM  Final   Special Requests  NONE  Final   Culture (A)  Final    <10,000 COLONIES/mL INSIGNIFICANT GROWTH Performed at [facility], [address], [place], [place] [address]    Report Status 02/08/2017 FINAL  Final  MRSA PCR Screening     Status: None   Collection Time: 02/07/17  9:06 PM  Result Value Ref Range Status   MRSA by PCR NEGATIVE NEGATIVE Final    Comment:        The GeneXpert MRSA Assay (FDA approved for NASAL specimens only), is one component of a comprehensive MRSA colonization surveillance program. It is not intended to diagnose MRSA infection nor to guide or monitor treatment for MRSA infections.          Radiology Studies: Ct Head Wo Contrast  Result Date: 02/06/2017 CLINICAL DATA:  Unexplained  altered level of consciousness. EXAM: CT HEAD WITHOUT CONTRAST TECHNIQUE: Contiguous axial images were obtained from the base of the skull through the vertex without intravenous contrast. COMPARISON:  12/23/2016 FINDINGS: Brain: No evidence of acute infarction, hemorrhage, hydrocephalus, extra-axial collection, or mass lesion/mass effect. Old left occipital lobe infarct is unchanged in appearance. Severe chronic small vessel disease is also stable. Vascular:  No hyperdense vessel or other acute findings. Skull: No evidence of fracture or other significant bone abnormality. Sinuses/Orbits:  No acute findings. Other: None. IMPRESSION: No acute intracranial abnormality. Stable left occipital lobe infarct and severe chronic small vessel disease. Electronically Signed   By: Myles RosenthalJohn  Stahl M.D.   On: 02/06/2017 18:53   Dg Chest Port 1 View  Result Date: 02/08/2017 CLINICAL DATA:  Acute encephalopathy. EXAM: PORTABLE CHEST 1 VIEW COMPARISON:  02/06/2017 FINDINGS: Both lungs are clear. Heart and mediastinum are within normal limits. Trachea is midline. Bone structures are intact. Negative for a pneumothorax. Degenerative changes with spurring along the inferior left glenohumeral joint. IMPRESSION: No active  disease. Electronically Signed   By: Richarda OverlieAdam  Henn M.D.   On: 02/08/2017 07:27        Scheduled Meds: . amLODipine  10 mg Per Tube Daily  . aspirin  325 mg Per Tube Daily  . atorvastatin  20 mg Per Tube QHS  . chlorhexidine  15 mL Mouth Rinse BID  . enoxaparin (LOVENOX) injection  40 mg Subcutaneous Q24H  . feeding supplement (JEVITY 1.5 CAL/FIBER)  237 mL Per Tube TID  . feeding supplement (PRO-STAT SUGAR FREE 64)  30 mL Per Tube BID  . FLUoxetine  20 mg Per Tube Daily  . hydrALAZINE  25 mg Per Tube BID  . levETIRAcetam  500 mg Per Tube BID  . mouth rinse  15 mL Mouth Rinse q12n4p  . risperiDONE  1 mg Per Tube QHS  . tiZANidine  2 mg Per Tube BID   Continuous Infusions: . sodium chloride 100 mL/hr at 02/08/17 1657     LOS: 2 days    Time spent: 40 minutes    Ramiro Harvestaniel Carlyann Placide, MD [facility] Pager [phone]  If 7PM-7AM, please contact night-coverage [email or web address] Password [email or web address] 02/08/2017, 5:45 PM

## 2017-02-08 NOTE — Progress Notes (Signed)
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  Medications: acetaminophen (TYLENOL) 650 mg  amLODipine (NORVASC) tablet 10 mg  aspirin tablet 325 mg  atorvastatin (LIPITOR) tablet 20 mg  FLUoxetine (PROZAC) capsule 20 mg  guaiFENesin (ROBITUSSIN) 100 MG/5ML solution 200 mg  hydrALAZINE (APRESOLINE) tablet 25 mg  levETIRAcetam (KEPPRA) 100 MG/ML solution 500 mg  LORazepam (ATIVAN) injection 1 mg  ondansetron (ZOFRAN) tablet 4 mg  risperiDONE (RISPERDAL) tablet 1 mg  tiZANidine (ZANAFLEX) tablet 2 mg   Technical Summary: A multichannel digital EEG recording measured by the international 10-20 system with electrodes applied with paste and impedances below 5000 ohms performed as portable with EKG monitoring in an awake and drowsy patient.  Hyperventilation and photic stimulation were not performed.  The digital EEG was referentially recorded, reformatted, and digitally filtered in a variety of bipolar and referential montages for optimal display.   Description: The patient is awake and drowsy during the recording.  During maximal wakefulness, there is a symmetric, medium voltage 6 Hz posterior dominant rhythm that attenuates with eye opening. This is admixed with diffuse 4-5 Hz theta and 2-3 Hz delta slowing of the waking background.  Stage 2 sleep is not seen.  There were no epileptiform discharges or electrographic seizures seen.    EKG lead was unremarkable.  Impression: This awake and drowsy EEG is abnormal due to diffuse slowing of the waking background.  Clinical Correlation of the above findings  indicates diffuse cerebral dysfunction that is non-specific in etiology and can be seen with hypoxic/ischemic injury, toxic/metabolic encephalopathies, neurodegenerative disorders, or medication effect.  The absence of epileptiform discharges does not rule out a clinical diagnosis of epilepsy.  Clinical correlation is advised.   Shon MilletAdam Jaffe, DO

## 2017-02-08 NOTE — Evaluation (Signed)
Clinical/Bedside Swallow Evaluation Patient Details  Name: Jeremy Brennan MRN: [id number] Date of Birth: 09/30/1965  Today's Date: 02/08/2017 Time: SLP Start Time (ACUTE ONLY): 1145 SLP Stop Time (ACUTE ONLY): 1230 SLP Time Calculation (min) (ACUTE ONLY): 45 min  Past Medical History:  Past Medical History:  Diagnosis Date  . Arthritis   . Contracture, left hand   . 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 Depression   . Hyperlipidemia   . Hypertension   . Stroke [facility](HCC)    Past Surgical History:  Past Surgical History:  Procedure Laterality Date  . HERNIA REPAIR    . PEG TUBE PLACEMENT  07/01/2016   HPI:  51 yo male with h/o cva with right hemiparesis, dysphagia - has PEG - CVA occured August 2018- adm with [facility].    Pt with recent hospital admission with seizures and had cardiac arrest requriring CPR for 3 minutes.  Asked nurse secretary to order soft call bell for 1412    Assessment / Plan / Recommendation Clinical Impression  Uncertain if pt's swallow ability is at baseline but suspect possible worsening given acute encephalopathy.  Decreased labial seal with lingual thrusting and delayed oral transiting noted.  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   Multiple subswallows that were audible apparent.  Question if pt has some UES dysfunction given audible swallow and his report of some backflow.    Overt cough with swallow attempt of solids concerning for decreased airway protection with solids.     Pt is a laborious feeder and had PEG for nutrition, recommend  modify diet to maxmize safety with po to full liquids with strict precautions.  Informed RN of recommendations and asked nurse secretary to order soft call bell for pt.  will follow up briefly to assure tolerance.  SLP Visit Diagnosis: Dysphagia, oropharyngeal phase (R13.12)    Aspiration Risk  Moderate aspiration risk    Diet Recommendation Thin liquid;Nectar-thick liquid(full liquids)    Liquid Administration via: Straw Medication Administration: Crushed with puree(whole if small, oral suction after po) Supervision: Full supervision/cueing for compensatory strategies Compensations: Slow rate;Small sips/bites;Other (Comment)(allow time for extra swallows)    Other  Recommendations Oral Care Recommendations: Oral care QID   Follow up Recommendations        Frequency and Duration min 1 x/week  1 week       Prognosis Prognosis for Safe Diet Advancement: Good      Swallow Study   General Date of Onset: 02/08/17 HPI: 51 yo male with h/o cva with right hemiparesis, dysphagia - has PEG - CVA occured August 2018- adm with [facility].    Pt with recent hospital admission with seizures and had cardiac arrest requriring CPR for 3 minutes.  Asked nurse secretary to order soft call bell for 1412  Type of Study: Bedside Swallow Evaluation Diet Prior to this Study: Dysphagia 2 (chopped);Thin liquids Temperature Spikes Noted: Yes Respiratory Status: Room air History of Recent Intubation: No Behavior/Cognition: Alert;Cooperative;Pleasant mood Oral Cavity Assessment: Excessive secretions Oral Care Completed by SLP: Yes Oral Cavity - Dentition: Missing dentition;Poor condition Vision: Impaired for self-feeding Self-Feeding Abilities: Total assist Patient Positioning: Upright in bed Baseline Vocal Quality: Low vocal intensity Volitional Cough: Weak Volitional Swallow: Unable to elicit    Oral/Motor/Sensory Function Overall Oral Motor/Sensory Function: Moderate impairment(h/o right sided paralysis and left arm edema) Facial ROM: Reduced right Facial Symmetry: Abnormal symmetry right Facial Strength: Reduced right Facial Sensation: Reduced right Lingual ROM:  Reduced right Lingual Symmetry: Abnormal symmetry right Lingual Strength: Reduced Lingual Sensation: Reduced Velum: Other (comment)(pt did not follow directions) Mandible: Other (Comment)(pt did not follow directions)    Ice Chips Ice chips: Not tested   Thin Liquid Thin Liquid: Impaired Presentation: Straw Oral Phase Impairments: Reduced labial seal;Reduced lingual movement/coordination Oral Phase Functional Implications: Prolonged oral transit Pharyngeal  Phase Impairments: Decreased hyoid-laryngeal movement;Multiple swallows;Suspected delayed Swallow    Nectar Thick Nectar Thick Liquid: Impaired Oral Phase Impairments: Reduced labial seal;Reduced lingual movement/coordination Oral phase functional implications: Prolonged oral transit Pharyngeal Phase Impairments: Multiple swallows;Decreased hyoid-laryngeal movement;Suspected delayed Swallow   Honey Thick Honey Thick Liquid: Not tested   Puree Puree: Impaired Presentation: Spoon Oral Phase Impairments: Reduced labial seal;Reduced lingual movement/coordination Oral Phase Functional Implications: Prolonged oral transit Pharyngeal Phase Impairments: Multiple swallows;Decreased hyoid-laryngeal movement;Suspected delayed Swallow   Solid   GO   Solid: Impaired Oral Phase Impairments: Impaired mastication;Reduced lingual movement/coordination;Reduced labial seal Oral Phase Functional Implications: Impaired mastication Pharyngeal Phase Impairments: Suspected delayed Swallow;Multiple swallows;Cough - Delayed Other Comments: delayed weak cough, concern for increased residuals with solids        Chales AbrahamsKimball, Willistine Ferrall Ann 02/08/2017,12:53 PM  Donavan Burnetamara Bahar Shelden, MS [facility] SLP [phone]

## 2017-02-09 LAB — CBC
HCT: 31.8 % — ABNORMAL LOW (ref 39.0–52.0)
Hemoglobin: 10.5 g/dL — ABNORMAL LOW (ref 13.0–17.0)
MCH: 29.7 pg (ref 26.0–34.0)
MCHC: 33 g/dL (ref 30.0–36.0)
MCV: 90.1 fL (ref 78.0–100.0)
Platelets: 154 10*3/uL (ref 150–400)
RBC: 3.53 MIL/uL — ABNORMAL LOW (ref 4.22–5.81)
RDW: 13.1 % (ref 11.5–15.5)
WBC: 3.5 10*3/uL — ABNORMAL LOW (ref 4.0–10.5)

## 2017-02-09 LAB — BASIC METABOLIC PANEL
Anion gap: 5 (ref 5–15)
BUN: 19 mg/dL (ref 6–20)
CHLORIDE: 108 mmol/L (ref 101–111)
CO2: 26 mmol/L (ref 22–32)
Calcium: 8.3 mg/dL — ABNORMAL LOW (ref 8.9–10.3)
Creatinine, Ser: 0.72 mg/dL (ref 0.61–1.24)
GFR calc Af Amer: >60 mL/min (ref >60)
GFR calc non Af Amer: >60 mL/min (ref >60)
GLUCOSE: 86 mg/dL (ref 65–99)
POTASSIUM: 3.7 mmol/L (ref 3.5–5.1)
Sodium: 139 mmol/L (ref 135–145)

## 2017-02-09 LAB — GLUCOSE, CAPILLARY
Glucose-Capillary: 71 mg/dL (ref 65–99)
Glucose-Capillary: 105 mg/dL — ABNORMAL HIGH (ref 65–99)

## 2017-02-09 LAB — MAGNESIUM: MAGNESIUM: 1.9 mg/dL (ref 1.7–2.4)

## 2017-02-09 NOTE — Progress Notes (Signed)
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   Assessment & Plan:   Principal Problem:   Acute metabolic encephalopathy Active Problems:   Hemiparesis affecting right side as late effect of stroke (HCC)   History of CVA (cerebrovascular accident)   Essential hypertension   Depression, recurrent (HCC)   Mixed hyperlipidemia   Bradycardia   Seizure (HCC)   Cough   Dehydration  #1 acute metabolic encephalopathy Patient had presented with altered mental status. ??  Etiology.  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  Patient more alert today and less drowsy.  Patient following some commands and answering some questions appropriately.  CT head is negative for any acute abnormalities.  Urinalysis is nitrite negative, leukocytes negative.  Blood pressure improving with holding oral antihypertensive agents.  X-ray negative for any acute infiltrate.  Urine cultures insignificant growth.  Zanaflex dose was decreased to twice daily with continued clinical improvement.  Continue hydration with IV fluids.  Repeat chest x-ray unremarkable.  Continue supportive care.   2.  History of CVA with right-sided hemiparesis Patient presented with altered mental status.  Patient with a right hemiparesis.  Continue aspirin for secondary stroke prophylaxis.  Continue Lipitor.  Risk factor modification.  PT/OT.  3.  Recurrent depression Continue Prozac.  4.  Hypertension Blood pressure was borderline, however improving slowly  with holding oral antihypertensive medications and continued hydration.  Follow.  5.  Dehydration IV fluids.  6.  Hyperlipidemia Continue statin.  7.  History of seizures Continue seizure precautions.  Continue home regimen of Keppra.  Ativan as needed.  8.  Cough Chest x-ray negative.  Patient with no fever or leukocytosis.  Supportive care.   DVT prophylaxis: Lovenox Code Status: Full Family Communication: No family at bedside. Disposition Plan: Back to skilled nursing facility when back at baseline hopefully in the next 24-48 hours.   Consultants:   None  Procedures:   CT head without contrast 02/06/2017  Chest x-ray 02/06/2017  Antimicrobials:   None   Subjective: Patient more alert today.  Patient answering some questions appropriately.  Oral intake slowly improving however still minimal.  Right hemiparesis.   Objective: Vitals:   02/08/17 0602 02/08/17 1357 02/08/17 2110 02/09/17 0436  BP:  130/85 137/79 (!) 154/77  Pulse:  70 81 81  Resp:  16 18 19   Temp: 98.3 F (36.8 C) 99 F (37.2 C) 97.9 F (36.6 C) 98.7 F (37.1 C)  TempSrc: Oral Oral Oral Oral  SpO2:  99% 96% 98%  Weight:      Height:        Intake/Output Summary (Last 24 hours) at 02/09/2017 1048 Last data filed at 02/09/2017 0915 Gross per 24 hour  Intake 3701.01 ml  Output 1175 ml  Net 2526.01 ml   Filed Weights   02/06/17 2114  Weight: 78.6 kg (173 lb 4.5 oz)    Examination:  General exam: More alert. Respiratory system: Clear to auscultation anterior lung fields no wheezing, no crackles, no rhonchi.Marland Kitchen Respiratory effort normal. Cardiovascular system: Regular rate  rhythm no murmurs rubs or gallops.  No JVD.  Gastrointestinal system: Abdomen is soft, nontender, nondistended, positive bowel sounds.  PEG tube site intact.  No drainage noted.  Central nervous system: More alert today.  Right hemiparesis.  Slight right facial droop.  Extremities: Right hemiparesis.  Skin: No rashes,  lesions or ulcers Psychiatry: Judgement and insight appear fair. Mood & affect appropriate.     Data Reviewed: I have personally reviewed following labs and imaging studies  CBC: Recent Labs  Lab 02/06/17 1616 02/06/17 1629 02/07/17 0412 02/08/17 0516 02/09/17 0423  WBC 4.4  --  3.0* 4.3 3.5*  NEUTROABS 3.1  --   --  2.5  --   HGB 13.6 14.6 11.8* 11.5* 10.5*  HCT 40.5 43.0 35.8* 35.2* 31.8*  MCV 89.8  --  89.7 90.5 90.1  PLT 178  --  168 145* 154   Basic Metabolic Panel: Recent Labs  Lab 02/06/17 1616 02/06/17 1629 02/07/17 0412 02/08/17 0516 02/09/17 0423  NA 138 140 140 140 139  K 3.6 3.6 3.5 3.7 3.7  CL 103 101 108 109 108  CO2 27  --  27 26 26   GLUCOSE 108* 108* 90 83 86  BUN 10 8 9 20 19   CREATININE 0.71 0.80 0.68 0.80 0.72  CALCIUM 9.6  --  9.1 9.0 8.3*  MG  --   --   --  1.7 1.9   GFR: Estimated Creatinine Clearance: 117.2 mL/min (by C-G formula based on SCr of 0.72 mg/dL). Liver Function Tests: Recent Labs  Lab 02/06/17 02/06/17 1616  AST 9* 12*  ALT 9* 11*  ALKPHOS 58 56  BILITOT  --  0.7  PROT  --  7.7  ALBUMIN  --  4.1   No results for input(s): LIPASE, AMYLASE in the last 168 hours. Recent Labs  Lab 02/06/17 1617  AMMONIA 11   Coagulation Profile: No results for input(s): INR, PROTIME in the last 168 hours. Cardiac Enzymes: No results for input(s): CKTOTAL, CKMB, CKMBINDEX, TROPONINI in the last 168 hours. BNP (last 3 results) No results for input(s): PROBNP in the last 8760 hours. HbA1C: No results for input(s): HGBA1C in the last 72 hours. CBG: Recent Labs  Lab 02/06/17 1615 02/08/17 0724 02/08/17 0849 02/09/17 0730 02/09/17 0921  GLUCAP 98 64* 100* 71 105*   Lipid Profile: No results for input(s): CHOL, HDL, LDLCALC, TRIG, CHOLHDL, LDLDIRECT in the last 72 hours. Thyroid Function Tests: No results for input(s): TSH, T4TOTAL, FREET4, T3FREE, THYROIDAB in the last 72 hours. Anemia Panel: No results for input(s):  VITAMINB12, FOLATE, FERRITIN, TIBC, IRON, RETICCTPCT in the last 72 hours. Sepsis Labs: Recent Labs  Lab 02/06/17 1629  LATICACIDVEN 0.97    Recent Results (from the past 240 hour(s))  Urine Culture     Status: Abnormal   Collection Time: 02/06/17  2:57 PM  Result Value Ref Range Status   Specimen Description URINE, RANDOM  Final   Special Requests NONE  Final   Culture (A)  Final    <10,000 COLONIES/mL INSIGNIFICANT GROWTH Performed at [facility], [address] [place], [place] [address]    Report Status 02/08/2017 FINAL  Final  MRSA PCR Screening     Status: None   Collection Time: 02/07/17  9:06 PM  Result Value Ref Range Status   MRSA by PCR NEGATIVE NEGATIVE Final    Comment:        The GeneXpert MRSA Assay (FDA approved for NASAL specimens only),  is one component of a comprehensive MRSA colonization surveillance program. It is not intended to diagnose MRSA infection nor to guide or monitor treatment for MRSA infections.          Radiology Studies: Dg Chest Port 1 View  Result Date: 02/08/2017 CLINICAL DATA:  Acute encephalopathy. EXAM: PORTABLE CHEST 1 VIEW COMPARISON:  02/06/2017 FINDINGS: Both lungs are clear. Heart and mediastinum are within normal limits. Trachea is midline. Bone structures are intact. Negative for a pneumothorax. Degenerative changes with spurring along the inferior left glenohumeral joint. IMPRESSION: No active disease. Electronically Signed   By: Richarda OverlieAdam  Henn M.D.   On: 02/08/2017 07:27        Scheduled Meds: . amLODipine  10 mg Per Tube Daily  . aspirin  325 mg Per Tube Daily  . atorvastatin  20 mg Per Tube QHS  . chlorhexidine  15 mL Mouth Rinse BID  . enoxaparin (LOVENOX) injection  40 mg Subcutaneous Q24H  . feeding supplement (JEVITY 1.5 CAL/FIBER)  237 mL Per Tube TID  . feeding supplement (PRO-STAT SUGAR FREE 64)  30 mL Per Tube BID  . FLUoxetine  20 mg Per Tube Daily  . hydrALAZINE  25 mg Per Tube BID  .  levETIRAcetam  500 mg Per Tube BID  . mouth rinse  15 mL Mouth Rinse q12n4p  . risperiDONE  1 mg Per Tube QHS  . tiZANidine  2 mg Per Tube BID   Continuous Infusions: . sodium chloride 50 mL/hr at 02/09/17 0902     LOS: 3 days    Time spent: 40 minutes    Ramiro Harvestaniel Nyara Capell, MD [facility] Pager [phone]  If 7PM-7AM, please contact night-coverage [email or web address] Password [email or web address] 02/09/2017, 10:48 AM

## 2017-02-10 DIAGNOSIS — G92 Toxic encephalopathy: Secondary | ICD-10-CM

## 2017-02-10 LAB — BASIC METABOLIC PANEL
ANION GAP: 5 (ref 5–15)
BUN: 14 mg/dL (ref 6–20)
CHLORIDE: 106 mmol/L (ref 101–111)
CO2: 27 mmol/L (ref 22–32)
Calcium: 8.7 mg/dL — ABNORMAL LOW (ref 8.9–10.3)
Creatinine, Ser: 0.59 mg/dL — ABNORMAL LOW (ref 0.61–1.24)
GFR calc non Af Amer: >60 mL/min (ref >60)
Glucose, Bld: 86 mg/dL (ref 65–99)
POTASSIUM: 3.9 mmol/L (ref 3.5–5.1)
SODIUM: 138 mmol/L (ref 135–145)

## 2017-02-10 LAB — GLUCOSE, CAPILLARY: Glucose-Capillary: 80 mg/dL (ref 65–99)

## 2017-02-10 LAB — CBC
HEMATOCRIT: 35.4 % — AB (ref 39.0–52.0)
HEMOGLOBIN: 12 g/dL — AB (ref 13.0–17.0)
MCH: 30.2 pg (ref 26.0–34.0)
MCHC: 33.9 g/dL (ref 30.0–36.0)
MCV: 89.2 fL (ref 78.0–100.0)
PLATELETS: 148 10*3/uL — AB (ref 150–400)
RBC: 3.97 MIL/uL — AB (ref 4.22–5.81)
RDW: 12.8 % (ref 11.5–15.5)
WBC: 2.8 10*3/uL — AB (ref 4.0–10.5)

## 2017-02-10 MED ORDER — TIZANIDINE HCL 2 MG PO CAPS: 2.0000 mg | ORAL_CAPSULE | Freq: Two times a day (BID) | ORAL | 0 refills | Status: AC

## 2017-02-10 MED ORDER — ENOXAPARIN SODIUM 40 MG/0.4ML ~~LOC~~ SOLN: 40.0000 mg | SUBCUTANEOUS | Status: DC

## 2017-02-10 MED ORDER — CHLORHEXIDINE GLUCONATE 0.12 % MT SOLN: 15.0000 mL | Freq: Two times a day (BID) | OROMUCOSAL | 0 refills | Status: DC

## 2017-02-10 MED ORDER — PRO-STAT SUGAR FREE PO LIQD: 30.0000 mL | Freq: Two times a day (BID) | ORAL | 0 refills | Status: DC

## 2017-02-10 MED ORDER — JEVITY 1.5 CAL/FIBER PO LIQD: 237.0000 mL | Freq: Three times a day (TID) | ORAL | 0 refills | Status: AC

## 2017-02-10 NOTE — Progress Notes (Signed)
Occupational Therapy Evaluation Patient Details Name: Jeremy Brennan MRN: [id number] DOB: 10/24/1965 Today's Date: 02/10/2017    History of Present Illness 51 y.o. 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   Clinical Impression   Patient presents to OT at baseline with ADLs, which is dependent. Patient to return to SNF upon discharge, likely today. Recommend OT evaluation by SNF to see if he qualifies for OT services there. We will sign off.    Follow Up Recommendations  SNF    Equipment Recommendations  Other (comment)(tbd at next venue of care)    Recommendations for Other Services       Precautions / Restrictions Precautions Precautions: Fall Restrictions Weight Bearing Restrictions: No      Mobility Bed Mobility                  Transfers                      Balance                                           ADL either performed or assessed with clinical judgement   ADL Overall ADL's : At baseline                                       General ADL Comments: He requires assistance with all BADLs at baseline.      Vision         Perception     Praxis      Pertinent Vitals/Pain Pain Assessment: No/denies pain     Hand Dominance     Extremity/Trunk Assessment Upper Extremity Assessment Upper Extremity Assessment: RUE deficits/detail;LUE deficits/detail RUE Deficits / Details: right hemiparesis. PROM shoulder flexion about 20 degrees; PROM elbow flex/ext WFL, PROM wrist flexion WFL, wrist extension only to neutral, limited digit PROM LUE Deficits / Details: AAROM L shoulder WFL, AROM L elbow WFL, PROM L wrist flexion WFL, no wrist extension beyond neutral, limited digit ROM   Lower Extremity Assessment Lower  Extremity Assessment: Defer to PT evaluation       Communication Communication Communication: No difficulties   Cognition Arousal/Alertness: Awake/alert Behavior During Therapy: WFL for tasks assessed/performed Overall Cognitive Status: History of cognitive impairments - at baseline                                 General Comments: has dementia per chart   General Comments  Hands with odor; assisted patient with hand hygiene.    Exercises Exercises: General Upper Extremity General Exercises - Upper Extremity Shoulder Flexion: AROM;Left;10 reps;Supine(PROM to R shoulder) Shoulder Extension: AROM;Left;10 reps;Supine Elbow Flexion: AROM;Left;10 reps;Supine(PROM to right elbow) Elbow Extension: AROM;Left;10 reps;Supine   Shoulder Instructions      Home Living Family/patient expects to be discharged to:: Skilled nursing facility                                        Prior Functioning/Environment Level of Independence:  Needs assistance  Gait / Transfers Assistance Needed: reports he is mainly bedbound at the SNF; requires 2-person transfer to w/c ADL's / Homemaking Assistance Needed: Pt requires assistance for all ADL but reports working to improve independence at rehab.            OT Problem List: Decreased strength;Decreased range of motion;Decreased activity tolerance;Decreased knowledge of use of DME or AE;Decreased safety awareness;Impaired UE functional use;Impaired tone      OT Treatment/Interventions:      OT Goals(Current goals can be found in the care plan section) Acute Rehab OT Goals Patient Stated Goal: none stated OT Goal Formulation: All assessment and education complete, DC therapy  OT Frequency:     Barriers to D/C:            Co-evaluation              AM-PAC PT "6 Clicks" Daily Activity     Outcome Measure Help from another person eating meals?: Total Help from another person taking care of personal  grooming?: Total Help from another person toileting, which includes using toliet, bedpan, or urinal?: Total Help from another person bathing (including washing, rinsing, drying)?: Total Help from another person to put on and taking off regular upper body clothing?: Total Help from another person to put on and taking off regular lower body clothing?: Total 6 Click Score: 6   End of Session    Activity Tolerance: Patient tolerated treatment well Patient left: in bed;with call bell/phone within reach;with nursing/sitter in room  OT Visit Diagnosis: Muscle weakness (generalized) (M62.81);Hemiplegia and hemiparesis;Other abnormalities of gait and mobility (R26.89) Hemiplegia - Right/Left: Right Hemiplegia - caused by: Cerebral infarction                Time: 1140-1156 OT Time Calculation (min): 16 min Charges:  OT General Charges $OT Visit: 1 Visit OT Evaluation $OT Eval Moderate Complexity: 1 Mod G-Codes:       Margie Urbanowicz A Sahaana Weitman 02/10/2017, 12:08 PM

## 2017-02-10 NOTE — Progress Notes (Signed)
[facility].Awaiting PTAR

## 2017-02-10 NOTE — NC FL2 (Signed)
[place] MEDICAID FL2 LEVEL OF CARE SCREENING TOOL     IDENTIFICATION  Patient Name: Jeremy Brennan Birthdate: 06/11/1965 Sex: male Admission Date (Current Location): 02/06/2017  [facility] [place]   Facility and Address:  [facility],  [address], [place] [address]      Provider Number: [id number]  Attending Physician Name and Address:  Rodolph Bonghompson, Daniel V, MD  Relative Name and Phone Number:       Current Level of Care: Hospital Recommended Level of Care: Skilled Nursing Facility Prior Approval Number:    Date Approved/Denied:   PASRR Number: [id number]  Discharge Plan: SNF    Current Diagnoses: Patient Active Problem List   Diagnosis Date Noted  . Dehydration   . Acute metabolic encephalopathy 02/06/2017  . Cough 02/06/2017  . Acute conjunctivitis, bilateral 01/03/2017  . Seizure (HCC) 12/23/2016  . Chronic cerebrovascular accident (CVA) 12/01/2016  . Depression with suicidal ideation 12/01/2016  . Sudden cardiac arrest (HCC) 12/01/2016  . Unresponsive 12/01/2016  . Bradycardia 12/01/2016  . Hemiparesis affecting right side as late effect of stroke (HCC) 11/01/2016  . Contracture of joint of left hand 11/01/2016  . History of CVA (cerebrovascular accident) 11/01/2016  . Essential hypertension 11/01/2016  . Depression, recurrent (HCC) 11/01/2016  . Mixed hyperlipidemia 11/01/2016  . Dysphagia 11/01/2016  . S/P percutaneous endoscopic gastrostomy (PEG) tube placement (HCC) 11/01/2016  . Dysarthria 11/01/2016    Orientation RESPIRATION BLADDER Height & Weight     Self, Time, Place  Normal Incontinent Weight: 173 lb 4.5 oz (78.6 kg) Height:  6' (182.9 cm)  BEHAVIORAL SYMPTOMS/MOOD NEUROLOGICAL BOWEL NUTRITION STATUS      Incontinent Diet, Feeding tube(Thin liquid;Nectar-thick liquid(full liquids))  AMBULATORY STATUS COMMUNICATION OF NEEDS Skin   Total Care Verbally Normal                       Personal Care  Assistance Level of Assistance  Bathing, Feeding, Dressing Bathing Assistance: Maximum assistance Feeding assistance: Maximum assistance Dressing Assistance: Maximum assistance     Functional Limitations Info  Sight, Hearing, Speech Sight Info: Adequate Hearing Info: Impaired Speech Info: Adequate    SPECIAL CARE FACTORS FREQUENCY  Speech therapy             Speech Therapy Frequency: Min 1x/week      Contractures Contractures Info: Not present    Additional Factors Info  Code Status, Allergies Code Status Info: Full Code Allergies Info: NKA           Current Medications (02/10/2017):  This is the current hospital active medication list Current Facility-Administered Medications  Medication Dose Route Frequency Provider Last Rate Last Dose  . 0.9 %  sodium chloride infusion   Intravenous Continuous Rodolph Bonghompson, Daniel V, MD 75 mL/hr at 02/10/17 [phone]    . acetaminophen (TYLENOL) tablet 650 mg  650 mg Oral Q6H PRN Lorretta HarpNiu, Xilin, MD       Or  . acetaminophen (TYLENOL) suppository 650 mg  650 mg Rectal Q6H PRN Lorretta HarpNiu, Xilin, MD      . amLODipine (NORVASC) tablet 10 mg  10 mg Per Tube Daily Lorretta HarpNiu, Xilin, MD   10 mg at 02/10/17 0849  . aspirin tablet 325 mg  325 mg Per Tube Daily Lorretta HarpNiu, Xilin, MD   325 mg at 02/10/17 0848  . atorvastatin (LIPITOR) tablet 20 mg  20 mg Per Tube QHS Lorretta HarpNiu, Xilin, MD   20 mg at 02/09/17 2207  . chlorhexidine (  PERIDEX) 0.12 % solution 15 mL  15 mL Mouth Rinse BID Rodolph Bonghompson, Daniel V, MD   15 mL at 02/10/17 0848  . enoxaparin (LOVENOX) injection 40 mg  40 mg Subcutaneous Q24H Lorretta HarpNiu, Xilin, MD   40 mg at 02/09/17 2207  . feeding supplement (JEVITY 1.5 CAL/FIBER) liquid 237 mL  237 mL Per Tube TID Rodolph Bonghompson, Daniel V, MD   237 mL at 02/10/17 0849  . feeding supplement (PRO-STAT SUGAR FREE 64) liquid 30 mL  30 mL Per Tube BID Rodolph Bonghompson, Daniel V, MD   30 mL at 02/10/17 0848  . FLUoxetine (PROZAC) capsule 20 mg  20 mg Per Tube Daily Lorretta HarpNiu, Xilin, MD   20 mg at 02/10/17 0848   . guaiFENesin (ROBITUSSIN) 100 MG/5ML solution 200 mg  200 mg Oral Q4H PRN Lorretta HarpNiu, Xilin, MD      . hydrALAZINE (APRESOLINE) injection 5 mg  5 mg Intravenous Q2H PRN Lorretta HarpNiu, Xilin, MD      . hydrALAZINE (APRESOLINE) tablet 25 mg  25 mg Per Tube BID Lorretta HarpNiu, Xilin, MD   25 mg at 02/10/17 0849  . levETIRAcetam (KEPPRA) 100 MG/ML solution 500 mg  500 mg Per Tube BID Lorretta HarpNiu, Xilin, MD   500 mg at 02/10/17 0848  . LORazepam (ATIVAN) injection 1 mg  1 mg Intravenous Q12H PRN Lorretta HarpNiu, Xilin, MD      . MEDLINE mouth rinse  15 mL Mouth Rinse q12n4p Rodolph Bonghompson, Daniel V, MD   15 mL at 02/09/17 1614  . ondansetron (ZOFRAN) tablet 4 mg  4 mg Oral Q6H PRN Lorretta HarpNiu, Xilin, MD       Or  . ondansetron [facility](ZOFRAN) injection 4 mg  4 mg Intravenous Q6H PRN Lorretta HarpNiu, Xilin, MD      . risperiDONE (RISPERDAL) tablet 1 mg  1 mg Per Tube QHS Lorretta HarpNiu, Xilin, MD   1 mg at 02/09/17 2207  . tiZANidine (ZANAFLEX) tablet 2 mg  2 mg Per Tube BID Rodolph Bonghompson, Daniel V, MD   2 mg at 02/10/17 [id number]     Discharge Medications: Please see discharge summary for a list of discharge medications.  Relevant Imaging Results:  Relevant Lab Results:   Additional Information SS#: [id number]  Antionette PolesKimberly L Vonnetta Akey, LCSW

## 2017-02-10 NOTE — Evaluation (Signed)
Physical Therapy Evaluation Patient Details Name: Jeremy Brennan C Bentz MRN: [id number] DOB: 10/11/1965 Today's Date: 02/10/2017   History of Present Illness  51 y.o. 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  Clinical Impression  Pt admitted as above and presenting with functional mobility limitations 2* strength, ROM, balance, and cognitive deficits associated with previous CVAs.  Pt will require follow up rehab at SNF level.    Follow Up Recommendations SNF    Equipment Recommendations  None recommended by PT    Recommendations for Other Services       Precautions / Restrictions Precautions Precautions: Fall Restrictions Weight Bearing Restrictions: No      Mobility  Bed Mobility Overal bed mobility: Needs Assistance Bed Mobility: Supine to Sit;Sit to Supine     Supine to sit: Min assist;+2 for safety/equipment;Mod assist Sit to supine: +2 for safety/equipment;Mod assist   General bed mobility comments: cues for sequence with HOB elevated.  Increased assist required to scoot to EOB utilizing pad  Transfers Overall transfer level: Needs assistance Equipment used: 2 person hand held assist Transfers: Sit to/from Stand Sit to Stand: Mod assist;+2 physical assistance;+2 safety/equipment         General transfer comment: Pt stood x 2 with assist to bring wt up and fwd and to correct for posterior lean in standing  Ambulation/Gait Ambulation/Gait assistance: Min assist;Mod assist;+2 physical assistance;+2 safety/equipment           General Gait Details: Pt stood x 2 but unable to initiate step with L LE 2* weakness and R LE 2* difficulty WB on L LE.  Pt stood ~3 min total.   Stairs            Wheelchair Mobility    Modified Rankin (Stroke Patients Only)       Balance  Overall balance assessment: Needs assistance Sitting-balance support: No upper extremity supported;Feet supported Sitting balance-Leahy Scale: Fair     Standing balance support: Bilateral upper extremity supported Standing balance-Leahy Scale: Poor Standing balance comment: posterior lean with ltd WB tolerated on L LE                             Pertinent Vitals/Pain Pain Assessment: No/denies pain    Home Living Family/patient expects to be discharged to:: Skilled nursing facility                      Prior Function Level of Independence: Needs assistance   Gait / Transfers Assistance Needed: Pt reports requiring assist for all mobility tasks  ADL's / Homemaking Assistance Needed: Pt requires assistance for all ADL but reports working to improve independence at rehab.        Hand Dominance        Extremity/Trunk Assessment   Upper Extremity Assessment Upper Extremity Assessment: RUE deficits/detail;LUE deficits/detail RUE Deficits / Details: right hemiparesis. PROM shoulder flexion about 20 degrees; PROM elbow flex/ext WFL, PROM wrist flexion WFL, wrist extension only to neutral, limited digit PROM LUE Deficits / Details: AAROM L shoulder WFL, AROM L elbow WFL, PROM L wrist flexion WFL, no wrist extension beyond neutral, limited digit ROM    Lower Extremity Assessment Lower Extremity Assessment: RLE deficits/detail;LLE deficits/detail RLE Deficits / Details: AROM WFL with strength 3+/5 RLE Coordination: decreased gross motor LLE Deficits / Details:  Strength ~2-/5 with trace only at DF LLE Coordination: decreased fine motor;decreased gross motor       Communication   Communication: No difficulties  Cognition Arousal/Alertness: Awake/alert Behavior During Therapy: WFL for tasks assessed/performed Overall Cognitive Status: History of cognitive impairments - at baseline                                 General Comments: has dementia  per chart      General Comments General comments (skin integrity, edema, etc.): Hands with odor; assisted patient with hand hygiene.    Exercises General Exercises - Upper Extremity Shoulder Flexion: AROM;Left;10 reps;Supine(PROM to R shoulder) Shoulder Extension: AROM;Left;10 reps;Supine Elbow Flexion: AROM;Left;10 reps;Supine(PROM to right elbow) Elbow Extension: AROM;Left;10 reps;Supine   Assessment/Plan    PT Assessment Patient needs continued PT services  PT Problem List Decreased strength;Decreased range of motion;Decreased activity tolerance;Decreased balance;Decreased mobility;Decreased knowledge of use of DME;Decreased cognition;Decreased safety awareness       PT Treatment Interventions DME instruction;Gait training;Functional mobility training;Therapeutic activities;Therapeutic exercise;Balance training;Patient/family education    PT Goals (Current goals can be found in the Care Plan section)  Acute Rehab PT Goals Patient Stated Goal: none stated PT Goal Formulation: Patient unable to participate in goal setting Time For Goal Achievement: 02/17/17 Potential to Achieve Goals: Fair    Frequency Min 3X/week   Barriers to discharge        Co-evaluation               AM-PAC PT "6 Clicks" Daily Activity  Outcome Measure Difficulty turning over in bed (including adjusting bedclothes, sheets and blankets)?: Unable Difficulty moving from lying on back to sitting on the side of the bed? : Unable Difficulty sitting down on and standing up from a chair with arms (e.g., wheelchair, bedside commode, etc,.)?: Unable Help needed moving to and from a bed to chair (including a wheelchair)?: A Lot Help needed walking in hospital room?: Total Help needed climbing 3-5 steps with a railing? : Total 6 Click Score: 7    End of Session Equipment Utilized During Treatment: Gait belt Activity Tolerance: Patient tolerated treatment well Patient left: in bed;with call bell/phone  within reach Nurse Communication: Mobility status PT Visit Diagnosis: Hemiplegia and hemiparesis;Difficulty in walking, not elsewhere classified (R26.2) Hemiplegia - Right/Left: Right Hemiplegia - dominant/non-dominant: Dominant Hemiplegia - caused by: Cerebral infarction    Time: 1010-1040 PT Time Calculation (min) (ACUTE ONLY): 30 min   Charges:   PT Evaluation $PT Eval Low Complexity: 1 Low $PT Eval Moderate Complexity: 1 Mod PT Treatments $Therapeutic Activity: 8-22 mins   PT G Codes:        Pg [phone]   Evon Lopezperez 02/10/2017, 12:31 PM

## 2017-02-10 NOTE — Discharge Summary (Signed)
Physician Discharge Summary  Jeremy Brennan JYN:[id number] DOB: 08/16/1965 DOA: 02/06/2017  PCP: Kirt Boys, DO  Admit date: 02/06/2017 Discharge date: 02/10/2017  Time spent: 60 minutes  Recommendations for Outpatient Follow-up:  1. Follow-up with Kirt Boys, DO in 1-2 weeks/MD at skilled nursing facility.  Patient will need a basic metabolic profile done to follow-up on electrolytes and renal function.   Discharge Diagnoses:  Principal Problem:   Acute metabolic encephalopathy Active Problems:   Hemiparesis affecting right side as late effect of stroke (HCC)   History of CVA (cerebrovascular accident)   Essential hypertension   Depression, recurrent (HCC)   Mixed hyperlipidemia   Bradycardia   Seizure (HCC)   Cough   Dehydration   Discharge Condition: Stable and improved  Diet recommendation: Full liquid diet with thin liquids.  Aspiration precautions.  Jevity tube feeds 3 times daily with meals.  Filed Weights   02/06/17 2114  Weight: 78.6 kg (173 lb 4.5 oz)    History of present illness:  Per Elford Evilsizer is a 51 y.o. 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  Patient has AMS and AMS. He was unable to provide any medical history, therefore, most of the history was obtained by discussing the case with ED physician, per EMS report, and with the nursing staff.  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 He is normally alert and verbal,  however he was found to be lethargic and altered by staff in facility today. He was noted to have cough. When seen pt in ED, he was not oriented x 3. He had right-sided hemiparalysis and right facial droop. He did not have acute respiratory distress, not actively coughing. No active nausea,  vomiting or diarrhea noted. Not sure if he has any chest pain, abdominal pain or symptoms of UTI.  ED Course: pt was found to have WBC 4.4, lactic acid 0.97, pending urinalysis, electrolytes renal function okay, heart rate 50s-90s, no tachypnea, oxygen saturation 98% on room air, temperature normal. CT head was negative for acute intracranial abnormalities. Chest x-rays negative.      Hospital Course:  #1 acute metabolic encephalopathy Patient had presented with altered mental status. ??  Etiology.  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  She was pancultured with results with no growth to date.  Patient placed on IV fluids and monitored.  Patient improved clinically and slowly during the hospitalization became less drowsy and more alert and following commands and answering some questions appropriately.  CT head was negative for any acute abnormalities.  Urinalysis was nitrite negative, leukocytes negative.  Blood pressure improved with holding oral antihypertensive agents.  X-ray negative for any acute infiltrate.  Urine cultures insignificant growth.  Zanaflex dose was decreased to twice daily with continued clinical improvement.  Patient was maintained on supportive care.  Outpatient follow-up.   2.  History of CVA with right-sided hemiparesis Patient presented with altered mental status.  Patient with a right hemiparesis.  Continued on aspirin for secondary stroke prophylaxis.  Continued on Lipitor.  Risk factor modification.  PT/OT.  3.  Recurrent depression Maintained on home regimen of Prozac.    4.  Hypertension  Blood pressure was borderline, however improved slowly with holding oral antihypertensive medications and continued hydration.  As blood pressure improved patient's antihypertensive medications were resumed.  Outpatient  follow-up.  5.  Dehydration Patient noted to be dehydrated on admission.  Patient was hydrated with IV fluids and was euvolemic by day of discharge.    6.  Hyperlipidemia Continued on home regimen of statin.    7.  History of seizures Placed on seizure precautions and continued on home regimen of Keppra.  Ativan was also ordered as needed.  Patient had no seizures during the hospitalization.    8.  Cough Chest x-ray was negative.  Patient with no fever or leukocytosis.  Patient was seen by speech therapist who recommended a full liquid diet, with thin liquids.  Patient did not have any further coughing during the hospitalization.  Outpatient follow-up.       Procedures:  CT head without contrast 02/06/2017  Chest x-ray 02/06/2017      Consultations:  None  Discharge Exam: Vitals:   02/10/17 0500 02/10/17 0505  BP: (!) 116/91 112/72  Pulse: 65 60  Resp:    Temp:    SpO2:      General: NAD Cardiovascular: RRR Respiratory: CTAB  Discharge Instructions   Discharge Instructions    Diet general   Complete by:  As directed    FULL LIQUID DIET WITH THIN LIQUIDS. ASPIRATION PRECAUTIONS.   Increase activity slowly   Complete by:  As directed      Allergies as of 02/10/2017   No Known Allergies     Medication List    TAKE these medications   amLODipine 10 MG tablet Commonly known as:  NORVASC Place 10 mg into feeding tube daily.   aspirin 325 MG tablet Place 325 mg into feeding tube daily.   atorvastatin 20 MG tablet Commonly known as:  LIPITOR Place 20 mg into feeding tube at bedtime.   chlorhexidine 0.12 % solution Commonly known as:  PERIDEX 15 mLs by Mouth Rinse route 2 (two) times daily.   enoxaparin 40 MG/0.4ML injection Commonly known as:  LOVENOX Inject 0.4 mLs (40 mg total) into the skin daily.   feeding supplement (JEVITY 1.5 CAL/FIBER) Liqd Place 237 mLs into feeding tube 3 (three) times daily with meals for 30 doses. What  changed:    when to take this  reasons to take this   feeding supplement (PRO-STAT SUGAR FREE 64) Liqd Place 30 mLs into feeding tube 2 (two) times daily.   FLUoxetine 20 MG capsule Commonly known as:  PROZAC Place 20 mg into feeding tube daily.   hydrALAZINE 25 MG tablet Commonly known as:  APRESOLINE Place 25 mg into feeding tube 2 (two) times daily.   levETIRAcetam 100 MG/ML solution Commonly known as:  KEPPRA Place 5 mLs (500 mg total) into feeding tube 2 (two) times daily.   risperiDONE 1 MG tablet Commonly known as:  RISPERDAL Place 1 mg into feeding tube at bedtime.   tizanidine 2 MG capsule Commonly known as:  ZANAFLEX Place 1 capsule (2 mg total) into feeding tube 2 (two) times daily. What changed:  when to take this      No Known Allergies  Contact information for follow-up providers    MD AT FACILITY Follow up.            Contact information for after-discharge care    Destination    [facility] Follow up.   Service:  Skilled  Nursing Contact information: [address] [place] [id number] [phone]                   The results of significant diagnostics from this hospitalization (including imaging, microbiology, ancillary and laboratory) are listed below for reference.    Significant Diagnostic Studies: Ct Head Wo Contrast  Result Date: 02/06/2017 CLINICAL DATA:  Unexplained altered level of consciousness. EXAM: CT HEAD WITHOUT CONTRAST TECHNIQUE: Contiguous axial images were obtained from the base of the skull through the vertex without intravenous contrast. COMPARISON:  12/23/2016 FINDINGS: Brain: No evidence of acute infarction, hemorrhage, hydrocephalus, extra-axial collection, or mass lesion/mass effect. Old left occipital lobe infarct is unchanged in appearance. Severe chronic small vessel disease is also stable. Vascular:  No hyperdense vessel or other acute findings. Skull: No evidence of  fracture or other significant bone abnormality. Sinuses/Orbits:  No acute findings. Other: None. IMPRESSION: No acute intracranial abnormality. Stable left occipital lobe infarct and severe chronic small vessel disease. Electronically Signed   By: Myles RosenthalJohn  Stahl M.D.   On: 02/06/2017 18:53   Dg Chest Port 1 View  Result Date: 02/08/2017 CLINICAL DATA:  Acute encephalopathy. EXAM: PORTABLE CHEST 1 VIEW COMPARISON:  02/06/2017 FINDINGS: Both lungs are clear. Heart and mediastinum are within normal limits. Trachea is midline. Bone structures are intact. Negative for a pneumothorax. Degenerative changes with spurring along the inferior left glenohumeral joint. IMPRESSION: No active disease. Electronically Signed   By: Richarda OverlieAdam  Henn M.D.   On: 02/08/2017 07:27   Dg Chest Portable 1 View  Result Date: 02/06/2017 CLINICAL DATA:  Altered mental status. EXAM: PORTABLE CHEST 1 VIEW COMPARISON:  12/23/2016 FINDINGS: 1545 hours. Low volume film. The lungs are clear without focal pneumonia, edema, pneumothorax or pleural effusion. Cardiopericardial silhouette is at upper limits of normal for size. The visualized bony structures of the thorax are intact. Telemetry leads overlie the chest. IMPRESSION: No active disease. Electronically Signed   By: Kennith CenterEric  Mansell M.D.   On: 02/06/2017 15:56    Microbiology: Recent Results (from the past 240 hour(s))  Urine Culture     Status: Abnormal   Collection Time: 02/06/17  2:57 PM  Result Value Ref Range Status   Specimen Description URINE, RANDOM  Final   Special Requests NONE  Final   Culture (A)  Final    <10,000 COLONIES/mL INSIGNIFICANT GROWTH Performed at [facility], [address] [place], [place] [address]    Report Status 02/08/2017 FINAL  Final  MRSA PCR Screening     Status: None   Collection Time: 02/07/17  9:06 PM  Result Value Ref Range Status   MRSA by PCR NEGATIVE NEGATIVE Final    Comment:        The GeneXpert MRSA Assay (FDA approved for NASAL  specimens only), is one component of a comprehensive MRSA colonization surveillance program. It is not intended to diagnose MRSA infection nor to guide or monitor treatment for MRSA infections.      Labs: Basic Metabolic Panel: Recent Labs  Lab 02/06/17 1616 02/06/17 1629 02/07/17 0412 02/08/17 0516 02/09/17 0423 02/10/17 0623  NA 138 140 140 140 139 138  K 3.6 3.6 3.5 3.7 3.7 3.9  CL 103 101 108 109 108 106  CO2 27  --  27 26 26 27   GLUCOSE 108* 108* 90 83 86 86  BUN 10 8 9 20 19 14   CREATININE 0.71 0.80 0.68 0.80 0.72 0.59*  CALCIUM 9.6  --  9.1 9.0 8.3* 8.7*  MG  --   --   --  1.7 1.9  --    Liver Function Tests: Recent Labs  Lab 02/06/17 02/06/17 1616  AST 9* 12*  ALT 9* 11*  ALKPHOS 58 56  BILITOT  --  0.7  PROT  --  7.7  ALBUMIN  --  4.1   No results for input(s): LIPASE, AMYLASE in the last 168 hours. Recent Labs  Lab 02/06/17 1617  AMMONIA 11   CBC: Recent Labs  Lab 02/06/17 1616 02/06/17 1629 02/07/17 0412 02/08/17 0516 02/09/17 0423 02/10/17 0758  WBC 4.4  --  3.0* 4.3 3.5* 2.8*  NEUTROABS 3.1  --   --  2.5  --   --   HGB 13.6 14.6 11.8* 11.5* 10.5* 12.0*  HCT 40.5 43.0 35.8* 35.2* 31.8* 35.4*  MCV 89.8  --  89.7 90.5 90.1 89.2  PLT 178  --  168 145* 154 148*   Cardiac Enzymes: No results for input(s): CKTOTAL, CKMB, CKMBINDEX, TROPONINI in the last 168 hours. BNP: BNP (last 3 results) Recent Labs    02/07/17 0412  BNP 16.8    ProBNP (last 3 results) No results for input(s): PROBNP in the last 8760 hours.  CBG: Recent Labs  Lab 02/08/17 0724 02/08/17 0849 02/09/17 0730 02/09/17 0921 02/10/17 0744  GLUCAP 64* 100* 71 105* 80       Signed:  Ramiro Harvestaniel Iretta Mangrum MD.  [facility] 02/10/2017, 11:05 AM

## 2017-02-10 NOTE — Clinical Social Work Note (Signed)
Clinical Social Work Assessment  Patient Details  Name: Jeremy Brennan C Knisely MRN: [id number] Date of Birth: 10/07/1965  Date of referral:  02/10/17               Reason for consult:  Discharge Planning                Permission sought to share information with:  Facility Industrial/product designerContact Representative Permission granted to share information::     Name::        Agency::     Relationship::     Contact Information:     Housing/Transportation Living arrangements for the past 2 months:  Skilled Nursing Facility Source of Information:  Other (Comment Required)(Relative Midge AverWillie Gillion) Patient Interpreter Needed:  None Criminal Activity/Legal Involvement Pertinent to Current Situation/Hospitalization:  No - Comment as needed Significant Relationships:    Lives with:  Facility Resident Do you feel safe going back to the place where you live?  Yes Need for family participation in patient care:  Yes (Comment)  Care giving concerns:  [facility] term care.   Social Worker assessment / plan:  CSW spoke with patient's relative listed under emergency contacts Ivar Drape(Willie LocoGillion  [phone]). Patient's relative confirmed plan for patient to discharge back to current SNF.   CSW completed FL2 and contacted [facility] who confirmed patient's ability to return.  CSW will continue to follow and assist with discharge planning.  Employment status:  Disabled (Comment on whether or not currently receiving Disability) Insurance information:  [place] Recommend[place]:  Not assessed at this time Information / Referral to community resources:  ([facility])  Patient/Family's Response to care:  Patient's relative confirmed plan for patient to dc back to SNF.  Patient/Family's Understanding of and Emotional Response to Diagnosis, Current Treatment, and Prognosis:  Unknown  Emotional Assessment Appearance:    Attitude/Demeanor/Rapport:  Unable to  Assess Affect (typically observed):  Unable to Assess Orientation:  Oriented to Place, Oriented to  Time, Oriented to Self Alcohol / Substance use:  Not Applicable Psych involvement (Current and /or in the community):  No (Comment)  Discharge Needs  Concerns to be addressed:  Care Coordination Readmission within the last 30 days:  No Current discharge risk:  None Barriers to Discharge:  No Barriers Identified   Antionette PolesKimberly L Tyreona Panjwani, LCSW 02/10/2017, 11:09 AM

## 2017-02-10 NOTE — Care Management Note (Signed)
Case Management Note  Patient Details  Name: Jeremy Brennan C Caldera MRN: [id number] Date of Birth: 05/17/1965  Subjective/Objective:                    Action/Plan:d/c SNF.   Expected Discharge Date:  02/10/17               Expected Discharge Plan:  Skilled Nursing Facility  In-House Referral:  Clinical Social Work  Discharge planning Services  CM Consult  Post Acute Care Choice:    Choice offered to:     DME Arranged:    DME Agency:     HH Arranged:    HH Agency:     Status of Service:  Completed, signed off  If discussed at [employer] Meetings, dates discussed:    Additional Comments:  Lanier ClamMahabir, Delene Morais, RN 02/10/2017, 11:09 AM

## 2017-02-10 NOTE — Progress Notes (Signed)
Patient returning to [facility]. Facility aware of patient's discharge and confirmed patient's ability to return. PTAR contacted, patient's family notified. Patient's RN can call report to [phone], packet complete. CSW signing off, no other needs identified.  Celso SickleKimberly Devanta Daniel, [place] Clinical Social Worker [facility] Cell#: [phone]

## 2017-02-11 ENCOUNTER — Encounter: Payer: Self-pay | Admitting: Adult Health

## 2017-02-11 ENCOUNTER — Non-Acute Institutional Stay (SKILLED_NURSING_FACILITY): Payer: Medicaid Other | Admitting: Adult Health

## 2017-02-11 DIAGNOSIS — Z931 Gastrostomy status: Secondary | ICD-10-CM

## 2017-02-11 DIAGNOSIS — E782 Mixed hyperlipidemia: Secondary | ICD-10-CM | POA: Diagnosis not present

## 2017-02-11 DIAGNOSIS — R569 Unspecified convulsions: Secondary | ICD-10-CM | POA: Diagnosis not present

## 2017-02-11 DIAGNOSIS — I69351 Hemiplegia and hemiparesis following cerebral infarction affecting right dominant side: Secondary | ICD-10-CM | POA: Diagnosis not present

## 2017-02-11 DIAGNOSIS — I6930 Unspecified sequelae of cerebral infarction: Secondary | ICD-10-CM

## 2017-02-11 DIAGNOSIS — I10 Essential (primary) hypertension: Secondary | ICD-10-CM

## 2017-02-11 DIAGNOSIS — R1310 Dysphagia, unspecified: Secondary | ICD-10-CM | POA: Diagnosis not present

## 2017-02-11 NOTE — Progress Notes (Signed)
Location:   [facility] Room Number: 229 B Place of Service:  SNF (31)   CODE STATUS: Full Code  No Known Allergies  Chief Complaint  Patient presents with  . Hospitalization Follow-up    Hospital Follow up    HPI:  He is a 51 year old long term resident of this facility being seen after being hospitalized. He was hospitalized for acute metabolic encephalopathy and dehydration. However; after reviewing his lab work; I cannot find any indications that he was dehydrated. He continues to require tube feeding for his nutritional support. His urine culture grew <10,000.  He is unable to fully participate in the hpi or ros. There are no reports of uncontrolled pain; no changes in mentation. His appetite is poor. He will continue to be followed for his chronic illnesses including: hypertension; seizures; cva. There are no nursing concerns at this time.   Past Medical History:  Diagnosis Date  . Arthritis   . Contracture, left hand   . 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 Depression   . Hyperlipidemia   . Hypertension   . Stroke [facility](HCC)     Past Surgical History:  Procedure Laterality Date  . HERNIA REPAIR    . PEG TUBE PLACEMENT  07/01/2016    Social History   Socioeconomic History  . Marital status: Widowed    Spouse name: Not on file  . Number of children: Not on file  . Years of education: Not on file  . Highest education level: Not on file  Social Needs  . Financial resource strain: Not on file  . Food insecurity - worry: Not on file  . Food insecurity - inability: Not on file  . Transportation needs - medical: Not on file  . Transportation needs - non-medical: Not on file  Occupational History  . Not on file  Tobacco Use  . Smoking status: Never Smoker  . Smokeless tobacco: Never Used  Substance and Sexual Activity  . Alcohol use: Not on file  . Drug use: No  . Sexual activity: Not on file  Other Topics Concern  . Not on file  Social History Narrative   . Not on file   Family History  Problem Relation Age of Onset  . Hypertension Other       VITAL SIGNS BP 117/69   Pulse 69   Temp 98 F (36.7 C)   Resp 12   Ht 6' (1.829 m)   Wt 182 lb 14.4 oz (83 kg)   SpO2 94%   BMI 24.81 kg/m    Outpatient Encounter Medications as of 02/11/2017  Medication Sig  . Amino Acids-Protein Hydrolys (FEEDING SUPPLEMENT, PRO-STAT SUGAR FREE 64,) LIQD Place 30 mLs into feeding tube 2 (two) times daily.  Marland Kitchen. amLODipine (NORVASC) 10 MG tablet Place 10 mg into feeding tube daily.  Marland Kitchen. aspirin 325 MG tablet Place 325 mg into feeding tube daily.  Marland Kitchen. atorvastatin (LIPITOR) 20 MG tablet Place 20 mg into feeding tube at bedtime.   . enoxaparin (LOVENOX) 40 MG/0.4ML injection Inject 0.4 mLs (40 mg total) into the skin daily.  Marland Kitchen. FLUoxetine (PROZAC) 20 MG capsule Place 20 mg into feeding tube daily.  . hydrALAZINE (APRESOLINE) 25 MG tablet Place 25 mg into feeding tube 2 (two) times daily.  Marland Kitchen. levETIRAcetam (KEPPRA) 100 MG/ML solution Place 5 mLs (500 mg total) into feeding tube 2 (two) times daily.  . Nutritional Supplements (FEEDING SUPPLEMENT, JEVITY 1.5 CAL/FIBER,) LIQD Place 237 mLs  into feeding tube 3 (three) times daily with meals for 30 doses.  Marland Kitchen risperiDONE (RISPERDAL) 1 MG tablet Place 1 mg into feeding tube at bedtime.  . tizanidine (ZANAFLEX) 2 MG capsule Place 1 capsule (2 mg total) into feeding tube 2 (two) times daily.  . [DISCONTINUED] chlorhexidine (PERIDEX) 0.12 % solution 15 mLs by Mouth Rinse route 2 (two) times daily. (Patient not taking: Reported on 02/11/2017)   No facility-administered encounter medications on file as of 02/11/2017.      SIGNIFICANT DIAGNOSTIC EXAMS   PREVIOUS:   12-01-16: chest x-ray: Mild cardiomegaly without pulmonary edema. No acute cardiopulmonary abnormality.  12-01-16: ct of head:  1. No acute intracranial abnormality. 2. Remote cortical stroke involving the left occipital lobe and left superior temporal lobe  (left posterior cerebral artery distribution). 3. Remote stroke involving the pons. 4. Severe chronic microvascular ischemic changes involving the white Matter.  12-01-16: ct angio of chest:  1. No evidence of acute pulmonary embolism is seen. 2. Fusiform dilatation of the ascending thoracic aorta measuring up to 41 mm in diameter. Recommend annual imaging followup by CTA or MRA.  3. Possible small hiatal hernia.  12-02-16: EEG:  EEG Abnormalities: None Clinical Interpretation: This normal EEG is recorded in the waking state. There was no seizure or seizure predisposition recorded on this study. Please note that a normal EEG does not preclude the possibility of epilepsy.    12-02-16: 2-d echo:  - Left ventricle: The cavity size was mildly dilated. Wall motion  was normal; there were no regional wall motion abnormalities. Mildly reduced global EF 50%   Doppler parameters are consistent with abnormal left ventricular relaxation (grade 1 diastolic dysfunction). - No significant valvular abnormality - Mild dilatation of aortic root (4.1 cm) and proximal ascending aorta (3.9 cm)   12-23-16: chest x-ray: There is no pneumonia nor other acute cardiopulmonary abnormality.  12-23-16: ct of head: 1. Multifocal infarcts. Extensive periventricular decreased attenuation, suspect small vessel vascular disease, age advanced, although a degree of superimposed demyelination cannot be excluded. Small vessel disease also noted in the basilar perforator distribution of the mid pons. No acute infarct evident. No intracranial mass, hemorrhage, or extra-axial fluid collection. 2.  Foci of arterial vascular calcification, age advanced. 3.  Presumed cerumen in each external auditory canal.  12-24-16: EEG: This awake EEG is abnormal due to diffuse slowing of the waking background.   12-25-16: MRI: of brain:  1. Motion degraded examination without evidence of acute intracranial abnormality. 2. Chronic left PCA  infarct. 3. Extensive chronic small vessel ischemic changes including chronic lacunar infarcts as above. 4. Numerous chronic microhemorrhages in a pattern consistent with chronic hypertension.  TODAY:  02-06-17: chest x-ray: No active disease.   02-06-17: ct of head: No acute intracranial abnormality. Stable left occipital lobe infarct and severe chronic small vessel disease  02-08-17: chest x-ray: No active disease.      LABS REVIEWED; PREVIOUS  10-26-16: wbc 4.6; hgb 13.5; hct 41.7; mcv 91.8; plt 166 glucose 100; bun 17.2; creat 0.72; k+ 4.4 ;na++ 141; liver normal albumin 3.7 hgb a1c 4.5  11-05-16: glucose 85; bun 9.3; creat 0.66; k+ 4.2; na++ 138; ca 9.7 12-01-16: wbc 2.8; hgb 11.1; hct 33.6; mcv 88.2. plt 131; glucose 99; bun 17; creat 1.04; k+ 3.7; na++ 138; ca 8.9; liver normal albumin 3.1 12-03-16: wbc 2.8; hgb 12.1; hct 35.6; mcv 86.0; plt 143glucose 85; bun 7; creat 0.78; k+ 3.5; na++ 139; ca 9.2 12-23-16: wbc 5.4; hgb 12.8; hct  38.3; mcv 87.2; plt 185; glucose 105; bun 10; creat 0.75; k+ 3.3; na++ 137; ca 9.3; ast 13 alt 12; albumin 3.8 12-25-16: wbc 3.1; hgb 11.4; hct 35.6; mcv 89.7; plt 171; glucose 81; bun 14; creat 0.91; k+ 3.5; na++ 140; ca 9.0; ammonia 34  TODAY:  02-06-17: wbc 4.4; hgb 13.6; hct 40.5; mcv 89.8; plt 178; glucose 108; bun 10; creat 0.71; k+ 3.6; na++138; ca  9.6; liver normal albumin 4.1; urine culture: <10,000 colonies 02-08-17: wbc 4.3; hgb 11.5; hct 35.2; mcv 90.5 ;plt 145; glucose 83; bun 20; creat 0.80; k+ 3.7; na++ 140; ca 9.0; mag 1.7 02-10-17: wbc 2.8; hgb 12.0; hct 35.4; mcv 89.2; plt 148; glucose 86; bun 14; creat 0.59; k+ 3.9; na++ 138; ca 8.7         Review of Systems  Unable to perform ROS: Other (expressive aphasia )   Physical Exam  Constitutional: He appears well-developed and well-nourished. No distress.  Neck: No thyromegaly present.  Cardiovascular: Normal rate, regular rhythm and intact distal pulses.  Murmur heard. 1/6    Pulmonary/Chest: Effort normal and breath sounds normal. No respiratory distress.  Abdominal: Soft. Bowel sounds are normal. He exhibits no distension. There is no tenderness.  Peg tube without signs of infection present   Musculoskeletal: He exhibits no edema.  Right hemiparesis Fingers contracted   Lymphadenopathy:    He has no cervical adenopathy.  Neurological: He is alert.  Skin: Skin is warm and dry. He is not diaphoretic.  Psychiatric: He has a normal mood and affect.    ASSESSMENT/ PLAN:  TODAY:   1. 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   2. Essential hypertension: stable b/p 117/69: will continue norvasc 10 mg daily and hydralazine 25 mg twice daily   3. Dysphagia: stable no signs of aspiration present. Will change his feeding to the following: will give one can jevity if he eats less than 50% of his meals  4.  Depression with suicidal ideation: is stable will continue prozac 20 mg daily   5. Seizures: is stable;  No reports of seizure activity: will continue keppra 500 mg twice daily   6. Dyslipidemia: stable will continue lipitor 20 mg daily     Will check bmp     MD is aware of resident's narcotic use and is in agreement with current plan of care. We will attempt to wean resident as apropriate   Synthia Innocenteborah Green NP [facility]  Contact [phone] Monday through Friday 8am- 5pm  After hours call [phone]

## 2017-02-16 ENCOUNTER — Ambulatory Visit: Payer: Medicaid Other | Admitting: Neurology

## 2017-02-22 DIAGNOSIS — G928 Other toxic encephalopathy: Secondary | ICD-10-CM

## 2017-02-22 DIAGNOSIS — G92 Toxic encephalopathy: Secondary | ICD-10-CM

## 2017-03-16 ENCOUNTER — Encounter: Payer: Self-pay | Admitting: Adult Health

## 2017-03-16 ENCOUNTER — Non-Acute Institutional Stay (SKILLED_NURSING_FACILITY): Payer: Medicaid Other | Admitting: Adult Health

## 2017-03-16 DIAGNOSIS — R1310 Dysphagia, unspecified: Secondary | ICD-10-CM

## 2017-03-16 DIAGNOSIS — I10 Essential (primary) hypertension: Secondary | ICD-10-CM

## 2017-03-16 DIAGNOSIS — I6930 Unspecified sequelae of cerebral infarction: Secondary | ICD-10-CM | POA: Diagnosis not present

## 2017-03-16 DIAGNOSIS — I69351 Hemiplegia and hemiparesis following cerebral infarction affecting right dominant side: Secondary | ICD-10-CM | POA: Diagnosis not present

## 2017-03-16 NOTE — Progress Notes (Signed)
Location:   [facility] Room Number: 229 B Place of Service:  SNF (31)   CODE STATUS: Full Code  No Known Allergies  Chief Complaint  Patient presents with  . Medical Management of Chronic Issues    Cva; dysphagia; hypertension;     HPI:  He is a 52 year old long term resident of this facility being seen for the management of his chronic illnesses: cva; dysphagia; hypertension. He is unable to fully participate in the hpi or ros. There are reports of headaches or changes in vision; there are no reports of uncontrolled pain; present. No changes in appetite. There are no nursing concerns at this time.   Past Medical History:  Diagnosis Date  . Arthritis   . Contracture, left hand   . 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 Depression   . Hyperlipidemia   . Hypertension   . Stroke [facility](HCC)     Past Surgical History:  Procedure Laterality Date  . HERNIA REPAIR    . PEG TUBE PLACEMENT  07/01/2016    Social History   Socioeconomic History  . Marital status: Widowed    Spouse name: Not on file  . Number of children: Not on file  . Years of education: Not on file  . Highest education level: Not on file  Social Needs  . Financial resource strain: Not on file  . Food insecurity - worry: Not on file  . Food insecurity - inability: Not on file  . Transportation needs - medical: Not on file  . Transportation needs - non-medical: Not on file  Occupational History  . Not on file  Tobacco Use  . Smoking status: Never Smoker  . Smokeless tobacco: Never Used  Substance and Sexual Activity  . Alcohol use: Not on file  . Drug use: No  . Sexual activity: Not on file  Other Topics Concern  . Not on file  Social History Narrative  . Not on file   Family History  Problem Relation Age of Onset  . Hypertension Other       VITAL SIGNS Ht 6' (1.829 m)   Wt 173 lb (78.5 kg)   BMI 23.46 kg/m   Outpatient Encounter Medications as of 03/16/2017  Medication Sig  . Amino  Acids-Protein Hydrolys (FEEDING SUPPLEMENT, PRO-STAT SUGAR FREE 64,) LIQD Place 30 mLs into feeding tube 2 (two) times daily.  Marland Kitchen. amLODipine (NORVASC) 10 MG tablet Place 10 mg into feeding tube daily.  Marland Kitchen. aspirin 325 MG tablet Place 325 mg into feeding tube daily.  Marland Kitchen. atorvastatin (LIPITOR) 20 MG tablet Place 20 mg into feeding tube at bedtime.   . enoxaparin (LOVENOX) 40 MG/0.4ML injection Inject 0.4 mLs (40 mg total) into the skin daily.  Marland Kitchen. FLUoxetine (PROZAC) 20 MG capsule Place 20 mg into feeding tube daily.  . hydrALAZINE (APRESOLINE) 25 MG tablet Place 25 mg into feeding tube 2 (two) times daily.  Marland Kitchen. levETIRAcetam (KEPPRA) 100 MG/ML solution Place 5 mLs (500 mg total) into feeding tube 2 (two) times daily.  . Nutritional Supplements (FEEDING SUPPLEMENT, JEVITY 1.5 CAL,) LIQD Place 237 mLs into feeding tube. Administer after meals if patient eats less than 50% of meal  . Nutritional Supplements (NUTRITIONAL SUPPLEMENT PO) HSG Regular Diet - HSG Puree texture, regular consistency  . risperiDONE (RISPERDAL) 1 MG tablet Place 1 mg into feeding tube at bedtime.  . tizanidine (ZANAFLEX) 2 MG capsule Place 1 capsule (2 mg total) into feeding tube 2 (two)  times daily.   No facility-administered encounter medications on file as of 03/16/2017.      SIGNIFICANT DIAGNOSTIC EXAMS  PREVIOUS:   12-01-16: chest x-ray: Mild cardiomegaly without pulmonary edema. No acute cardiopulmonary abnormality.  12-01-16: ct of head:  1. No acute intracranial abnormality. 2. Remote cortical stroke involving the left occipital lobe and left superior temporal lobe (left posterior cerebral artery distribution). 3. Remote stroke involving the pons. 4. Severe chronic microvascular ischemic changes involving the white Matter.  12-01-16: ct angio of chest:  1. No evidence of acute pulmonary embolism is seen. 2. Fusiform dilatation of the ascending thoracic aorta measuring up to 41 mm in diameter. Recommend annual imaging  followup by CTA or MRA.  3. Possible small hiatal hernia.  12-02-16: EEG:  EEG Abnormalities: None Clinical Interpretation: This normal EEG is recorded in the waking state. There was no seizure or seizure predisposition recorded on this study. Please note that a normal EEG does not preclude the possibility of epilepsy.    12-02-16: 2-d echo:  - Left ventricle: The cavity size was mildly dilated. Wall motion  was normal; there were no regional wall motion abnormalities. Mildly reduced global EF 50%   Doppler parameters are consistent with abnormal left ventricular relaxation (grade 1 diastolic dysfunction). - No significant valvular abnormality - Mild dilatation of aortic root (4.1 cm) and proximal ascending aorta (3.9 cm)   12-23-16: chest x-ray: There is no pneumonia nor other acute cardiopulmonary abnormality.  12-23-16: ct of head: 1. Multifocal infarcts. Extensive periventricular decreased attenuation, suspect small vessel vascular disease, age advanced, although a degree of superimposed demyelination cannot be excluded. Small vessel disease also noted in the basilar perforator distribution of the mid pons. No acute infarct evident. No intracranial mass, hemorrhage, or extra-axial fluid collection. 2.  Foci of arterial vascular calcification, age advanced. 3.  Presumed cerumen in each external auditory canal.  12-24-16: EEG: This awake EEG is abnormal due to diffuse slowing of the waking background.   12-25-16: MRI: of brain:  1. Motion degraded examination without evidence of acute intracranial abnormality. 2. Chronic left PCA infarct. 3. Extensive chronic small vessel ischemic changes including chronic lacunar infarcts as above. 4. Numerous chronic microhemorrhages in a pattern consistent with chronic hypertension.  02-06-17: chest x-ray: No active disease.   02-06-17: ct of head: No acute intracranial abnormality. Stable left occipital lobe infarct and severe chronic small vessel  disease  02-08-17: chest x-ray: No active disease.   NO NEW EXAMS      LABS REVIEWED; PREVIOUS  10-26-16: wbc 4.6; hgb 13.5; hct 41.7; mcv 91.8; plt 166 glucose 100; bun 17.2; creat 0.72; k+ 4.4 ;na++ 141; liver normal albumin 3.7 hgb a1c 4.5  11-05-16: glucose 85; bun 9.3; creat 0.66; k+ 4.2; na++ 138; ca 9.7 12-01-16: wbc 2.8; hgb 11.1; hct 33.6; mcv 88.2. plt 131; glucose 99; bun 17; creat 1.04; k+ 3.7; na++ 138; ca 8.9; liver normal albumin 3.1 12-03-16: wbc 2.8; hgb 12.1; hct 35.6; mcv 86.0; plt 143glucose 85; bun 7; creat 0.78; k+ 3.5; na++ 139; ca 9.2 12-23-16: wbc 5.4; hgb 12.8; hct 38.3; mcv 87.2; plt 185; glucose 105; bun 10; creat 0.75; k+ 3.3; na++ 137; ca 9.3; ast 13 alt 12; albumin 3.8 12-25-16: wbc 3.1; hgb 11.4; hct 35.6; mcv 89.7; plt 171; glucose 81; bun 14; creat 0.91; k+ 3.5; na++ 140; ca 9.0; ammonia 34 02-06-17: wbc 4.4; hgb 13.6; hct 40.5; mcv 89.8; plt 178; glucose 108; bun 10; creat 0.71; k+ 3.6;  na++138; ca  9.6; liver normal albumin 4.1; urine culture: <10,000 colonies 02-08-17: wbc 4.3; hgb 11.5; hct 35.2; mcv 90.5 ;plt 145; glucose 83; bun 20; creat 0.80; k+ 3.7; na++ 140; ca 9.0; mag 1.7 02-10-17: wbc 2.8; hgb 12.0; hct 35.4; mcv 89.2; plt 148; glucose 86; bun 14; creat 0.59; k+ 3.9; na++ 138; ca 8.7  NO NEW LABS         Review of Systems  Unable to perform ROS: Other (expressive aphasia )   Physical Exam  Constitutional: He appears well-developed and well-nourished. No distress.  Neck: Neck supple. No thyromegaly present.  Cardiovascular: Normal rate, regular rhythm and intact distal pulses.  Murmur heard. 1/6  Pulmonary/Chest: Effort normal and breath sounds normal. No respiratory distress.  Abdominal: Soft. Bowel sounds are normal. He exhibits no distension. There is no tenderness.  Peg tube in place   Musculoskeletal: He exhibits no edema.  Right hemiplegia Fingers contracted    Lymphadenopathy:    He has no cervical adenopathy.  Neurological: He is  alert.  Skin: Skin is warm and dry. He is not diaphoretic.  Psychiatric: He has a normal mood and affect.    ASSESSMENT/ PLAN:  TODAY:   1. 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   2. Essential hypertension: stable : will continue norvasc 10 mg daily and hydralazine 25 mg twice daily   3. Dysphagia: stable no signs of aspiration present. Will change his feeding to the following: will give one can jevity if he eats less than 50% of his meals  PREVIOUS   4.  Depression with suicidal ideation: is stable will continue prozac 20 mg daily   5. Seizures: is stable;  No reports of seizure activity: will continue keppra 500 mg twice daily   6. Dyslipidemia: stable will continue lipitor 20 mg daily       MD is aware of resident's narcotic use and is in agreement with current plan of care. We will attempt to wean resident as apropriate     Synthia Innocent NP [facility]  Contact [phone] Monday through Friday 8am- 5pm  After hours call [phone]

## 2017-03-17 LAB — BASIC METABOLIC PANEL
BUN: 9 (ref 4–21)
Creatinine: 0.7 (ref 0.6–1.3)
GLUCOSE: 88
Potassium: 3.9 (ref 3.4–5.3)
Sodium: 141 (ref 137–147)

## 2017-03-17 LAB — CBC AND DIFFERENTIAL
HEMATOCRIT: 38 — AB (ref 41–53)
Hemoglobin: 13.2 — AB (ref 13.5–17.5)
NEUTROS ABS: 1
Platelets: 159 (ref 150–399)
WBC: 2.6

## 2017-03-22 ENCOUNTER — Ambulatory Visit (INDEPENDENT_AMBULATORY_CARE_PROVIDER_SITE_OTHER): Payer: Medicaid Other | Admitting: Neurology

## 2017-03-22 ENCOUNTER — Encounter: Payer: Self-pay | Admitting: Neurology

## 2017-03-22 VITALS — BP 128/87 | HR 94

## 2017-03-22 DIAGNOSIS — I69351 Hemiplegia and hemiparesis following cerebral infarction affecting right dominant side: Secondary | ICD-10-CM

## 2017-03-22 DIAGNOSIS — M24542 Contracture, left hand: Secondary | ICD-10-CM

## 2017-03-22 NOTE — Progress Notes (Signed)
PATIENT: Jeremy Brennan DOB: 01/23/1966  Chief Complaint  Patient presents with  . Spasticity    He is a resident of [facility] in [place]. He is here for a Botox consultation for his LUE spasticity.  Previous history of CVA.  Marland Kitchen. PCP    Kirt Boysarter, Monica, DO     HISTORICAL  Jeremy Brennan is a 52 year old male, resident of [facility], accompanied by staff member at today's clinical visit, seen in refer by his primary care doctor Kirt BoysCarter, Monica, for evaluation of Botox injection for left upper extremity spasticity, there was also concern of seizure-like activity, initial evaluation was on March 22, 2017.  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  He was noted to have dysarthria, spastic right hemiparesis, also has mild spasticity at left upper extremity, with fixed contraction of bilateral finger flexion.  Complains of right shoulder pain with passive movement, he is already on Keppra 500 mg twice a day  MRI of the brain October 2018 showed chronic left PCA stroke, extensive supratentorium small vessel disease, numerous chronic microhemorrhage consistent with chronic hypertension  Laboratory evaluation January 2019: Normal BMP, WBC 2.8, hemoglobin of 12.0, he is also taking Keppra 500 mg twice a day  REVIEW OF SYSTEMS: Full 14 system review of systems performed and notable only for as above  ALLERGIES: No Known Allergies  HOME MEDICATIONS: Current Outpatient Medications  Medication  Sig Dispense Refill  . Amino Acids-Protein Hydrolys (FEEDING SUPPLEMENT, PRO-STAT SUGAR FREE 64,) LIQD Place 30 mLs into feeding tube 2 (two) times daily. 900 mL 0  . amLODipine (NORVASC) 10 MG tablet Place 10 mg into feeding tube daily.    Marland Kitchen. aspirin 325 MG tablet Place 325 mg into feeding tube daily.    Marland Kitchen. atorvastatin (LIPITOR) 20 MG tablet Place 20 mg into feeding tube at bedtime.     . enoxaparin (LOVENOX) 40 MG/0.4ML injection Inject 0.4 mLs (40 mg total) into the skin daily. 0 Syringe   . FLUoxetine (PROZAC) 20 MG capsule Place 20 mg into feeding tube daily.    . hydrALAZINE (APRESOLINE) 25 MG tablet Place 25 mg into feeding tube 2 (two) times daily.    Marland Kitchen. levETIRAcetam (KEPPRA) 100 MG/ML solution Place 5 mLs (500 mg total) into feeding tube 2 (two) times daily. 473 mL 12  . Nutritional Supplements (FEEDING SUPPLEMENT, JEVITY 1.5 CAL,) LIQD Place 237 mLs into feeding tube. Administer after meals if patient eats less than 50% of meal    . Nutritional Supplements (NUTRITIONAL SUPPLEMENT PO) HSG Regular Diet - HSG Puree texture, regular consistency    . risperiDONE (RISPERDAL) 1 MG tablet Place 1 mg into feeding tube at bedtime.    . tizanidine (ZANAFLEX) 2 MG capsule Place 1 capsule (2 mg total) into feeding tube 2 (two) times daily. 20 capsule 0   No current facility-administered medications for this visit.     PAST MEDICAL HISTORY: Past Medical History:  Diagnosis Date  . Arthritis   . Contracture, left hand   . 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 Depression   . Hyperlipidemia   . Hypertension   . Spasticity   . Stroke [facility])     PAST SURGICAL HISTORY: Past Surgical History:  Procedure Laterality Date  . HERNIA REPAIR    . PEG TUBE PLACEMENT  07/01/2016    FAMILY HISTORY: Family History  Problem Relation Age of Onset  . Hypertension Other     SOCIAL HISTORY:  Social History   Socioeconomic History  . Marital status: Widowed    Spouse name: Not on file  . Number of  children: Not on file  . Years of education: Not on file  . Highest education level: Not on file  Social Needs  . Financial resource strain: Not on file  . Food insecurity - worry: Not on file  . Food insecurity - inability: Not on file  . Transportation needs - medical: Not on file  . Transportation needs - non-medical: Not on file  Occupational History  . Occupation: Disabled  Tobacco Use  . Smoking status: Never Smoker  . Smokeless tobacco: Never Used  Substance and Sexual Activity  . Alcohol use: No    Frequency: Never  . Drug use: No  . Sexual activity: Not on file  Other Topics Concern  . Not on file  Social History Narrative   Resides at [facility].   Left-handed.   No caffeine use.     PHYSICAL EXAM   Vitals:   03/22/17 1046  BP: 128/87  Pulse: 94    Not recorded      There is no height or weight on file to calculate BMI.  PHYSICAL EXAMNIATION:  Gen: NAD, conversant, well nourised, obese, well groomed                     Cardiovascular: Regular rate rhythm, no peripheral edema, warm, nontender. Eyes: Conjunctivae clear without exudates or hemorrhage Neck: Supple, no carotid bruits. Pulmonary: Clear to auscultation bilaterally   NEUROLOGICAL EXAM:  MENTAL STATUS: He is sitting in wheelchair, dysarthria, not oriented to time and place,   CRANIAL NERVES: CN II: Pupils are round equal and briskly reactive to light. CN III, IV, VI: Smooth pursuit extraocular movement are broken up into small catch-up saccadic eye movement CN V: Facial sensation is intact to pinprick in all 3 divisions bilaterally. Corneal responses are intact.  CN VII: Face is symmetric with normal eye closure and smile. CN VIII: Hearing is normal to rubbing fingers CN IX, X: Palate elevates symmetrically. Phonation is normal. CN XI: Head turning and shoulder shrug are intact CN XII: Tongue is midline with normal movements and no atrophy.  MOTOR: Have spontaneous  activity of left upper and lower extremity, hold right hand in wrist flexion, finger flexion, complains of right shoulder pain elbow pain with passive movement, maximum right shoulder abduction is 80 degrees, limited range of motion of right elbow, 160 degrees, fixed right finger flexion, he has relative free range of motion of left shoulder, left elbow, but tends to hold left hand and fixed position, significant bilateral lower extremity spasticity, right worse than left, antigravity movement,  REFLEXES: Hyperactive all 4 extremity  SENSORY: Withdraws to pain COORDINATION: Dysmetria on the left finger to nose GAIT/STANCE: He needs 2 people assistance to get up from seated position, difficulty initiate gait, very unsteady.   DIAGNOSTIC DATA (LABS, IMAGING, TESTING) - I reviewed patient records, labs, notes, testing and imaging myself where available.   ASSESSMENT AND  PLAN  GIANMARCO ROYE is a 52 y.o. male   History of stroke, with spastic right hemiparesis, also spastic left upper extremity, gait abnormality Reported history of seizure  Keep Keppra 500 mg twice daily  Preauthorization for Botox 500 mg, return to clinic in 3-4 weeks for EMG guided Botox injection for upper extremity spasticity  Levert Feinstein, M.D. Ph.D.  [facility] [address], [address] [place], [place] [address] Ph: [phone] Fax: [phone]  ZH:[id number], [place], [place]

## 2017-04-01 ENCOUNTER — Encounter: Payer: Self-pay | Admitting: Adult Health

## 2017-04-01 ENCOUNTER — Non-Acute Institutional Stay (SKILLED_NURSING_FACILITY): Payer: Medicaid Other | Admitting: Adult Health

## 2017-04-01 DIAGNOSIS — Z20828 Contact with and (suspected) exposure to other viral communicable diseases: Secondary | ICD-10-CM | POA: Diagnosis not present

## 2017-04-01 NOTE — Progress Notes (Signed)
Location:   [facility] Room Number: 229 B Place of Service:  SNF (31)   CODE STATUS: Full Code  No Known Allergies  Chief Complaint  Patient presents with  . Acute Visit    Flu Like symptoms    HPI:  There is currently an outbreak of influenza A on this unit. There are no reports of fever; body aches or cough.  There are no nursing concerns at this time. He is unable to fully participate in the hpi or ros.    Past Medical History:  Diagnosis Date  . Arthritis   . Contracture, left hand   . 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 Depression   . Hyperlipidemia   . Hypertension   . Spasticity   . Stroke [facility])     Past Surgical History:  Procedure Laterality Date  . HERNIA REPAIR    . PEG TUBE PLACEMENT  07/01/2016    Social History   Socioeconomic History  . Marital status: Widowed    Spouse name: Not on file  . Number of children: Not on file  . Years of education: Not on file  . Highest education level: Not on file  Social Needs  . Financial resource strain: Not on file  . Food insecurity - worry: Not on file  . Food insecurity - inability: Not on file  . Transportation needs - medical: Not on file  . Transportation needs - non-medical: Not on file  Occupational History  . Occupation: Disabled  Tobacco Use  . Smoking status: Never Smoker  . Smokeless tobacco: Never Used  Substance and Sexual Activity  . Alcohol use: No    Frequency: Never  . Drug use: No  . Sexual activity: Not on file  Other Topics Concern  . Not on file  Social History Narrative   Resides at [facility].   Left-handed.   No caffeine use.   Family History  Problem Relation Age of Onset  . Hypertension Other       VITAL SIGNS BP 117/69   Pulse 69   Temp 98 F (36.7 C)   Resp 12   Ht 6' (1.829 m)   Wt 175 lb (79.4 kg)   SpO2 94%   BMI 23.73 kg/m   Outpatient Encounter Medications as of 04/01/2017  Medication Sig  . Amino Acids-Protein  Hydrolys (FEEDING SUPPLEMENT, PRO-STAT SUGAR FREE 64,) LIQD Place 30 mLs into feeding tube 2 (two) times daily.  Marland Kitchen amLODipine (NORVASC) 10 MG tablet Place 10 mg into feeding tube daily.  Marland Kitchen aspirin 325 MG tablet Place 325 mg into feeding tube daily.  Marland Kitchen atorvastatin (LIPITOR) 20 MG tablet Place 20 mg into feeding tube at bedtime.   . enoxaparin (LOVENOX) 40 MG/0.4ML injection Inject 0.4 mLs (40 mg total) into the skin daily.  Marland Kitchen FLUoxetine (PROZAC) 20 MG capsule Place 20 mg into feeding tube daily.  . hydrALAZINE (APRESOLINE) 25 MG tablet Place 25 mg into feeding tube 2 (two) times daily.  Marland Kitchen levETIRAcetam (KEPPRA) 100 MG/ML solution Place 5 mLs (500 mg total) into feeding tube 2 (two) times daily.  . Nutritional Supplements (FEEDING SUPPLEMENT, JEVITY 1.5 CAL,) LIQD Place 237 mLs into feeding tube. Administer after meals if patient eats less than 50% of meal  . Nutritional Supplements (NUTRITIONAL SUPPLEMENT PO) HSG Regular Diet - HSG Puree texture, regular consistency  . risperiDONE (RISPERDAL) 1 MG tablet Place 1 mg into feeding tube at bedtime.  . tizanidine (ZANAFLEX)  2 MG capsule Place 1 capsule (2 mg total) into feeding tube 2 (two) times daily.   No facility-administered encounter medications on file as of 04/01/2017.      SIGNIFICANT DIAGNOSTIC EXAMS  PREVIOUS:   12-01-16: chest x-ray: Mild cardiomegaly without pulmonary edema. No acute cardiopulmonary abnormality.  12-01-16: ct of head:  1. No acute intracranial abnormality. 2. Remote cortical stroke involving the left occipital lobe and left superior temporal lobe (left posterior cerebral artery distribution). 3. Remote stroke involving the pons. 4. Severe chronic microvascular ischemic changes involving the white Matter.  12-01-16: ct angio of chest:  1. No evidence of acute pulmonary embolism is seen. 2. Fusiform dilatation of the ascending thoracic aorta measuring up to 41 mm in diameter. Recommend annual imaging followup by CTA  or MRA.  3. Possible small hiatal hernia.  12-02-16: EEG:  EEG Abnormalities: None Clinical Interpretation: This normal EEG is recorded in the waking state. There was no seizure or seizure predisposition recorded on this study. Please note that a normal EEG does not preclude the possibility of epilepsy.    12-02-16: 2-d echo:  - Left ventricle: The cavity size was mildly dilated. Wall motion  was normal; there were no regional wall motion abnormalities. Mildly reduced global EF 50%   Doppler parameters are consistent with abnormal left ventricular relaxation (grade 1 diastolic dysfunction). - No significant valvular abnormality - Mild dilatation of aortic root (4.1 cm) and proximal ascending aorta (3.9 cm)   12-23-16: chest x-ray: There is no pneumonia nor other acute cardiopulmonary abnormality.  12-23-16: ct of head: 1. Multifocal infarcts. Extensive periventricular decreased attenuation, suspect small vessel vascular disease, age advanced, although a degree of superimposed demyelination cannot be excluded. Small vessel disease also noted in the basilar perforator distribution of the mid pons. No acute infarct evident. No intracranial mass, hemorrhage, or extra-axial fluid collection. 2.  Foci of arterial vascular calcification, age advanced. 3.  Presumed cerumen in each external auditory canal.  12-24-16: EEG: This awake EEG is abnormal due to diffuse slowing of the waking background.   12-25-16: MRI: of brain:  1. Motion degraded examination without evidence of acute intracranial abnormality. 2. Chronic left PCA infarct. 3. Extensive chronic small vessel ischemic changes including chronic lacunar infarcts as above. 4. Numerous chronic microhemorrhages in a pattern consistent with chronic hypertension.  02-06-17: chest x-ray: No active disease.   02-06-17: ct of head: No acute intracranial abnormality. Stable left occipital lobe infarct and severe chronic small vessel  disease  02-08-17: chest x-ray: No active disease.   NO NEW EXAMS      LABS REVIEWED; PREVIOUS  10-26-16: wbc 4.6; hgb 13.5; hct 41.7; mcv 91.8; plt 166 glucose 100; bun 17.2; creat 0.72; k+ 4.4 ;na++ 141; liver normal albumin 3.7 hgb a1c 4.5  11-05-16: glucose 85; bun 9.3; creat 0.66; k+ 4.2; na++ 138; ca 9.7 12-01-16: wbc 2.8; hgb 11.1; hct 33.6; mcv 88.2. plt 131; glucose 99; bun 17; creat 1.04; k+ 3.7; na++ 138; ca 8.9; liver normal albumin 3.1 12-03-16: wbc 2.8; hgb 12.1; hct 35.6; mcv 86.0; plt 143glucose 85; bun 7; creat 0.78; k+ 3.5; na++ 139; ca 9.2 12-23-16: wbc 5.4; hgb 12.8; hct 38.3; mcv 87.2; plt 185; glucose 105; bun 10; creat 0.75; k+ 3.3; na++ 137; ca 9.3; ast 13 alt 12; albumin 3.8 12-25-16: wbc 3.1; hgb 11.4; hct 35.6; mcv 89.7; plt 171; glucose 81; bun 14; creat 0.91; k+ 3.5; na++ 140; ca 9.0; ammonia 34 02-06-17: wbc 4.4; hgb 13.6;  hct 40.5; mcv 89.8; plt 178; glucose 108; bun 10; creat 0.71; k+ 3.6; na++138; ca  9.6; liver normal albumin 4.1; urine culture: <10,000 colonies 02-08-17: wbc 4.3; hgb 11.5; hct 35.2; mcv 90.5 ;plt 145; glucose 83; bun 20; creat 0.80; k+ 3.7; na++ 140; ca 9.0; mag 1.7 02-10-17: wbc 2.8; hgb 12.0; hct 35.4; mcv 89.2; plt 148; glucose 86; bun 14; creat 0.59; k+ 3.9; na++ 138; ca 8.7  NO NEW LABS        Review of Systems  Unable to perform ROS: Other (expressive aphasia )    Physical Exam  Constitutional: He appears well-developed and well-nourished. No distress.  Neck: No thyromegaly present.  Cardiovascular: Normal rate, regular rhythm and intact distal pulses.  Murmur heard. 1/6  Pulmonary/Chest: Effort normal and breath sounds normal. No respiratory distress.  Abdominal: Soft. Bowel sounds are normal. He exhibits no distension. There is no tenderness.  Peg tube present   Musculoskeletal: He exhibits no edema.  Right hemiplegia Fingers contracted     Lymphadenopathy:    He has no cervical adenopathy.  Neurological: He is alert.  Skin:  Skin is warm and dry. He is not diaphoretic.  Psychiatric: He has a normal mood and affect.    ASSESSMENT/ PLAN:  TODAY:   1.  Exposure to influenza: will begin tamiflu 75 mg daily for 14 days and will monitor    MD is aware of resident's narcotic use and is in agreement with current plan of care. We will attempt to wean resident as apropriate     Synthia Innocent NP [facility]  Contact [phone] Monday through Friday 8am- 5pm  After hours call [phone]

## 2017-04-08 ENCOUNTER — Non-Acute Institutional Stay (SKILLED_NURSING_FACILITY): Payer: Medicaid Other | Admitting: Internal Medicine

## 2017-04-08 DIAGNOSIS — I69351 Hemiplegia and hemiparesis following cerebral infarction affecting right dominant side: Secondary | ICD-10-CM | POA: Diagnosis not present

## 2017-04-08 DIAGNOSIS — M7989 Other specified soft tissue disorders: Secondary | ICD-10-CM

## 2017-04-19 ENCOUNTER — Non-Acute Institutional Stay (SKILLED_NURSING_FACILITY): Payer: Medicaid Other | Admitting: Adult Health

## 2017-04-19 ENCOUNTER — Encounter: Payer: Self-pay | Admitting: Adult Health

## 2017-04-19 DIAGNOSIS — E782 Mixed hyperlipidemia: Secondary | ICD-10-CM | POA: Diagnosis not present

## 2017-04-19 DIAGNOSIS — F0150 Vascular dementia without behavioral disturbance: Secondary | ICD-10-CM

## 2017-04-19 DIAGNOSIS — R569 Unspecified convulsions: Secondary | ICD-10-CM | POA: Diagnosis not present

## 2017-04-19 DIAGNOSIS — R45851 Suicidal ideations: Secondary | ICD-10-CM | POA: Diagnosis not present

## 2017-04-19 DIAGNOSIS — F329 Major depressive disorder, single episode, unspecified: Secondary | ICD-10-CM

## 2017-04-19 DIAGNOSIS — Z931 Gastrostomy status: Secondary | ICD-10-CM | POA: Diagnosis not present

## 2017-04-19 HISTORY — DX: Vascular dementia, unspecified severity, without behavioral disturbance, psychotic disturbance, mood disturbance, and anxiety: F01.50

## 2017-04-19 NOTE — Progress Notes (Signed)
[facility]:   [facility] Room Number: 229 Place of Service:  SNF (31)   CODE STATUS: full code   No Known Allergies  Chief Complaint  Patient presents with  . Medical Management of Chronic Issues    Dementia; hyperlipidemia; depression; seizure;     HPI:  He is a 52 year old long term resident of this facility being seen for the management of his chronic illnesses: dementia; hyperlipidemia; depression; seizure. He is unable to fully participate in the hpi or ros. There are no reports of seizure activity; changes in appetite; no behavioral issues present. There are no nursing concerns at this time.   Past Medical History:  Diagnosis Date  . Arthritis   . Contracture, left hand   . 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 Depression   . Hyperlipidemia   . Hypertension   . Spasticity   . Stroke [facility](HCC)     Past Surgical History:  Procedure Laterality Date  . HERNIA REPAIR    . PEG TUBE PLACEMENT  07/01/2016    Social History   Socioeconomic History  . Marital status: Widowed    Spouse name: Not on file  . Number of children: Not on file  . Years of education: Not on file  . Highest education level: Not on file  Social Needs  . Financial resource strain: Not on file  . Food insecurity - worry: Not on file  . Food insecurity - inability: Not on file  . Transportation needs - medical: Not on file  . Transportation needs - non-medical: Not on file  Occupational History  . Occupation: Disabled  Tobacco Use  . Smoking status: Never Smoker  . Smokeless tobacco: Never Used  Substance and Sexual Activity  . Alcohol use: No    Frequency: Never  . Drug use: No  . Sexual activity: Not on file  Other Topics Concern  . Not on file  Social History Narrative   [facility] at [facility].   Left-handed.   No caffeine use.   Family History  Problem Relation Age of Onset  . Hypertension Other       VITAL SIGNS BP 117/69   Pulse 69   Temp 98  F (36.7 C)   Resp 12   Ht 6' (1.829 m)   Wt 175 lb (79.4 kg)   SpO2 94%   BMI 23.73 kg/m   Outpatient Encounter Medications as of 04/19/2017  Medication Sig  . Amino Acids-Protein Hydrolys (FEEDING SUPPLEMENT, PRO-STAT SUGAR FREE 64,) LIQD Place 30 mLs into feeding tube 2 (two) times daily.  Marland Kitchen. amLODipine (NORVASC) 10 MG tablet Place 10 mg into feeding tube daily.  Marland Kitchen. aspirin 325 MG tablet Place 325 mg into feeding tube daily.  Marland Kitchen. atorvastatin (LIPITOR) 20 MG tablet Place 20 mg into feeding tube at bedtime.   . enoxaparin (LOVENOX) 40 MG/0.4ML injection Inject 0.4 mLs (40 mg total) into the skin daily.  Marland Kitchen. FLUoxetine (PROZAC) 20 MG capsule Place 20 mg into feeding tube daily.  . hydrALAZINE (APRESOLINE) 25 MG tablet Place 25 mg into feeding tube 2 (two) times daily.  Marland Kitchen. levETIRAcetam (KEPPRA) 100 MG/ML solution Place 5 mLs (500 mg total) into feeding tube 2 (two) times daily.  . Nutritional Supplements (FEEDING SUPPLEMENT, JEVITY 1.5 CAL,) LIQD Place 237 mLs into feeding tube. Administer after meals if patient eats less than 50% of meal  . Nutritional Supplements (NUTRITIONAL SUPPLEMENT PO) HSG Regular Diet -  HSG Puree texture, regular consistency  . risperiDONE (RISPERDAL) 1 MG tablet Place 1 mg into feeding tube at bedtime.  . tizanidine (ZANAFLEX) 2 MG capsule Place 1 capsule (2 mg total) into feeding tube 2 (two) times daily.   No facility-administered encounter medications on file as of 04/19/2017.      SIGNIFICANT DIAGNOSTIC EXAMS  PREVIOUS:   12-01-16: chest x-ray: Mild cardiomegaly without pulmonary edema. No acute cardiopulmonary abnormality.  12-01-16: ct of head:  1. No acute intracranial abnormality. 2. Remote cortical stroke involving the left occipital lobe and left superior temporal lobe (left posterior cerebral artery distribution). 3. Remote stroke involving the pons. 4. Severe chronic microvascular ischemic changes involving the white Matter.  12-01-16: ct angio of  chest:  1. No evidence of acute pulmonary embolism is seen. 2. Fusiform dilatation of the ascending thoracic aorta measuring up to 41 mm in diameter. Recommend annual imaging followup by CTA or MRA.  3. Possible small hiatal hernia.  12-02-16: EEG:  EEG Abnormalities: None Clinical Interpretation: This normal EEG is recorded in the waking state. There was no seizure or seizure predisposition recorded on this study. Please note that a normal EEG does not preclude the possibility of epilepsy.    12-02-16: 2-d echo:  - Left ventricle: The cavity size was mildly dilated. Wall motion  was normal; there were no regional wall motion abnormalities. Mildly reduced global EF 50%   Doppler parameters are consistent with abnormal left ventricular relaxation (grade 1 diastolic dysfunction). - No significant valvular abnormality - Mild dilatation of aortic root (4.1 cm) and proximal ascending aorta (3.9 cm)   12-23-16: chest x-ray: There is no pneumonia nor other acute cardiopulmonary abnormality.  12-23-16: ct of head: 1. Multifocal infarcts. Extensive periventricular decreased attenuation, suspect small vessel vascular disease, age advanced, although a degree of superimposed demyelination cannot be excluded. Small vessel disease also noted in the basilar perforator distribution of the mid pons. No acute infarct evident. No intracranial mass, hemorrhage, or extra-axial fluid collection. 2.  Foci of arterial vascular calcification, age advanced. 3.  Presumed cerumen in each external auditory canal.  12-24-16: EEG: This awake EEG is abnormal due to diffuse slowing of the waking background.   12-25-16: MRI: of brain:  1. Motion degraded examination without evidence of acute intracranial abnormality. 2. Chronic left PCA infarct. 3. Extensive chronic small vessel ischemic changes including chronic lacunar infarcts as above. 4. Numerous chronic microhemorrhages in a pattern consistent with chronic  hypertension.  02-06-17: chest x-ray: No active disease.   02-06-17: ct of head: No acute intracranial abnormality. Stable left occipital lobe infarct and severe chronic small vessel disease  02-08-17: chest x-ray: No active disease.   NO NEW EXAMS      LABS REVIEWED; PREVIOUS  10-26-16: wbc 4.6; hgb 13.5; hct 41.7; mcv 91.8; plt 166 glucose 100; bun 17.2; creat 0.72; k+ 4.4 ;na++ 141; liver normal albumin 3.7 hgb a1c 4.5  11-05-16: glucose 85; bun 9.3; creat 0.66; k+ 4.2; na++ 138; ca 9.7 12-01-16: wbc 2.8; hgb 11.1; hct 33.6; mcv 88.2. plt 131; glucose 99; bun 17; creat 1.04; k+ 3.7; na++ 138; ca 8.9; liver normal albumin 3.1 12-03-16: wbc 2.8; hgb 12.1; hct 35.6; mcv 86.0; plt 143glucose 85; bun 7; creat 0.78; k+ 3.5; na++ 139; ca 9.2 12-23-16: wbc 5.4; hgb 12.8; hct 38.3; mcv 87.2; plt 185; glucose 105; bun 10; creat 0.75; k+ 3.3; na++ 137; ca 9.3; ast 13 alt 12; albumin 3.8 12-25-16: wbc 3.1; hgb 11.4; hct  35.6; mcv 89.7; plt 171; glucose 81; bun 14; creat 0.91; k+ 3.5; na++ 140; ca 9.0; ammonia 34 02-06-17: wbc 4.4; hgb 13.6; hct 40.5; mcv 89.8; plt 178; glucose 108; bun 10; creat 0.71; k+ 3.6; na++138; ca  9.6; liver normal albumin 4.1; urine culture: <10,000 colonies 02-08-17: wbc 4.3; hgb 11.5; hct 35.2; mcv 90.5 ;plt 145; glucose 83; bun 20; creat 0.80; k+ 3.7; na++ 140; ca 9.0; mag 1.7 02-10-17: wbc 2.8; hgb 12.0; hct 35.4; mcv 89.2; plt 148; glucose 86; bun 14; creat 0.59; k+ 3.9; na++ 138; ca 8.7  NO NEW LABS         Review of Systems  Unable to perform ROS: Other (expressive aphasia )    Physical Exam  Constitutional: He appears well-developed and well-nourished. No distress.  Neck: No thyromegaly present.  Cardiovascular: Normal rate, regular rhythm and intact distal pulses.  Murmur heard. 1/6  Pulmonary/Chest: Effort normal and breath sounds normal. No respiratory distress.  Abdominal: Soft. Bowel sounds are normal. He exhibits no distension. There is no tenderness.  Peg  tube present without signs of infection present   Musculoskeletal: He exhibits no edema.  Right hemiplegia Fingers contracted    Lymphadenopathy:    He has no cervical adenopathy.  Neurological: He is alert.  Skin: Skin is warm and dry. He is not diaphoretic.  Psychiatric: He has a normal mood and affect.   ASSESSMENT/ PLAN:  TODAY:   1.  Depression with suicidal ideation: is stable will continue prozac 20 mg daily and is taking riperdal 1 mg nightly   2. Seizures: is stable;  No reports of seizure activity: will continue keppra 500 mg twice daily   3. Mixed hyperlipidemia: stable will continue lipitor 20 mg daily   4. Vascular dementia without behavioral disturbance: is without change; his current weight is 175 pounds; is presently not taking medications will not make changes will monitor his status.    PREVIOUS   5. 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   6. Essential hypertension: stable : b/p 117/69 will continue norvasc 10 mg daily and hydralazine 25 mg twice daily   7. Dysphagia: stable no signs of aspiration present. He requires tube feeding in order to maintain his nutritional status.  Will continue one can jevity if he eats less than 50% of his meals   Will check lipids; hgb a1c Will stop prostat and lovenox   MD is aware of resident's narcotic use and is in agreement with current plan of care. We will attempt to wean resident as apropriate   Synthia Innocent NP [facility]  Contact [phone] Monday through Friday 8am- 5pm  After hours call [phone]

## 2017-04-28 ENCOUNTER — Ambulatory Visit: Payer: Medicaid Other | Admitting: Neurology

## 2017-05-17 ENCOUNTER — Non-Acute Institutional Stay: Payer: Medicaid Other | Admitting: Adult Health

## 2017-05-17 ENCOUNTER — Encounter: Payer: Self-pay | Admitting: Adult Health

## 2017-05-17 DIAGNOSIS — I10 Essential (primary) hypertension: Secondary | ICD-10-CM

## 2017-05-17 DIAGNOSIS — I69391 Dysphagia following cerebral infarction: Secondary | ICD-10-CM

## 2017-05-17 DIAGNOSIS — I6930 Unspecified sequelae of cerebral infarction: Secondary | ICD-10-CM

## 2017-05-17 NOTE — Progress Notes (Signed)
Location:   [facility] Room Number: 229 B Place of Service:  SNF (31)   CODE STATUS: Full Code  No Known Allergies  Chief Complaint  Patient presents with  . Medical Management of Chronic Issues    Cva; dysphagia; hypertension    HPI:  He is a 52 year old long term resident of this facility being seen for the management of his chronic illnesses; cva; dysphagia; hypertension. He is unable to participate in the hpi or ros. There are no reports of aspiration; no headaches; no reports of behavioral issues. There are no nursing concerns at this time.   Past Medical History:  Diagnosis Date  . Arthritis   . Contracture, left hand   . 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 Depression   . Hyperlipidemia   . Hypertension   . Spasticity   . Stroke (HCC)   . Sudden cardiac arrest (HCC) 12/01/2016    Past Surgical History:  Procedure Laterality Date  . HERNIA REPAIR    . PEG TUBE PLACEMENT  07/01/2016    Social History   Socioeconomic History  . Marital status: Widowed    Spouse name: Not on file  . Number of children: Not on file  . Years of education: Not on file  . Highest education level: Not on file  Social Needs  . Financial resource strain: Not on file  . Food insecurity - worry: Not on file  . Food insecurity - inability: Not on file  . Transportation needs - medical: Not on file  . Transportation needs - non-medical: Not on file  Occupational History  . Occupation: Disabled  Tobacco Use  . Smoking status: Never Smoker  . Smokeless tobacco: Never Used  Substance and Sexual Activity  . Alcohol use: No    Frequency: Never  . Drug use: No  . Sexual activity: Not on file  Other Topics Concern  . Not on file  Social History Narrative   Resides at [facility].   No caffeine use.   Family History  Problem Relation Age of Onset  . Hypertension Other       VITAL SIGNS BP 115/76   Pulse 86   Temp 97.8 F (36.6 C)   Resp  18   Ht 6' (1.829 m)   Wt 170 lb 3.2 oz (77.2 kg)   SpO2 98%   BMI 23.08 kg/m   Outpatient Encounter Medications as of 05/17/2017  Medication Sig  . amLODipine (NORVASC) 10 MG tablet Place 10 mg into feeding tube daily.  Marland Kitchen aspirin 325 MG tablet Place 325 mg into feeding tube daily.  Marland Kitchen atorvastatin (LIPITOR) 20 MG tablet Place 20 mg into feeding tube at bedtime.   Marland Kitchen FLUoxetine (PROZAC) 20 MG capsule Place 20 mg into feeding tube daily.  . hydrALAZINE (APRESOLINE) 25 MG tablet Place 25 mg into feeding tube 2 (two) times daily.  Marland Kitchen levETIRAcetam (KEPPRA) 100 MG/ML solution Place 5 mLs (500 mg total) into feeding tube 2 (two) times daily.  . Nutritional Supplements (FEEDING SUPPLEMENT, JEVITY 1.5 CAL,) LIQD Place 237 mLs into feeding tube. Administer after meals if patient eats less than 50% of meal  . Nutritional Supplements (NUTRITIONAL SUPPLEMENT PO) HSG Regular Diet - HSG Mech Soft texture, regular consistency  . risperiDONE (RISPERDAL) 1 MG tablet Place 1 mg into feeding tube at bedtime.  . tizanidine (ZANAFLEX) 2 MG capsule Place 1 capsule (2 mg total) into feeding tube 2 (two)  times daily.  . Amino Acids-Protein Hydrolys (FEEDING SUPPLEMENT, PRO-STAT SUGAR FREE 64,) LIQD Place 30 mLs into feeding tube 2 (two) times daily.  . [DISCONTINUED] enoxaparin (LOVENOX) 40 MG/0.4ML injection Inject 0.4 mLs (40 mg total) into the skin daily. (Patient not taking: Reported on 05/17/2017)   No facility-administered encounter medications on file as of 05/17/2017.      SIGNIFICANT DIAGNOSTIC EXAMS  PREVIOUS:   12-01-16: chest x-ray: Mild cardiomegaly without pulmonary edema. No acute cardiopulmonary abnormality.  12-01-16: ct of head:  1. No acute intracranial abnormality. 2. Remote cortical stroke involving the left occipital lobe and left superior temporal lobe (left posterior cerebral artery distribution). 3. Remote stroke involving the pons. 4. Severe chronic microvascular ischemic changes  involving the white Matter.  12-01-16: ct angio of chest:  1. No evidence of acute pulmonary embolism is seen. 2. Fusiform dilatation of the ascending thoracic aorta measuring up to 41 mm in diameter. Recommend annual imaging followup by CTA or MRA.  3. Possible small hiatal hernia.  12-02-16: EEG:  EEG Abnormalities: None Clinical Interpretation: This normal EEG is recorded in the waking state. There was no seizure or seizure predisposition recorded on this study. Please note that a normal EEG does not preclude the possibility of epilepsy.    12-02-16: 2-d echo:  - Left ventricle: The cavity size was mildly dilated. Wall motion  was normal; there were no regional wall motion abnormalities. Mildly reduced global EF 50%   Doppler parameters are consistent with abnormal left ventricular relaxation (grade 1 diastolic dysfunction). - No significant valvular abnormality - Mild dilatation of aortic root (4.1 cm) and proximal ascending aorta (3.9 cm)   12-23-16: chest x-ray: There is no pneumonia nor other acute cardiopulmonary abnormality.  12-23-16: ct of head: 1. Multifocal infarcts. Extensive periventricular decreased attenuation, suspect small vessel vascular disease, age advanced, although a degree of superimposed demyelination cannot be excluded. Small vessel disease also noted in the basilar perforator distribution of the mid pons. No acute infarct evident. No intracranial mass, hemorrhage, or extra-axial fluid collection. 2.  Foci of arterial vascular calcification, age advanced. 3.  Presumed cerumen in each external auditory canal.  12-24-16: EEG: This awake EEG is abnormal due to diffuse slowing of the waking background.   12-25-16: MRI: of brain:  1. Motion degraded examination without evidence of acute intracranial abnormality. 2. Chronic left PCA infarct. 3. Extensive chronic small vessel ischemic changes including chronic lacunar infarcts as above. 4. Numerous chronic  microhemorrhages in a pattern consistent with chronic hypertension.  02-06-17: chest x-ray: No active disease.   02-06-17: ct of head: No acute intracranial abnormality. Stable left occipital lobe infarct and severe chronic small vessel disease  02-08-17: chest x-ray: No active disease.   NO NEW EXAMS      LABS REVIEWED; PREVIOUS  10-26-16: wbc 4.6; hgb 13.5; hct 41.7; mcv 91.8; plt 166 glucose 100; bun 17.2; creat 0.72; k+ 4.4 ;na++ 141; liver normal albumin 3.7 hgb a1c 4.5  11-05-16: glucose 85; bun 9.3; creat 0.66; k+ 4.2; na++ 138; ca 9.7 12-01-16: wbc 2.8; hgb 11.1; hct 33.6; mcv 88.2. plt 131; glucose 99; bun 17; creat 1.04; k+ 3.7; na++ 138; ca 8.9; liver normal albumin 3.1 12-03-16: wbc 2.8; hgb 12.1; hct 35.6; mcv 86.0; plt 143glucose 85; bun 7; creat 0.78; k+ 3.5; na++ 139; ca 9.2 12-23-16: wbc 5.4; hgb 12.8; hct 38.3; mcv 87.2; plt 185; glucose 105; bun 10; creat 0.75; k+ 3.3; na++ 137; ca 9.3; ast 13 alt 12;  albumin 3.8 12-25-16: wbc 3.1; hgb 11.4; hct 35.6; mcv 89.7; plt 171; glucose 81; bun 14; creat 0.91; k+ 3.5; na++ 140; ca 9.0; ammonia 34 02-06-17: wbc 4.4; hgb 13.6; hct 40.5; mcv 89.8; plt 178; glucose 108; bun 10; creat 0.71; k+ 3.6; na++138; ca  9.6; liver normal albumin 4.1; urine culture: <10,000 colonies 02-08-17: wbc 4.3; hgb 11.5; hct 35.2; mcv 90.5 ;plt 145; glucose 83; bun 20; creat 0.80; k+ 3.7; na++ 140; ca 9.0; mag 1.7 02-10-17: wbc 2.8; hgb 12.0; hct 35.4; mcv 89.2; plt 148; glucose 86; bun 14; creat 0.59; k+ 3.9; na++ 138; ca 8.7  NO NEW LABS         Review of Systems  Unable to perform ROS: Other (expressive aphasia )    Physical Exam  Constitutional: He appears well-developed and well-nourished. No distress.  Neck: No thyromegaly present.  Cardiovascular: Regular rhythm and intact distal pulses.  Murmur heard. 1/6  Pulmonary/Chest: Effort normal and breath sounds normal. No respiratory distress.  Abdominal: Soft. Bowel sounds are normal. He exhibits no  distension. There is no tenderness.  Peg tube present without signs of infection present   Musculoskeletal: He exhibits no edema.  Right hemiplegia   Lymphadenopathy:    He has no cervical adenopathy.  Neurological: He is alert.  Skin: Skin is warm and dry. He is not diaphoretic.  Psychiatric: He has a normal mood and affect.     ASSESSMENT/ PLAN:  TODAY:   1. 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   2. Essential hypertension: stable : b/p 115/76 will continue norvasc 10 mg daily and hydralazine 25 mg twice daily   3. Dysphagia due to recent cerebrovascular accident: stable no signs of aspiration present. He requires tube feeding in order to maintain his nutritional status.  Will continue one can jevity if he eats less than 50% of his meals  PREVIOUS   4.  Depression with suicidal ideation: is stable will continue prozac 20 mg daily and is taking riperdal 1 mg nightly   5. Seizures: is stable;  No reports of seizure activity: will continue keppra 500 mg twice daily   6. Mixed hyperlipidemia: stable will continue lipitor 20 mg daily   7. Vascular dementia without behavioral disturbance: is without change; his current weight is 170 pounds; is presently not taking medications will not make changes will monitor his status.      MD is aware of resident's narcotic use and is in agreement with current plan of care. We will attempt to wean resident as apropriate    Synthia Innocenteborah Green NP [facility]  Contact [phone] Monday through Friday 8am- 5pm  After hours call [phone]

## 2017-05-26 ENCOUNTER — Encounter: Payer: Self-pay | Admitting: Adult Health

## 2017-05-26 ENCOUNTER — Non-Acute Institutional Stay (SKILLED_NURSING_FACILITY): Payer: Medicaid Other | Admitting: Adult Health

## 2017-05-26 DIAGNOSIS — I69391 Dysphagia following cerebral infarction: Secondary | ICD-10-CM

## 2017-05-26 DIAGNOSIS — F0150 Vascular dementia without behavioral disturbance: Secondary | ICD-10-CM | POA: Diagnosis not present

## 2017-05-26 DIAGNOSIS — I6930 Unspecified sequelae of cerebral infarction: Secondary | ICD-10-CM | POA: Diagnosis not present

## 2017-05-26 NOTE — Progress Notes (Signed)
Location:   [facility] Room Number: 229 B Place of Service:  SNF (31)   CODE STATUS: Full code  No Known Allergies  Chief Complaint  Patient presents with  . Acute Visit    Care Plan Meeting    HPI:  We have come together for his care plan meeting. His weight has been stable. He is no longer using his peg tube for supplemental feedings. His family would like to have his peg tube removed; if possible. He continues to maintain a steady weight; will have tube removed over the next couple of weeks. We did discuss his advanced directives; goals and expected outcomes. His family has filled out his MOST form. There are no reports of aspiration; no changes in appetite; no indications of pain present. There are no nursing concerns at this time.    Past Medical History:  Diagnosis Date  . Arthritis   . Contracture, left hand   . 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 Depression   . Hyperlipidemia   . Hypertension   . Spasticity   . Stroke (HCC)   . Sudden cardiac arrest (HCC) 12/01/2016    Past Surgical History:  Procedure Laterality Date  . HERNIA REPAIR    . PEG TUBE PLACEMENT  07/01/2016    Social History   Socioeconomic History  . Marital status: Widowed    Spouse name: Not on file  . Number of children: Not on file  . Years of education: Not on file  . Highest education level: Not on file  Social Needs  . Financial resource strain: Not on file  . Food insecurity - worry: Not on file  . Food insecurity - inability: Not on file  . Transportation needs - medical: Not on file  . Transportation needs - non-medical: Not on file  Occupational History  . Occupation: Disabled  Tobacco Use  . Smoking status: Never Smoker  . Smokeless tobacco: Never Used  Substance and Sexual Activity  . Alcohol use: No    Frequency: Never  . Drug use: No  . Sexual activity: Not on file  Other Topics Concern  . Not on file  Social History Narrative   Resides at [employer] [facility].   No caffeine use.   Family History  Problem Relation Age of Onset  . Hypertension Other       VITAL SIGNS BP 123/76   Pulse 70   Temp (!) 96.9 F (36.1 C)   Resp 18   Ht 6' (1.829 m)   Wt 167 lb (75.8 kg)   SpO2 93%   BMI 22.65 kg/m   Outpatient Encounter Medications as of 05/26/2017  Medication Sig  . amLODipine (NORVASC) 10 MG tablet Place 10 mg into feeding tube daily.  Marland Kitchen aspirin 325 MG tablet Place 325 mg into feeding tube daily.  Marland Kitchen atorvastatin (LIPITOR) 20 MG tablet Place 20 mg into feeding tube at bedtime.   Marland Kitchen FLUoxetine (PROZAC) 20 MG capsule Place 20 mg into feeding tube daily.  . hydrALAZINE (APRESOLINE) 25 MG tablet Place 25 mg into feeding tube 2 (two) times daily.  Marland Kitchen levETIRAcetam (KEPPRA) 100 MG/ML solution Place 5 mLs (500 mg total) into feeding tube 2 (two) times daily.  . Nutritional Supplements (FEEDING SUPPLEMENT, JEVITY 1.5 CAL,) LIQD Place 237 mLs into feeding tube. Administer after meals if patient eats less than 50% of meal  . Nutritional Supplements (NUTRITIONAL SUPPLEMENT PO) HSG Regular Diet - HSG Mech  Soft texture, regular consistency  . Nutritional Supplements (NUTRITIONAL SUPPLEMENT PO) Take by mouth. Enteral Feed Order at bedtime, bedtime snack. Administer 1 can  Every night at bedtime.  Flush G-tube with 30ml water prior and post administration  . risperiDONE (RISPERDAL) 1 MG tablet Place 1 mg into feeding tube at bedtime.  . tizanidine (ZANAFLEX) 2 MG capsule Place 1 capsule (2 mg total) into feeding tube 2 (two) times daily.  . [DISCONTINUED] Amino Acids-Protein Hydrolys (FEEDING SUPPLEMENT, PRO-STAT SUGAR FREE 64,) LIQD Place 30 mLs into feeding tube 2 (two) times daily. (Patient not taking: Reported on 05/26/2017)   No facility-administered encounter medications on file as of 05/26/2017.      SIGNIFICANT DIAGNOSTIC EXAMS  PREVIOUS:   12-01-16: ct angio of chest:  1. No evidence of acute pulmonary embolism is seen. 2.  Fusiform dilatation of the ascending thoracic aorta measuring up to 41 mm in diameter. Recommend annual imaging followup by CTA or MRA.  3. Possible small hiatal hernia.  12-02-16: 2-d echo:  - Left ventricle: The cavity size was mildly dilated. Wall motion  was normal; there were no regional wall motion abnormalities. Mildly reduced global EF 50%   Doppler parameters are consistent with abnormal left ventricular relaxation (grade 1 diastolic dysfunction). - No significant valvular abnormality - Mild dilatation of aortic root (4.1 cm) and proximal ascending aorta (3.9 cm)   12-23-16: chest x-ray: There is no pneumonia nor other acute cardiopulmonary abnormality.  12-23-16: ct of head: 1. Multifocal infarcts. Extensive periventricular decreased attenuation, suspect small vessel vascular disease, age advanced, although a degree of superimposed demyelination cannot be excluded. Small vessel disease also noted in the basilar perforator distribution of the mid pons. No acute infarct evident. No intracranial mass, hemorrhage, or extra-axial fluid collection. 2.  Foci of arterial vascular calcification, age advanced. 3.  Presumed cerumen in each external auditory canal.  12-24-16: EEG: This awake EEG is abnormal due to diffuse slowing of the waking background.   12-25-16: MRI: of brain:  1. Motion degraded examination without evidence of acute intracranial abnormality. 2. Chronic left PCA infarct. 3. Extensive chronic small vessel ischemic changes including chronic lacunar infarcts as above. 4. Numerous chronic microhemorrhages in a pattern consistent with chronic hypertension.  02-06-17: chest x-ray: No active disease.   02-06-17: ct of head: No acute intracranial abnormality. Stable left occipital lobe infarct and severe chronic small vessel disease  02-08-17: chest x-ray: No active disease.   NO NEW EXAMS      LABS REVIEWED; PREVIOUS  10-26-16: wbc 4.6; hgb 13.5; hct 41.7; mcv 91.8; plt  166 glucose 100; bun 17.2; creat 0.72; k+ 4.4 ;na++ 141; liver normal albumin 3.7 hgb a1c 4.5  11-05-16: glucose 85; bun 9.3; creat 0.66; k+ 4.2; na++ 138; ca 9.7 12-01-16: wbc 2.8; hgb 11.1; hct 33.6; mcv 88.2. plt 131; glucose 99; bun 17; creat 1.04; k+ 3.7; na++ 138; ca 8.9; liver normal albumin 3.1 12-03-16: wbc 2.8; hgb 12.1; hct 35.6; mcv 86.0; plt 143glucose 85; bun 7; creat 0.78; k+ 3.5; na++ 139; ca 9.2 12-23-16: wbc 5.4; hgb 12.8; hct 38.3; mcv 87.2; plt 185; glucose 105; bun 10; creat 0.75; k+ 3.3; na++ 137; ca 9.3; ast 13 alt 12; albumin 3.8 12-25-16: wbc 3.1; hgb 11.4; hct 35.6; mcv 89.7; plt 171; glucose 81; bun 14; creat 0.91; k+ 3.5; na++ 140; ca 9.0; ammonia 34 02-06-17: wbc 4.4; hgb 13.6; hct 40.5; mcv 89.8; plt 178; glucose 108; bun 10; creat 0.71; k+ 3.6; na++138; ca  9.6; liver normal albumin 4.1; urine culture: <10,000 colonies 02-08-17: wbc 4.3; hgb 11.5; hct 35.2; mcv 90.5 ;plt 145; glucose 83; bun 20; creat 0.80; k+ 3.7; na++ 140; ca 9.0; mag 1.7 02-10-17: wbc 2.8; hgb 12.0; hct 35.4; mcv 89.2; plt 148; glucose 86; bun 14; creat 0.59; k+ 3.9; na++ 138; ca 8.7  NO NEW LABS         Review of Systems  Unable to perform ROS: Other (expressive aphasia )    Physical Exam  Constitutional: He appears well-developed and well-nourished. No distress.  Neck: No thyromegaly present.  Cardiovascular: Normal rate, regular rhythm and intact distal pulses.  Murmur heard. 1/6  Pulmonary/Chest: Effort normal and breath sounds normal. No respiratory distress.  Abdominal: Soft. Bowel sounds are normal. He exhibits no distension. There is no tenderness.  Peg tube present without signs of infection present   Musculoskeletal: He exhibits no edema.  Right hemiplegia   Lymphadenopathy:    He has no cervical adenopathy.  Neurological: He is alert.  Skin: Skin is warm and dry. He is not diaphoretic.  Psychiatric: He has a normal mood and affect.    ASSESSMENT/ PLAN:  TODAY:   1. 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 Dysphagia due to recent cerebrovascular accident:  3. Vascular dementia without behavioral disturbance:    Will continue his current plan of care Will stop his hs tube feeding and will give him an HS snack if his weight remains stable will have his peg tube removed.   Time spent patient and family: 45 minutes discussed goals of care and expectations. Spent 20 minutes discussing his advanced directives and filling out his MOST form    MD is aware of resident's narcotic use and is in agreement with current plan of care. We will attempt to wean resident as apropriate    Synthia Innocent NP [facility]  Contact [phone] Monday through Friday 8am- 5pm  After hours call [phone]

## 2017-06-03 ENCOUNTER — Encounter: Payer: Self-pay | Admitting: Adult Health

## 2017-06-03 ENCOUNTER — Non-Acute Institutional Stay (SKILLED_NURSING_FACILITY): Payer: Medicaid Other | Admitting: Adult Health

## 2017-06-03 DIAGNOSIS — L89152 Pressure ulcer of sacral region, stage 2: Secondary | ICD-10-CM | POA: Diagnosis not present

## 2017-06-03 NOTE — Progress Notes (Signed)
Location:   [facility] Room Number: 229 B Place of Service:  SNF (31)   CODE STATUS: Full Code (Most form updated 05/26/17)  No Known Allergies  Chief Complaint  Patient presents with  . Acute Visit    Wound Care    HPI:  He was found to have a superficial stage II area on his sacrum. There are no signs of infection present present. There are no reports of pain present. There are no reports of changes in appetite.    Past Medical History:  Diagnosis Date  . Arthritis   . Contracture, left hand   . 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 Depression   . Hyperlipidemia   . Hypertension   . Spasticity   . Stroke (HCC)   . Sudden cardiac arrest (HCC) 12/01/2016    Past Surgical History:  Procedure Laterality Date  . HERNIA REPAIR    . PEG TUBE PLACEMENT  07/01/2016    Social History   Socioeconomic History  . Marital status: Widowed    Spouse name: Not on file  . Number of children: Not on file  . Years of education: Not on file  . Highest education level: Not on file  Occupational History  . Occupation: Disabled  Social Needs  . Financial resource strain: Not on file  . Food insecurity:    Worry: Not on file    Inability: Not on file  . Transportation needs:    Medical: Not on file    Non-medical: Not on file  Tobacco Use  . Smoking status: Never Smoker  . Smokeless tobacco: Never Used  Substance and Sexual Activity  . Alcohol use: No    Frequency: Never  . Drug use: No  . Sexual activity: Not on file  Lifestyle  . Physical activity:    Days per week: Not on file    Minutes per session: Not on file  . Stress: Not on file  Relationships  . Social connections:    Talks on phone: Not on file    Gets together: Not on file    Attends religious service: Not on file    Active member of club or organization: Not on file    Attends meetings of clubs or organizations: Not on file    Relationship status: Not on file  . Intimate partner  violence:    Fear of current or ex partner: Not on file    Emotionally abused: Not on file    Physically abused: Not on file    Forced sexual activity: Not on file  Other Topics Concern  . Not on file  Social History Narrative   Resides at [facility] [employer] [facility].   No caffeine use.   Family History  Problem Relation Age of Onset  . Hypertension Other       VITAL SIGNS Ht 6' (1.829 m)   Wt 167 lb (75.8 kg)   BMI 22.65 kg/m   Outpatient Encounter Medications as of 06/03/2017  Medication Sig  . amLODipine (NORVASC) 10 MG tablet Place 10 mg into feeding tube daily.  Marland Kitchen aspirin 325 MG tablet Place 325 mg into feeding tube daily.  Marland Kitchen atorvastatin (LIPITOR) 20 MG tablet Place 20 mg into feeding tube at bedtime.   Marland Kitchen FLUoxetine (PROZAC) 20 MG capsule Place 20 mg into feeding tube daily.  . hydrALAZINE (APRESOLINE) 25 MG tablet Place 25 mg into feeding tube 2 (two) times daily.  Marland Kitchen levETIRAcetam (KEPPRA)  100 MG/ML solution Place 5 mLs (500 mg total) into feeding tube 2 (two) times daily.  . Nutritional Supplements (NUTRITIONAL SUPPLEMENT PO) HSG Regular Diet - HSG Mech Soft texture, regular consistency  . Nutritional Supplements (NUTRITIONAL SUPPLEMENT PO) Take by mouth. Enteral Feed Order at bedtime, bedtime snack. Administer 1 can  Every night at bedtime.  Flush G-tube with 30ml water prior and post administration  . risperiDONE (RISPERDAL) 1 MG tablet Place 1 mg into feeding tube at bedtime.  . tizanidine (ZANAFLEX) 2 MG capsule Place 1 capsule (2 mg total) into feeding tube 2 (two) times daily.  . [DISCONTINUED] Nutritional Supplements (FEEDING SUPPLEMENT, JEVITY 1.5 CAL,) LIQD Place 237 mLs into feeding tube. Administer after meals if patient eats less than 50% of meal   No facility-administered encounter medications on file as of 06/03/2017.      SIGNIFICANT DIAGNOSTIC EXAMS  PREVIOUS:   12-01-16: ct angio of chest:  1. No evidence of acute pulmonary embolism is seen. 2.  Fusiform dilatation of the ascending thoracic aorta measuring up to 41 mm in diameter. Recommend annual imaging followup by CTA or MRA.  3. Possible small hiatal hernia.  12-02-16: 2-d echo:  - Left ventricle: The cavity size was mildly dilated. Wall motion  was normal; there were no regional wall motion abnormalities. Mildly reduced global EF 50%   Doppler parameters are consistent with abnormal left ventricular relaxation (grade 1 diastolic dysfunction). - No significant valvular abnormality - Mild dilatation of aortic root (4.1 cm) and proximal ascending aorta (3.9 cm)   12-23-16: chest x-ray: There is no pneumonia nor other acute cardiopulmonary abnormality.  12-23-16: ct of head: 1. Multifocal infarcts. Extensive periventricular decreased attenuation, suspect small vessel vascular disease, age advanced, although a degree of superimposed demyelination cannot be excluded. Small vessel disease also noted in the basilar perforator distribution of the mid pons. No acute infarct evident. No intracranial mass, hemorrhage, or extra-axial fluid collection. 2.  Foci of arterial vascular calcification, age advanced. 3.  Presumed cerumen in each external auditory canal.  12-24-16: EEG: This awake EEG is abnormal due to diffuse slowing of the waking background.   12-25-16: MRI: of brain:  1. Motion degraded examination without evidence of acute intracranial abnormality. 2. Chronic left PCA infarct. 3. Extensive chronic small vessel ischemic changes including chronic lacunar infarcts as above. 4. Numerous chronic microhemorrhages in a pattern consistent with chronic hypertension.  02-06-17: chest x-ray: No active disease.   02-06-17: ct of head: No acute intracranial abnormality. Stable left occipital lobe infarct and severe chronic small vessel disease  02-08-17: chest x-ray: No active disease.   NO NEW EXAMS      LABS REVIEWED; PREVIOUS  10-26-16: wbc 4.6; hgb 13.5; hct 41.7; mcv 91.8; plt  166 glucose 100; bun 17.2; creat 0.72; k+ 4.4 ;na++ 141; liver normal albumin 3.7 hgb a1c 4.5  11-05-16: glucose 85; bun 9.3; creat 0.66; k+ 4.2; na++ 138; ca 9.7 12-01-16: wbc 2.8; hgb 11.1; hct 33.6; mcv 88.2. plt 131; glucose 99; bun 17; creat 1.04; k+ 3.7; na++ 138; ca 8.9; liver normal albumin 3.1 12-03-16: wbc 2.8; hgb 12.1; hct 35.6; mcv 86.0; plt 143glucose 85; bun 7; creat 0.78; k+ 3.5; na++ 139; ca 9.2 12-23-16: wbc 5.4; hgb 12.8; hct 38.3; mcv 87.2; plt 185; glucose 105; bun 10; creat 0.75; k+ 3.3; na++ 137; ca 9.3; ast 13 alt 12; albumin 3.8 12-25-16: wbc 3.1; hgb 11.4; hct 35.6; mcv 89.7; plt 171; glucose 81; bun 14; creat 0.91; k+  3.5; na++ 140; ca 9.0; ammonia 34 02-06-17: wbc 4.4; hgb 13.6; hct 40.5; mcv 89.8; plt 178; glucose 108; bun 10; creat 0.71; k+ 3.6; na++138; ca  9.6; liver normal albumin 4.1; urine culture: <10,000 colonies 02-08-17: wbc 4.3; hgb 11.5; hct 35.2; mcv 90.5 ;plt 145; glucose 83; bun 20; creat 0.80; k+ 3.7; na++ 140; ca 9.0; mag 1.7 02-10-17: wbc 2.8; hgb 12.0; hct 35.4; mcv 89.2; plt 148; glucose 86; bun 14; creat 0.59; k+ 3.9; na++ 138; ca 8.7  NO NEW LABS         Review of Systems  Unable to perform ROS: Other (expressive aphasia )    Physical Exam  Constitutional: He appears well-developed and well-nourished. No distress.  Neck: No thyromegaly present.  Cardiovascular: Normal rate, regular rhythm and intact distal pulses.  Murmur heard. 1/6  Pulmonary/Chest: Effort normal and breath sounds normal. No respiratory distress.  Abdominal: Soft. Bowel sounds are normal. He exhibits no distension.  Peg tube present without signs of infection present   Musculoskeletal: He exhibits no edema.  Right hemiplegia   Lymphadenopathy:    He has no cervical adenopathy.  Neurological: He is alert.  Skin: Skin is warm and dry. He is not diaphoretic.  Superficial stage II on sacrum on signs of infection present   Psychiatric: He has a normal mood and affect.      ASSESSMENT/ PLAN:  TODAY:   1.  Stage II ulceration sacrum: will use border gauze daily and will continue to monitor his status.   MD is aware of resident's narcotic use and is in agreement with current plan of care. We will attempt to wean resident as apropriate    Synthia Innocenteborah Caramia Boutin NP [facility]  Contact [phone] Monday through Friday 8am- 5pm  After hours call [phone]

## 2017-06-06 ENCOUNTER — Encounter: Payer: Self-pay | Admitting: Internal Medicine

## 2017-06-06 NOTE — Progress Notes (Signed)
Patient ID: Jeremy Brennan, male   DOB: 08-Oct-1965, 52 y.o.   MRN: [id number]  Location:  [facility]   Place of Service:  SNF (31) Provider:  DR Dominion Kathan Ivan Croft, DO  Patient Care Team: Kirt Boys, DO as PCP - General (Internal Medicine)  Extended Emergency Contact Information Primary Emergency Contact: Gillion,Willie Address: [address]          [place], [place] [address] [place] Home Phone: [phone] Relation: Relative  Code Status:   Goals of care: Advanced Directive information Advanced Directives 06/03/2017  Does Patient Have a Medical Advance Directive? Yes  Type of Advance Directive Out of facility DNR (pink MOST or yellow form)  Does patient want to make changes to medical advance directive? No - Patient declined  Would patient like information on creating a medical advance directive? No - Patient declined  Pre-existing out of facility DNR order (yellow form or pink MOST form) Pink MOST form placed in chart (order not valid for inpatient use)     Chief Complaint  Patient presents with  . Acute Visit    right hand swelling    HPI:  Pt is a 52 y.o. male seen today for an acute visit for right hand swelling x few months. No pain. No known trauma. No recent falls. He is a poor historian due to dementia. Hx obtained from chart. No f/c.  Hx CVA with residual deficit - unchanged. 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 Arthritis   . Contracture, left hand   . 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 Depression   . Hyperlipidemia   . Hypertension   . Spasticity   . Stroke (HCC)   . Sudden cardiac arrest (HCC) 12/01/2016   Past Surgical History:  Procedure Laterality Date  . HERNIA REPAIR    . PEG TUBE PLACEMENT  07/01/2016    No Known Allergies  Outpatient Encounter Medications as of 04/08/2017   Medication Sig  . amLODipine (NORVASC) 10 MG tablet Place 10 mg into feeding tube daily.  Marland Kitchen aspirin 325 MG tablet Place 325 mg into feeding tube daily.  Marland Kitchen atorvastatin (LIPITOR) 20 MG tablet Place 20 mg into feeding tube at bedtime.   Marland Kitchen FLUoxetine (PROZAC) 20 MG capsule Place 20 mg into feeding tube daily.  . hydrALAZINE (APRESOLINE) 25 MG tablet Place 25 mg into feeding tube 2 (two) times daily.  Marland Kitchen levETIRAcetam (KEPPRA) 100 MG/ML solution Place 5 mLs (500 mg total) into feeding tube 2 (two) times daily.  . Nutritional Supplements (NUTRITIONAL SUPPLEMENT PO) HSG Regular Diet - HSG Mech Soft texture, regular consistency  . risperiDONE (RISPERDAL) 1 MG tablet Place 1 mg into feeding tube at bedtime.  . tizanidine (ZANAFLEX) 2 MG capsule Place 1 capsule (2 mg total) into feeding tube 2 (two) times daily.  . [DISCONTINUED] enoxaparin (LOVENOX) 40 MG/0.4ML injection Inject 0.4 mLs (40 mg total) into the skin daily. (Patient not taking: Reported on 05/17/2017)  . [DISCONTINUED] Nutritional Supplements (FEEDING SUPPLEMENT, JEVITY 1.5 CAL,) LIQD Place 237 mLs into feeding tube. Administer after meals if patient eats less than 50% of meal  . [DISCONTINUED] Amino Acids-Protein Hydrolys (FEEDING SUPPLEMENT, PRO-STAT SUGAR FREE 64,) LIQD Place 30 mLs into feeding tube 2 (two) times daily. (Patient not taking: Reported on 05/26/2017)   No facility-administered encounter medications on file as  of 04/08/2017.     Review of Systems  Immunization History  Administered Date(s) Administered  . Influenza-Unspecified 01/07/2017  . Pneumococcal-Unspecified 10/26/2016   Pertinent  Health Maintenance Due  Topic Date Due  . COLONOSCOPY  10/29/2017 (Originally 04/09/2013)  . INFLUENZA VACCINE  Completed   No flowsheet data found. Functional Status Survey:    Vitals:   04/08/17 2335  BP: 117/69  Pulse: 69  Temp: 98 F (36.7 C)  SpO2: 94%  Weight: 175 lb (79.4 kg)   Body mass index is 23.73  kg/m. Physical Exam  Constitutional: He appears well-developed and well-nourished.  Sitting in w/c in nAD  Musculoskeletal: He exhibits edema, tenderness and deformity.  LUE contracture; right wrist/hand swelling with TTP @ thumb base joint and dorsal swelling; reduced extension with pain on ROM; chronic RUE flexion contracture with poor grip strength   Neurological: He is alert. He displays tremor (LLE, constant).  Skin: Skin is warm and dry. No rash noted.  Psychiatric: He has a normal mood and affect. His behavior is normal.    Labs reviewed: Recent Labs    02/08/17 0516 02/09/17 0423 02/10/17 0623 03/17/17  NA 140 139 138 141  K 3.7 3.7 3.9 3.9  CL 109 108 106  --   CO2 26 26 27   --   GLUCOSE 83 86 86  --   BUN 20 19 14 9   CREATININE 0.80 0.72 0.59* 0.7  CALCIUM 9.0 8.3* 8.7*  --   MG 1.7 1.9  --   --    Recent Labs    12/01/16 1211 12/23/16 1614 02/06/17 02/06/17 1616  AST 14* 13* 9* 12*  ALT 14* 12* 9* 11*  ALKPHOS 32* 47 58 56  BILITOT 0.5 0.7  --  0.7  PROT 5.9* 7.1  --  7.7  ALBUMIN 3.1* 3.8  --  4.1   Recent Labs    02/06/17 1616  02/08/17 0516 02/09/17 0423 02/10/17 0758 03/17/17  WBC 4.4   < > 4.3 3.5* 2.8* 2.6  NEUTROABS 3.1  --  2.5  --   --  1  HGB 13.6   < > 11.5* 10.5* 12.0* 13.2*  HCT 40.5   < > 35.2* 31.8* 35.4* 38*  MCV 89.8   < > 90.5 90.1 89.2  --   PLT 178   < > 145* 154 148* 159   < > = values in this interval not displayed.   No results found for: TSH Lab Results  Component Value Date   HGBA1C 4.5 10/26/2016   Lab Results  Component Value Date   CHOL 109 12/08/2016   HDL 81 (A) 12/08/2016   LDLCALC 21 12/08/2016   TRIG 34 (A) 12/08/2016    Significant Diagnostic Results in last 30 days:  No results found.  Assessment/Plan   ICD-10-CM   1. Swelling of right hand M79.89    possibly 2/2 arthritis vs cva  2. Hemiparesis affecting right side as late effect of stroke [facility](HCC) I69.351     Check hand xray  Cont current meds  as ordered  Will follow  Labs/tests ordered:  Right hand xray   Zoran Yankee S. Ancil Linseyarter, D. O., F. A. C. O. I.  [facility] [address] [place], [place] [address] [phone] Cell (Monday-Friday 8 AM - 5 PM) [phone] After 5 PM and follow prompts

## 2017-06-07 ENCOUNTER — Encounter: Payer: Self-pay | Admitting: Adult Health

## 2017-06-07 ENCOUNTER — Non-Acute Institutional Stay (SKILLED_NURSING_FACILITY): Payer: Medicaid Other | Admitting: Adult Health

## 2017-06-07 DIAGNOSIS — R45851 Suicidal ideations: Secondary | ICD-10-CM

## 2017-06-07 DIAGNOSIS — F329 Major depressive disorder, single episode, unspecified: Secondary | ICD-10-CM | POA: Diagnosis not present

## 2017-06-07 NOTE — Progress Notes (Signed)
Location:   [facility] Room Number: 229 B Place of Service:  SNF (31)   CODE STATUS: Full Code (Most form updated 05/26/17)  No Known Allergies  Chief Complaint  Patient presents with  . Acute Visit    Medication Review    HPI:  He is risperdal 1 mg nightly and prozac 20 mg daily for depression with suicidal ideation.  He is unable to fully participate in the hpi or ros due to his expressive aphasia; but did deny any feelings of suicide.  There are no reports of behavior issues; no reports of changes in sleep pattern or appetite.    Past Medical History:  Diagnosis Date  . Arthritis   . Contracture, left hand   . 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 Depression   . Hyperlipidemia   . Hypertension   . Spasticity   . Stroke (HCC)   . Sudden cardiac arrest (HCC) 12/01/2016    Past Surgical History:  Procedure Laterality Date  . HERNIA REPAIR    . PEG TUBE PLACEMENT  07/01/2016    Social History   Socioeconomic History  . Marital status: Widowed    Spouse name: Not on file  . Number of children: Not on file  . Years of education: Not on file  . Highest education level: Not on file  Occupational History  . Occupation: Disabled  Social Needs  . Financial resource strain: Not on file  . Food insecurity:    Worry: Not on file    Inability: Not on file  . Transportation needs:    Medical: Not on file    Non-medical: Not on file  Tobacco Use  . Smoking status: Never Smoker  . Smokeless tobacco: Never Used  Substance and Sexual Activity  . Alcohol use: No    Frequency: Never  . Drug use: No  . Sexual activity: Not on file  Lifestyle  . Physical activity:    Days per week: Not on file    Minutes per session: Not on file  . Stress: Not on file  Relationships  . Social connections:    Talks on phone: Not on file    Gets together: Not on file    Attends religious service: Not on file    Active member of club or organization: Not on file   Attends meetings of clubs or organizations: Not on file    Relationship status: Not on file  . Intimate partner violence:    Fear of current or ex partner: Not on file    Emotionally abused: Not on file    Physically abused: Not on file    Forced sexual activity: Not on file  Other Topics Concern  . Not on file  Social History Narrative   [facility]   Left-handed.   No caffeine use.   Family History  Problem Relation Age of Onset  . Hypertension Other       VITAL SIGNS Ht 6' (1.829 m)   Wt 167 lb (75.8 kg)   BMI 22.65 kg/m   Outpatient Encounter Medications as of 06/07/2017  Medication Sig  . amLODipine (NORVASC) 10 MG tablet Place 10 mg into feeding tube daily.  Marland Kitchen. aspirin 325 MG tablet Place 325 mg into feeding tube daily.  Marland Kitchen. atorvastatin (LIPITOR) 20 MG tablet Place 20 mg into feeding tube at bedtime.   Marland Kitchen. FLUoxetine (PROZAC) 20 MG capsule Place 20 mg into feeding tube daily.  .Marland Kitchen  hydrALAZINE (APRESOLINE) 25 MG tablet Place 25 mg into feeding tube 2 (two) times daily.  Marland Kitchen levETIRAcetam (KEPPRA) 100 MG/ML solution Place 5 mLs (500 mg total) into feeding tube 2 (two) times daily.  . Nutritional Supplements (NUTRITIONAL SUPPLEMENT PO) HSG Regular Diet - HSG Mech Soft texture, regular consistency  . Nutritional Supplements (NUTRITIONAL SUPPLEMENT PO) Take by mouth. Enteral Feed Order at bedtime, bedtime snack. Administer 1 can  Every night at bedtime.  Flush G-tube with 30ml water prior and post administration  . risperiDONE (RISPERDAL) 1 MG tablet Place 1 mg into feeding tube at bedtime.  . tizanidine (ZANAFLEX) 2 MG capsule Place 1 capsule (2 mg total) into feeding tube 2 (two) times daily.   No facility-administered encounter medications on file as of 06/07/2017.      SIGNIFICANT DIAGNOSTIC EXAMS  PREVIOUS:   12-01-16: ct angio of chest:  1. No evidence of acute pulmonary embolism is seen. 2. Fusiform dilatation of the ascending thoracic aorta measuring up to  41 mm in diameter. Recommend annual imaging followup by CTA or MRA.  3. Possible small hiatal hernia.  12-02-16: 2-d echo:  - Left ventricle: The cavity size was mildly dilated. Wall motion  was normal; there were no regional wall motion abnormalities. Mildly reduced global EF 50%   Doppler parameters are consistent with abnormal left ventricular relaxation (grade 1 diastolic dysfunction). - No significant valvular abnormality - Mild dilatation of aortic root (4.1 cm) and proximal ascending aorta (3.9 cm)   12-23-16: chest x-ray: There is no pneumonia nor other acute cardiopulmonary abnormality.  12-23-16: ct of head: 1. Multifocal infarcts. Extensive periventricular decreased attenuation, suspect small vessel vascular disease, age advanced, although a degree of superimposed demyelination cannot be excluded. Small vessel disease also noted in the basilar perforator distribution of the mid pons. No acute infarct evident. No intracranial mass, hemorrhage, or extra-axial fluid collection. 2.  Foci of arterial vascular calcification, age advanced. 3.  Presumed cerumen in each external auditory canal.  12-24-16: EEG: This awake EEG is abnormal due to diffuse slowing of the waking background.   12-25-16: MRI: of brain:  1. Motion degraded examination without evidence of acute intracranial abnormality. 2. Chronic left PCA infarct. 3. Extensive chronic small vessel ischemic changes including chronic lacunar infarcts as above. 4. Numerous chronic microhemorrhages in a pattern consistent with chronic hypertension.  02-06-17: chest x-ray: No active disease.   02-06-17: ct of head: No acute intracranial abnormality. Stable left occipital lobe infarct and severe chronic small vessel disease  02-08-17: chest x-ray: No active disease.   NO NEW EXAMS      LABS REVIEWED; PREVIOUS  10-26-16: wbc 4.6; hgb 13.5; hct 41.7; mcv 91.8; plt 166 glucose 100; bun 17.2; creat 0.72; k+ 4.4 ;na++ 141; liver normal  albumin 3.7 hgb a1c 4.5  11-05-16: glucose 85; bun 9.3; creat 0.66; k+ 4.2; na++ 138; ca 9.7 12-01-16: wbc 2.8; hgb 11.1; hct 33.6; mcv 88.2. plt 131; glucose 99; bun 17; creat 1.04; k+ 3.7; na++ 138; ca 8.9; liver normal albumin 3.1 12-03-16: wbc 2.8; hgb 12.1; hct 35.6; mcv 86.0; plt 143glucose 85; bun 7; creat 0.78; k+ 3.5; na++ 139; ca 9.2 12-23-16: wbc 5.4; hgb 12.8; hct 38.3; mcv 87.2; plt 185; glucose 105; bun 10; creat 0.75; k+ 3.3; na++ 137; ca 9.3; ast 13 alt 12; albumin 3.8 12-25-16: wbc 3.1; hgb 11.4; hct 35.6; mcv 89.7; plt 171; glucose 81; bun 14; creat 0.91; k+ 3.5; na++ 140; ca 9.0; ammonia 34 02-06-17: wbc  4.4; hgb 13.6; hct 40.5; mcv 89.8; plt 178; glucose 108; bun 10; creat 0.71; k+ 3.6; na++138; ca  9.6; liver normal albumin 4.1; urine culture: <10,000 colonies 02-08-17: wbc 4.3; hgb 11.5; hct 35.2; mcv 90.5 ;plt 145; glucose 83; bun 20; creat 0.80; k+ 3.7; na++ 140; ca 9.0; mag 1.7 02-10-17: wbc 2.8; hgb 12.0; hct 35.4; mcv 89.2; plt 148; glucose 86; bun 14; creat 0.59; k+ 3.9; na++ 138; ca 8.7  NO NEW LABS         Review of Systems  Unable to perform ROS: Other (expressive aphasia )   Physical Exam  Constitutional: He appears well-developed and well-nourished. No distress.  Neck: No thyromegaly present.  Cardiovascular: Normal rate, regular rhythm and intact distal pulses.  Murmur heard. 1/6  Pulmonary/Chest: Effort normal and breath sounds normal. No respiratory distress.  Abdominal: Soft. Bowel sounds are normal. He exhibits no distension. There is no tenderness.  Peg tube present without signs of infection present.   Musculoskeletal: He exhibits no edema.  Right hemiplegia   Lymphadenopathy:    He has no cervical adenopathy.  Neurological: He is alert.  Skin: Skin is warm and dry. He is not diaphoretic.  Psychiatric: He has a normal mood and affect.     ASSESSMENT/ PLAN:  TODAY:   1.   Depression with suicidal ideation: is emotionally stable will continue  prozac 20 mg daily and will lower risperdal to 0.5 mg nightly and will monitor his status    MD is aware of resident's narcotic use and is in agreement with current plan of care. We will attempt to wean resident as apropriate    Synthia Innocent NP [facility]  Contact [phone] Monday through Friday 8am- 5pm  After hours call [phone]

## 2017-07-05 ENCOUNTER — Encounter: Payer: Self-pay | Admitting: Internal Medicine

## 2017-07-05 ENCOUNTER — Non-Acute Institutional Stay (SKILLED_NURSING_FACILITY): Payer: Medicaid Other | Admitting: Internal Medicine

## 2017-07-05 DIAGNOSIS — F329 Major depressive disorder, single episode, unspecified: Secondary | ICD-10-CM

## 2017-07-05 DIAGNOSIS — Z931 Gastrostomy status: Secondary | ICD-10-CM

## 2017-07-05 DIAGNOSIS — H109 Unspecified conjunctivitis: Secondary | ICD-10-CM

## 2017-07-05 DIAGNOSIS — I69351 Hemiplegia and hemiparesis following cerebral infarction affecting right dominant side: Secondary | ICD-10-CM

## 2017-07-05 DIAGNOSIS — I10 Essential (primary) hypertension: Secondary | ICD-10-CM

## 2017-07-05 DIAGNOSIS — F0150 Vascular dementia without behavioral disturbance: Secondary | ICD-10-CM

## 2017-07-05 DIAGNOSIS — R45851 Suicidal ideations: Secondary | ICD-10-CM

## 2017-07-05 DIAGNOSIS — F32A Depression, unspecified: Secondary | ICD-10-CM

## 2017-07-05 NOTE — Progress Notes (Signed)
Patient ID: Jeremy Brennan, male   DOB: 1965-04-04, 52 y.o.   MRN: [id number]  Location:  [facility] Room Number: 229 B Place of Service:  SNF [phone]) Provider:  DR Shevonne Wolf Ivan Croft, DO  Patient Care Team: Kirt Boys, DO as PCP - General (Internal Medicine) Sharee Holster, NP as Nurse Practitioner (Geriatric Medicine) [facility]  Extended Emergency Contact Information Primary Emergency Contact: Gillion,Willie Address: [address]          [place], [place] [address] [place] Home Phone: [phone] Relation: Relative  Code Status:  Full code Goals of care: Advanced Directive information Advanced Directives 07/05/2017  Does Patient Have a Medical Advance Directive? Yes  Type of Advance Directive Out of facility DNR (pink MOST or yellow form)  Does patient want to make changes to medical advance directive? No - Patient declined  Would patient like information on creating a medical advance directive? No - Patient declined  Pre-existing out of facility DNR order (yellow form or pink MOST form) Pink MOST form placed in chart (order not valid for inpatient use)     Chief Complaint  Patient presents with  . Medical Management of Chronic Issues    1 month follow up    HPI:  Pt is a 52 y.o. male seen today for medical management of chronic diseases.  He has no concerns. R>L eyelid flaking with redness since Oct 2018. No f/c. He is a poor historian due to dementia. Hx obtained from chart. No falls. Appetite ok and sleeps well. No nursing issues. He was tx for stage 2 sacral pressure ulcer at the end of March 2019. Followed by wound care.   Chronic cerebrovascular accident has hemiparesis affecting right side as late effect of stroke - stable on asa 325 mg daily; zanaflex 2 mg twice daily for his spasticity.   HTN - stable on norvasc 10 mg daily and hydralazine 25 mg twice daily   Dysphagia  - 2/2  to CVA ; he gets nutrition via [facility] if he eats less than 50% of his meals  Depression - with SI; mood stable on prozac 20 mg daily and riperdal 1 mg nightly   Sz d/o - stable on keppra 500 mg twice daily   Hyperlipidemia - stable on lipitor 20 mg daily   Vascular dementia - stable; no change in cognition      Past Medical History:  Diagnosis Date  . Arthritis   . Contracture, left hand   . 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 Depression   . Hyperlipidemia   . Hypertension   . Spasticity   . Stroke (HCC)   . Sudden cardiac arrest (HCC) 12/01/2016   Past Surgical History:  Procedure Laterality Date  . HERNIA REPAIR    . PEG TUBE PLACEMENT  07/01/2016    No Known Allergies  Outpatient Encounter Medications as of 07/05/2017  Medication Sig  . amLODipine (NORVASC) 10 MG tablet Place 10 mg into feeding tube daily.  Marland Kitchen aspirin 325 MG tablet Place 325 mg into feeding tube daily.  Marland Kitchen atorvastatin (LIPITOR) 20 MG tablet Place 20 mg into feeding tube at bedtime.   . ENSURE (ENSURE) Take 120 mLs by mouth 2 (two) times daily between meals.  Marland Kitchen FLUoxetine (PROZAC) 20 MG capsule Place 20 mg into feeding tube daily.  . hydrALAZINE (APRESOLINE) 25 MG tablet Place 25 mg into feeding tube 2 (two)  times daily.  Marland Kitchen levETIRAcetam (KEPPRA) 100 MG/ML solution Place 5 mLs (500 mg total) into feeding tube 2 (two) times daily.  . Nutritional Supplements (NUTRITIONAL SUPPLEMENT PO) HSG Regular Diet - HSG Mech Soft texture, regular consistency  . risperiDONE (RISPERDAL) 1 MG tablet Place 1 mg into feeding tube at bedtime.  . tizanidine (ZANAFLEX) 2 MG capsule Place 1 capsule (2 mg total) into feeding tube 2 (two) times daily.  . [DISCONTINUED] Nutritional Supplements (NUTRITIONAL SUPPLEMENT PO) Take by mouth. Enteral Feed Order at bedtime, bedtime snack. Administer 1 can  Every night at bedtime.  Flush G-tube with 30ml water prior and post administration   No facility-administered encounter medications  on file as of 07/05/2017.     Review of Systems  Unable to perform ROS: Dementia    Immunization History  Administered Date(s) Administered  . Influenza-Unspecified 01/07/2017  . Pneumococcal-Unspecified 10/26/2016   Pertinent  Health Maintenance Due  Topic Date Due  . COLONOSCOPY  10/29/2017 (Originally 04/09/2013)  . INFLUENZA VACCINE  10/07/2017   No flowsheet data found. Functional Status Survey:    Vitals:   07/05/17 0940  BP: 126/84  Pulse: 64  Resp: 20  Temp: 98.8 F (37.1 C)  SpO2: 95%  Weight: 168 lb 3.2 oz (76.3 kg)  Height: 6' (1.829 m)   Body mass index is 22.81 kg/m. Physical Exam  Constitutional: He appears well-developed and well-nourished.  Sitting in w/c in NAD  HENT:  Mouth/Throat: Oropharynx is clear and moist.  MMM; no oral thrush  Eyes: Pupils are equal, round, and reactive to light. No scleral icterus.  R>L eyelash flaking with increased redness of upper/lower lid on right  Neck: Neck supple. Carotid bruit is not present. No thyromegaly present.  Cardiovascular: Normal rate, regular rhythm and intact distal pulses. Exam reveals no gallop and no friction rub.  Murmur (1/6 SEM) heard. no distal LE swelling. No calf TTP  Pulmonary/Chest: Effort normal and breath sounds normal. He has no wheezes. He has no rales. He exhibits no tenderness.  Abdominal: Soft. Bowel sounds are normal. He exhibits no distension, no abdominal bruit, no pulsatile midline mass and no mass. There is no hepatomegaly. There is no tenderness. There is no rebound and no guarding.  (+) Peg tube intact with no d/c or redness at insertion site  Musculoskeletal: He exhibits edema and deformity (RUE contracture).  Lymphadenopathy:    He has no cervical adenopathy.  Neurological: He is alert. He has normal reflexes.  Right hemiparesis  Skin: Skin is warm and dry. No rash noted.  Sacral wound - stage 2; followed by facility wound care  Psychiatric: He has a normal mood and affect.  His behavior is normal.    Labs reviewed: Recent Labs    02/08/17 0516 02/09/17 0423 02/10/17 0623 03/17/17  NA 140 139 138 141  K 3.7 3.7 3.9 3.9  CL 109 108 106  --   CO2 --   GLUCOSE 83 86 86  --   BUN CREATININE 0.80 0.72 0.59* 0.7  CALCIUM 9.0 8.3* 8.7*  --   MG 1.7 1.9  --   --    Recent Labs    12/01/16 1211 12/23/16 1614 02/06/17 02/06/17 1616  AST 14* 13* 9* 12*  ALT 14* 12* 9* 11*  ALKPHOS 32* 47 58 56  BILITOT 0.5 0.7  --  0.7  PROT 5.9* 7.1  --  7.7  ALBUMIN 3.1* 3.8  --  4.1   Recent Labs    02/06/17 1616  02/08/17 0516 02/09/17 0423 02/10/17 0758 03/17/17  WBC 4.4   < > 4.3 3.5* 2.8* 2.6  NEUTROABS 3.1  --  2.5  --   --  1  HGB 13.6   < > 11.5* 10.5* 12.0* 13.2*  HCT 40.5   < > 35.2* 31.8* 35.4* 38*  MCV 89.8   < > 90.5 90.1 89.2  --   PLT 178   < > 145* 154 148* 159   < > = values in this interval not displayed.   No results found for: TSH Lab Results  Component Value Date   HGBA1C 4.5 10/26/2016   Lab Results  Component Value Date   CHOL 109 12/08/2016   HDL 81 (A) 12/08/2016   LDLCALC 21 12/08/2016   TRIG 34 (A) 12/08/2016    Significant Diagnostic Results in last 30 days:  No results found.  Assessment/Plan   ICD-10-CM   1. Conjunctivitis of left eye, unspecified conjunctivitis type H10.9   2. Vascular dementia without behavioral disturbance F01.50   3. S/P percutaneous endoscopic gastrostomy (PEG) tube placement (HCC) Z93.1   4. Essential hypertension I10   5. Hemiparesis affecting right side as late effect of stroke (HCC) I69.351   6. Depression with suicidal ideation F32.9    R45.851     START TOBRAMYCIN 0.3% SOLUTION INSTILL 1 GTT OU QID X 7 DAYS  Cont other meds as ordered  PT/OT/ST as indicated  Cont TF as ordered  Wound care as ordered  Will follow  Labs/tests ordered: none   Treshawn Allen S. Ancil Linsey  [facility] [address] [place], [place] [address] [phone] Cell (Monday-Friday 8 AM - 5 PM) [phone] After 5 PM and follow prompts

## 2017-08-05 ENCOUNTER — Non-Acute Institutional Stay (SKILLED_NURSING_FACILITY): Payer: Medicaid Other | Admitting: Adult Health

## 2017-08-05 ENCOUNTER — Encounter: Payer: Self-pay | Admitting: Adult Health

## 2017-08-05 DIAGNOSIS — F32A Depression, unspecified: Secondary | ICD-10-CM

## 2017-08-05 DIAGNOSIS — R569 Unspecified convulsions: Secondary | ICD-10-CM | POA: Diagnosis not present

## 2017-08-05 DIAGNOSIS — I69391 Dysphagia following cerebral infarction: Secondary | ICD-10-CM | POA: Diagnosis not present

## 2017-08-05 DIAGNOSIS — R45851 Suicidal ideations: Secondary | ICD-10-CM | POA: Diagnosis not present

## 2017-08-05 DIAGNOSIS — F329 Major depressive disorder, single episode, unspecified: Secondary | ICD-10-CM | POA: Diagnosis not present

## 2017-08-05 NOTE — Progress Notes (Signed)
Location:   [facility] [address] Room Number: 229 B Place of Service:  SNF (31)   CODE STATUS: Full Code (Most form updated 05/26/17)  No Known Allergies  Chief Complaint  Patient presents with  . Medical Management of Chronic Issues    Dysphagia; depression; seizure    HPI:  He is a 52 year old long term resident of this facility being seen for the management of his chronic illnesses: dysphagia; depression; seizure. He is unable to fully participate in the hpi or ros. There are no reports of aspiration; change in appetite; or seizures. There are no repots of uncontrolled pain. There are no nursing concerns at this time.   Past Medical History:  Diagnosis Date  . Arthritis   . Contracture, left hand   . 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 Depression   . Hyperlipidemia   . Hypertension   . Spasticity   . Stroke (HCC)   . Sudden cardiac arrest (HCC) 12/01/2016    Past Surgical History:  Procedure Laterality Date  . HERNIA REPAIR    . PEG TUBE PLACEMENT  07/01/2016    Social History   Socioeconomic History  . Marital status: Widowed    Spouse name: Not on file  . Number of children: Not on file  . Years of education: Not on file  . Highest education level: Not on file  Occupational History  . Occupation: Disabled  Social Needs  . Financial resource strain: Not on file  . Food insecurity:    Worry: Not on file    Inability: Not on file  . Transportation needs:    Medical: Not on file    Non-medical: Not on file  Tobacco Use  . Smoking status: Never Smoker  . Smokeless tobacco: Never Used  Substance and Sexual Activity  . Alcohol use: No    Frequency: Never  . Drug use: No  . Sexual activity: Not on file  Lifestyle  . Physical activity:    Days per week: Not on file    Minutes per session: Not on file  . Stress: Not on file  Relationships  . Social connections:    Talks on phone: Not on file    Gets together: Not on file    Attends religious  service: Not on file    Active member of club or organization: Not on file    Attends meetings of clubs or organizations: Not on file    Relationship status: Not on file  . Intimate partner violence:    Fear of current or ex partner: Not on file    Emotionally abused: Not on file    Physically abused: Not on file    Forced sexual activity: Not on file  Other Topics Concern  . Not on file  Social History Narrative   Resides at [facility].   No caffeine use.   Family History  Problem Relation Age of Onset  . Hypertension Other       VITAL SIGNS BP (!) 132/93   Pulse 70   Temp (!) 97.5 F (36.4 C)   Resp 18   Ht 6' (1.829 m)   Wt 168 lb (76.2 kg)   SpO2 95%   BMI 22.78 kg/m   Outpatient Encounter Medications as of 08/05/2017  Medication Sig  . amLODipine (NORVASC) 10 MG tablet Place 10 mg into feeding tube daily.  Marland Kitchen aspirin 325 MG tablet Place 325 mg into feeding  tube daily.  Marland Kitchen atorvastatin (LIPITOR) 20 MG tablet Place 20 mg into feeding tube at bedtime.   . ENSURE (ENSURE) Take 120 mLs by mouth 2 (two) times daily between meals.  Marland Kitchen FLUoxetine (PROZAC) 20 MG capsule Place 20 mg into feeding tube daily.  . hydrALAZINE (APRESOLINE) 25 MG tablet Place 25 mg into feeding tube 2 (two) times daily.  Marland Kitchen levETIRAcetam (KEPPRA) 100 MG/ML solution Place 5 mLs (500 mg total) into feeding tube 2 (two) times daily.  . Nutritional Supplements (NUTRITIONAL SUPPLEMENT PO) HSG Regular Diet - HSG Mech Soft texture, regular consistency  . risperiDONE (RISPERDAL) 0.5 MG tablet Place 0.5 mg into feeding tube at bedtime.   . tizanidine (ZANAFLEX) 2 MG capsule Place 1 capsule (2 mg total) into feeding tube 2 (two) times daily.   No facility-administered encounter medications on file as of 08/05/2017.      SIGNIFICANT DIAGNOSTIC EXAMS  PREVIOUS:   12-01-16: ct angio of chest:  1. No evidence of acute pulmonary embolism is seen. 2. Fusiform dilatation of the ascending  thoracic aorta measuring up to 41 mm in diameter. Recommend annual imaging followup by CTA or MRA.  3. Possible small hiatal hernia.  12-02-16: 2-d echo:  - Left ventricle: The cavity size was mildly dilated. Wall motion  was normal; there were no regional wall motion abnormalities. Mildly reduced global EF 50%   Doppler parameters are consistent with abnormal left ventricular relaxation (grade 1 diastolic dysfunction). - No significant valvular abnormality - Mild dilatation of aortic root (4.1 cm) and proximal ascending aorta (3.9 cm)   12-23-16: chest x-ray: There is no pneumonia nor other acute cardiopulmonary abnormality.  12-23-16: ct of head: 1. Multifocal infarcts. Extensive periventricular decreased attenuation, suspect small vessel vascular disease, age advanced, although a degree of superimposed demyelination cannot be excluded. Small vessel disease also noted in the basilar perforator distribution of the mid pons. No acute infarct evident. No intracranial mass, hemorrhage, or extra-axial fluid collection. 2.  Foci of arterial vascular calcification, age advanced. 3.  Presumed cerumen in each external auditory canal.  12-24-16: EEG: This awake EEG is abnormal due to diffuse slowing of the waking background.   12-25-16: MRI: of brain:  1. Motion degraded examination without evidence of acute intracranial abnormality. 2. Chronic left PCA infarct. 3. Extensive chronic small vessel ischemic changes including chronic lacunar infarcts as above. 4. Numerous chronic microhemorrhages in a pattern consistent with chronic hypertension.  02-06-17: chest x-ray: No active disease.   02-06-17: ct of head: No acute intracranial abnormality. Stable left occipital lobe infarct and severe chronic small vessel disease  02-08-17: chest x-ray: No active disease.   NO NEW EXAMS      LABS REVIEWED; PREVIOUS  10-26-16: wbc 4.6; hgb 13.5; hct 41.7; mcv 91.8; plt 166 glucose 100; bun 17.2; creat 0.72;  k+ 4.4 ;na++ 141; liver normal albumin 3.7 hgb a1c 4.5  11-05-16: glucose 85; bun 9.3; creat 0.66; k+ 4.2; na++ 138; ca 9.7 12-01-16: wbc 2.8; hgb 11.1; hct 33.6; mcv 88.2. plt 131; glucose 99; bun 17; creat 1.04; k+ 3.7; na++ 138; ca 8.9; liver normal albumin 3.1 12-03-16: wbc 2.8; hgb 12.1; hct 35.6; mcv 86.0; plt 143glucose 85; bun 7; creat 0.78; k+ 3.5; na++ 139; ca 9.2 12-23-16: wbc 5.4; hgb 12.8; hct 38.3; mcv 87.2; plt 185; glucose 105; bun 10; creat 0.75; k+ 3.3; na++ 137; ca 9.3; ast 13 alt 12; albumin 3.8 12-25-16: wbc 3.1; hgb 11.4; hct 35.6; mcv 89.7; plt 171; glucose  81; bun 14; creat 0.91; k+ 3.5; na++ 140; ca 9.0; ammonia 34 02-06-17: wbc 4.4; hgb 13.6; hct 40.5; mcv 89.8; plt 178; glucose 108; bun 10; creat 0.71; k+ 3.6; na++138; ca  9.6; liver normal albumin 4.1; urine culture: <10,000 colonies 02-08-17: wbc 4.3; hgb 11.5; hct 35.2; mcv 90.5 ;plt 145; glucose 83; bun 20; creat 0.80; k+ 3.7; na++ 140; ca 9.0; mag 1.7 02-10-17: wbc 2.8; hgb 12.0; hct 35.4; mcv 89.2; plt 148; glucose 86; bun 14; creat 0.59; k+ 3.9; na++ 138; ca 8.7  NO NEW LABS         Review of Systems  Unable to perform ROS: Other (has expressive aphasia )     Physical Exam  Constitutional: He appears well-developed and well-nourished. No distress.  Neck: No thyromegaly present.  Cardiovascular: Normal rate, regular rhythm and intact distal pulses.  Murmur heard. 1/6  Pulmonary/Chest: Effort normal and breath sounds normal. No respiratory distress.  Abdominal: Soft. Bowel sounds are normal. He exhibits no distension. There is no tenderness.  Peg tube present without signs of infection present.   Musculoskeletal: He exhibits no edema.  Right side hemiplegia Has left resting tremor   Lymphadenopathy:    He has no cervical adenopathy.  Neurological: He is alert.  Skin: Skin is warm and dry. He is not diaphoretic.  Psychiatric: He has a normal mood and affect.     ASSESSMENT/ PLAN:  TODAY:   1.  Dysphagia due to recent cerebrovascular accident: stable no signs of aspiration present. He is no longer requiring the use of his peg tube; will have I/R to remove peg tube.   2.  Depression with suicidal ideation: is stable will continue prozac 20 mg daily will further reduce his risperdal to 0.25 mg nightly   3. Seizures: is stable;  No reports of seizure activity: will continue keppra 500 mg twice daily    PREVIOUS   4. Mixed hyperlipidemia: stable will continue lipitor 20 mg daily   5. Vascular dementia without behavioral disturbance: is without change; his current weight is 168pounds; is presently not taking medications will not make changes will monitor his status.    6. 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   7. Essential hypertension: stable : b/p 132/93 will continue norvasc 10 mg daily and hydralazine 25 mg twice daily     Will check cbc; cmp; lipids hgb a1c     MD is aware of resident's narcotic use and is in agreement with current plan of care. We will attempt to wean resident as apropriate   Synthia Innocent NP [facility]  Contact [phone] Monday through Friday 8am- 5pm  After hours call [phone]

## 2017-09-01 ENCOUNTER — Non-Acute Institutional Stay (SKILLED_NURSING_FACILITY): Payer: Medicaid Other | Admitting: Adult Health

## 2017-09-01 ENCOUNTER — Encounter: Payer: Self-pay | Admitting: Adult Health

## 2017-09-01 DIAGNOSIS — I69351 Hemiplegia and hemiparesis following cerebral infarction affecting right dominant side: Secondary | ICD-10-CM | POA: Diagnosis not present

## 2017-09-01 DIAGNOSIS — E782 Mixed hyperlipidemia: Secondary | ICD-10-CM | POA: Diagnosis not present

## 2017-09-01 DIAGNOSIS — Z8673 Personal history of transient ischemic attack (TIA), and cerebral infarction without residual deficits: Secondary | ICD-10-CM

## 2017-09-01 DIAGNOSIS — I6930 Unspecified sequelae of cerebral infarction: Secondary | ICD-10-CM | POA: Diagnosis not present

## 2017-09-01 DIAGNOSIS — F0150 Vascular dementia without behavioral disturbance: Secondary | ICD-10-CM

## 2017-09-01 NOTE — Progress Notes (Signed)
Location:   [facility] Room Number: 229 B Place of Service:  SNF (31)   CODE STATUS: Full Code (Most form updated 05/26/17)  No Known Allergies  Chief Complaint  Patient presents with  . Medical Management of Chronic Issues    Cva; dementia; hyperlipidemia.     HPI:  He is a 52 year old long term resident of this facility being seen for the management of his chronic illnesses: cva with hemiparesis; vascular dementia; hyperlipidemia. He is unable to participate in the hpi or ros. There are no reports of aspiration; no uncontrolled pain; no change in appetite. There are no nursing concerns at this time.   Past Medical History:  Diagnosis Date  . Arthritis   . Contracture, left hand   . 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 Depression   . Hyperlipidemia   . Hypertension   . Spasticity   . Stroke (HCC)   . Sudden cardiac arrest (HCC) 12/01/2016    Past Surgical History:  Procedure Laterality Date  . HERNIA REPAIR    . PEG TUBE PLACEMENT  07/01/2016    Social History   Socioeconomic History  . Marital status: Widowed    Spouse name: Not on file  . Number of children: Not on file  . Years of education: Not on file  . Highest education level: Not on file  Occupational History  . Occupation: Disabled  Social Needs  . Financial resource strain: Not on file  . Food insecurity:    Worry: Not on file    Inability: Not on file  . Transportation needs:    Medical: Not on file    Non-medical: Not on file  Tobacco Use  . Smoking status: Never Smoker  . Smokeless tobacco: Never Used  Substance and Sexual Activity  . Alcohol use: No    Frequency: Never  . Drug use: No  . Sexual activity: Not on file  Lifestyle  . Physical activity:    Days per week: Not on file    Minutes per session: Not on file  . Stress: Not on file  Relationships  . Social connections:    Talks on phone: Not on file    Gets together: Not on file    Attends religious service: Not  on file    Active member of club or organization: Not on file    Attends meetings of clubs or organizations: Not on file    Relationship status: Not on file  . Intimate partner violence:    Fear of current or ex partner: Not on file    Emotionally abused: Not on file    Physically abused: Not on file    Forced sexual activity: Not on file  Other Topics Concern  . Not on file  Social History Narrative   [facility]   Left-handed.   No caffeine use.   Family History  Problem Relation Age of Onset  . Hypertension Other       VITAL SIGNS BP 130/77   Pulse 80   Temp 98.6 F (37 C)   Resp 20   Ht 6' (1.829 m)   Wt 170 lb 6.4 oz (77.3 kg)   SpO2 97%   BMI 23.11 kg/m   Outpatient Encounter Medications as of 09/01/2017  Medication Sig  . amLODipine (NORVASC) 10 MG tablet Place 10 mg into feeding tube daily.  Marland Kitchen. aspirin 325 MG tablet Place 325 mg into feeding tube daily.  .Marland Kitchen  atorvastatin (LIPITOR) 20 MG tablet Place 20 mg into feeding tube at bedtime.   . ENSURE (ENSURE) Take 120 mLs by mouth 2 (two) times daily between meals.  Marland Kitchen FLUoxetine (PROZAC) 20 MG capsule Place 20 mg into feeding tube daily.  . hydrALAZINE (APRESOLINE) 25 MG tablet Place 25 mg into feeding tube 2 (two) times daily.  Marland Kitchen levETIRAcetam (KEPPRA) 100 MG/ML solution Place 5 mLs (500 mg total) into feeding tube 2 (two) times daily.  . Nutritional Supplements (NUTRITIONAL SUPPLEMENT PO) HSG Regular Diet - HSG Mech Soft texture, regular consistency  . tizanidine (ZANAFLEX) 2 MG capsule Place 1 capsule (2 mg total) into feeding tube 2 (two) times daily.  . [DISCONTINUED] risperiDONE (RISPERDAL) 0.5 MG tablet Place 0.5 mg into feeding tube at bedtime.    No facility-administered encounter medications on file as of 09/01/2017.      SIGNIFICANT DIAGNOSTIC EXAMS  PREVIOUS:   12-01-16: ct angio of chest:  1. No evidence of acute pulmonary embolism is seen. 2. Fusiform dilatation of the ascending  thoracic aorta measuring up to 41 mm in diameter. Recommend annual imaging followup by CTA or MRA.  3. Possible small hiatal hernia.  12-02-16: 2-d echo:  - Left ventricle: The cavity size was mildly dilated. Wall motion  was normal; there were no regional wall motion abnormalities. Mildly reduced global EF 50%   Doppler parameters are consistent with abnormal left ventricular relaxation (grade 1 diastolic dysfunction). - No significant valvular abnormality - Mild dilatation of aortic root (4.1 cm) and proximal ascending aorta (3.9 cm)   12-23-16: chest x-ray: There is no pneumonia nor other acute cardiopulmonary abnormality.  12-23-16: ct of head: 1. Multifocal infarcts. Extensive periventricular decreased attenuation, suspect small vessel vascular disease, age advanced, although a degree of superimposed demyelination cannot be excluded. Small vessel disease also noted in the basilar perforator distribution of the mid pons. No acute infarct evident. No intracranial mass, hemorrhage, or extra-axial fluid collection. 2.  Foci of arterial vascular calcification, age advanced. 3.  Presumed cerumen in each external auditory canal.  12-24-16: EEG: This awake EEG is abnormal due to diffuse slowing of the waking background.   12-25-16: MRI: of brain:  1. Motion degraded examination without evidence of acute intracranial abnormality. 2. Chronic left PCA infarct. 3. Extensive chronic small vessel ischemic changes including chronic lacunar infarcts as above. 4. Numerous chronic microhemorrhages in a pattern consistent with chronic hypertension.  02-06-17: chest x-ray: No active disease.   02-06-17: ct of head: No acute intracranial abnormality. Stable left occipital lobe infarct and severe chronic small vessel disease  02-08-17: chest x-ray: No active disease.   NO NEW EXAMS      LABS REVIEWED; PREVIOUS  10-26-16: wbc 4.6; hgb 13.5; hct 41.7; mcv 91.8; plt 166 glucose 100; bun 17.2; creat 0.72;  k+ 4.4 ;na++ 141; liver normal albumin 3.7 hgb a1c 4.5  11-05-16: glucose 85; bun 9.3; creat 0.66; k+ 4.2; na++ 138; ca 9.7 12-01-16: wbc 2.8; hgb 11.1; hct 33.6; mcv 88.2. plt 131; glucose 99; bun 17; creat 1.04; k+ 3.7; na++ 138; ca 8.9; liver normal albumin 3.1 12-03-16: wbc 2.8; hgb 12.1; hct 35.6; mcv 86.0; plt 143glucose 85; bun 7; creat 0.78; k+ 3.5; na++ 139; ca 9.2 12-23-16: wbc 5.4; hgb 12.8; hct 38.3; mcv 87.2; plt 185; glucose 105; bun 10; creat 0.75; k+ 3.3; na++ 137; ca 9.3; ast 13 alt 12; albumin 3.8 12-25-16: wbc 3.1; hgb 11.4; hct 35.6; mcv 89.7; plt 171; glucose 81; bun 14;  creat 0.91; k+ 3.5; na++ 140; ca 9.0; ammonia 34 02-06-17: wbc 4.4; hgb 13.6; hct 40.5; mcv 89.8; plt 178; glucose 108; bun 10; creat 0.71; k+ 3.6; na++138; ca  9.6; liver normal albumin 4.1; urine culture: <10,000 colonies 02-08-17: wbc 4.3; hgb 11.5; hct 35.2; mcv 90.5 ;plt 145; glucose 83; bun 20; creat 0.80; k+ 3.7; na++ 140; ca 9.0; mag 1.7 02-10-17: wbc 2.8; hgb 12.0; hct 35.4; mcv 89.2; plt 148; glucose 86; bun 14; creat 0.59; k+ 3.9; na++ 138; ca 8.7 03-17-17: wbc 2.6; hgb 13.2; hct 38; plt 159; glucose 88; bun 9; creat 0.7 ;k+ 3.9; na++ 141   NO NEW LABS        Review of Systems  Unable to perform ROS: Other (expressive aphasia )    Physical Exam  Constitutional: He appears well-developed and well-nourished. No distress.  Neck: No thyromegaly present.  Cardiovascular: Normal rate, regular rhythm and intact distal pulses.  Murmur heard. 1/6  Pulmonary/Chest: Effort normal and breath sounds normal. No respiratory distress.  Abdominal: Soft. Bowel sounds are normal. He exhibits no distension. There is no tenderness.  Peg tube present without signs of infection present   Musculoskeletal: He exhibits no edema.  Right side hemiplegia Has left resting tremor    Lymphadenopathy:    He has no cervical adenopathy.  Neurological: He is alert.  Skin: Skin is warm and dry. He is not diaphoretic.      ASSESSMENT/ PLAN:  TODAY:   1. Mixed hyperlipidemia: stable will continue lipitor 20 mg daily   2. Vascular dementia without behavioral disturbance: is without change; his current weight is 170 pounds; is presently not taking medications will not make changes will monitor his status.    3. 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    PREVIOUS   4. Essential hypertension: stable : b/p 130/77 will continue norvasc 10 mg daily and hydralazine 25 mg twice daily   5. Dysphagia due to recent cerebrovascular accident: stable no signs of aspiration present. He is no longer requiring the use of his peg tube; awaiting  I/R to remove peg tube.   6.  Depression with suicidal ideation: is stable will continue prozac 20 mg daily is off risperdal   7. Seizures: is stable;  No reports of seizure activity: will continue keppra 500 mg twice daily     MD is aware of resident's narcotic use and is in agreement with current plan of care. We will attempt to wean resident as apropriate   Synthia Innocent NP [facility]  Contact [phone] Monday through Friday 8am- 5pm  After hours call [phone]

## 2017-09-02 ENCOUNTER — Encounter: Payer: Self-pay | Admitting: Adult Health

## 2017-09-02 NOTE — Progress Notes (Signed)
Location:   [facility] Room Number: 229 B Place of Service:  SNF (31)   CODE STATUS: Full Code (Most form updated 05/26/17)  No Known Allergies  Chief Complaint  Patient presents with  . Medical Management of Chronic Issues        HPI:    Past Medical History:  Diagnosis Date  . Arthritis   . Contracture, left hand   . 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 Depression   . Hyperlipidemia   . Hypertension   . Spasticity   . Stroke (HCC)   . Sudden cardiac arrest (HCC) 12/01/2016    Past Surgical History:  Procedure Laterality Date  . HERNIA REPAIR    . PEG TUBE PLACEMENT  07/01/2016    Social History   Socioeconomic History  . Marital status: Widowed    Spouse name: Not on file  . Number of children: Not on file  . Years of education: Not on file  . Highest education level: Not on file  Occupational History  . Occupation: Disabled  Social Needs  . Financial resource strain: Not on file  . Food insecurity:    Worry: Not on file    Inability: Not on file  . Transportation needs:    Medical: Not on file    Non-medical: Not on file  Tobacco Use  . Smoking status: Never Smoker  . Smokeless tobacco: Never Used  Substance and Sexual Activity  . Alcohol use: No    Frequency: Never  . Drug use: No  . Sexual activity: Not on file  Lifestyle  . Physical activity:    Days per week: Not on file    Minutes per session: Not on file  . Stress: Not on file  Relationships  . Social connections:    Talks on phone: Not on file    Gets together: Not on file    Attends religious service: Not on file    Active member of club or organization: Not on file    Attends meetings of clubs or organizations: Not on file    Relationship status: Not on file  . Intimate partner violence:    Fear of current or ex partner: Not on file    Emotionally abused: Not on file    Physically abused: Not on file    Forced sexual activity: Not on file  Other Topics Concern    . Not on file  Social History Narrative   [facility]   Left-handed.   No caffeine use.   Family History  Problem Relation Age of Onset  . Hypertension Other       VITAL SIGNS BP 130/77   Pulse 80   Temp 98.6 F (37 C)   Resp 20   Ht 6' (1.829 m)   Wt 170 lb 6.4 oz (77.3 kg)   SpO2 97%   BMI 23.11 kg/m   Outpatient Encounter Medications as of 09/02/2017  Medication Sig  . amLODipine (NORVASC) 10 MG tablet Place 10 mg into feeding tube daily.  Marland Kitchen. aspirin 325 MG tablet Place 325 mg into feeding tube daily.  Marland Kitchen. atorvastatin (LIPITOR) 20 MG tablet Place 20 mg into feeding tube at bedtime.   . ENSURE (ENSURE) Take 120 mLs by mouth 2 (two) times daily between meals.  Marland Kitchen. FLUoxetine (PROZAC) 20 MG capsule Place 20 mg into feeding tube daily.  . hydrALAZINE (APRESOLINE) 25 MG tablet Place 25 mg into feeding tube 2 (two)  times daily.  Marland Kitchen levETIRAcetam (KEPPRA) 100 MG/ML solution Place 5 mLs (500 mg total) into feeding tube 2 (two) times daily.  . Nutritional Supplements (NUTRITIONAL SUPPLEMENT PO) HSG Regular Diet - HSG Mech Soft texture, regular consistency  . tizanidine (ZANAFLEX) 2 MG capsule Place 1 capsule (2 mg total) into feeding tube 2 (two) times daily.   No facility-administered encounter medications on file as of 09/02/2017.      SIGNIFICANT DIAGNOSTIC EXAMS       ASSESSMENT/ PLAN:    MD is aware of resident's narcotic use and is in agreement with current plan of care. We will attempt to wean resident as apropriate   Synthia Innocent NP [facility]  Contact [phone] Monday through Friday 8am- 5pm  After hours call [phone]

## 2017-09-02 NOTE — Progress Notes (Signed)
This encounter was created in error - please disregard.  This encounter was created in error - please disregard.

## 2017-10-01 ENCOUNTER — Encounter: Payer: Self-pay | Admitting: Adult Health

## 2017-10-01 NOTE — Progress Notes (Signed)
Location:   [facility] Room Number: 229 B Place of Service:  SNF (31)   CODE STATUS: Full Code (Most form updated 05/26/17)  No Known Allergies  Chief Complaint  Patient presents with  . Acute Visit    Eye Infection    HPI:    Past Medical History:  Diagnosis Date  . Arthritis   . Contracture, left hand   . 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 Depression   . Hyperlipidemia   . Hypertension   . Spasticity   . Stroke (HCC)   . Sudden cardiac arrest (HCC) 12/01/2016    Past Surgical History:  Procedure Laterality Date  . HERNIA REPAIR    . PEG TUBE PLACEMENT  07/01/2016    Social History   Socioeconomic History  . Marital status: Widowed    Spouse name: Not on file  . Number of children: Not on file  . Years of education: Not on file  . Highest education level: Not on file  Occupational History  . Occupation: Disabled  Social Needs  . Financial resource strain: Not on file  . Food insecurity:    Worry: Not on file    Inability: Not on file  . Transportation needs:    Medical: Not on file    Non-medical: Not on file  Tobacco Use  . Smoking status: Never Smoker  . Smokeless tobacco: Never Used  Substance and Sexual Activity  . Alcohol use: No    Frequency: Never  . Drug use: No  . Sexual activity: Not on file  Lifestyle  . Physical activity:    Days per week: Not on file    Minutes per session: Not on file  . Stress: Not on file  Relationships  . Social connections:    Talks on phone: Not on file    Gets together: Not on file    Attends religious service: Not on file    Active member of club or organization: Not on file    Attends meetings of clubs or organizations: Not on file    Relationship status: Not on file  . Intimate partner violence:    Fear of current or ex partner: Not on file    Emotionally abused: Not on file    Physically abused: Not on file    Forced sexual activity: Not on file  Other Topics Concern  . Not on  file  Social History Narrative   Resides at [facility].   No caffeine use.   Family History  Problem Relation Age of Onset  . Hypertension Other       VITAL SIGNS BP 126/71   Pulse 64   Temp (!) 97.1 F (36.2 C)   Resp 18   Ht 6' (1.829 m)   Wt 168 lb 4.8 oz (76.3 kg)   SpO2 99%   BMI 22.83 kg/m   Outpatient Encounter Medications as of 10/01/2017  Medication Sig  . amLODipine (NORVASC) 10 MG tablet Place 10 mg into feeding tube daily.  Marland Kitchen aspirin 325 MG tablet Place 325 mg into feeding tube daily.  Marland Kitchen atorvastatin (LIPITOR) 20 MG tablet Place 20 mg into feeding tube at bedtime.   . ENSURE (ENSURE) Take 120 mLs by mouth 2 (two) times daily between meals.  Marland Kitchen FLUoxetine (PROZAC) 20 MG capsule Place 20 mg into feeding tube daily.  . hydrALAZINE (APRESOLINE) 25 MG tablet Place 25 mg into feeding tube 2 (two) times daily.  Marland Kitchen  levETIRAcetam (KEPPRA) 100 MG/ML solution Place 5 mLs (500 mg total) into feeding tube 2 (two) times daily.  . Nutritional Supplements (NUTRITIONAL SUPPLEMENT PO) HSG Regular Diet - HSG Mech Soft texture, regular consistency  . tizanidine (ZANAFLEX) 2 MG capsule Place 1 capsule (2 mg total) into feeding tube 2 (two) times daily.   No facility-administered encounter medications on file as of 10/01/2017.      SIGNIFICANT DIAGNOSTIC EXAMS  PREVIOUS:   12-01-16: ct angio of chest:  1. No evidence of acute pulmonary embolism is seen. 2. Fusiform dilatation of the ascending thoracic aorta measuring up to 41 mm in diameter. Recommend annual imaging followup by CTA or MRA.  3. Possible small hiatal hernia.  12-02-16: 2-d echo:  - Left ventricle: The cavity size was mildly dilated. Wall motion  was normal; there were no regional wall motion abnormalities. Mildly reduced global EF 50%   Doppler parameters are consistent with abnormal left ventricular relaxation (grade 1 diastolic dysfunction). - No significant valvular abnormality - Mild  dilatation of aortic root (4.1 cm) and proximal ascending aorta (3.9 cm)   12-23-16: chest x-ray: There is no pneumonia nor other acute cardiopulmonary abnormality.  12-23-16: ct of head: 1. Multifocal infarcts. Extensive periventricular decreased attenuation, suspect small vessel vascular disease, age advanced, although a degree of superimposed demyelination cannot be excluded. Small vessel disease also noted in the basilar perforator distribution of the mid pons. No acute infarct evident. No intracranial mass, hemorrhage, or extra-axial fluid collection. 2.  Foci of arterial vascular calcification, age advanced. 3.  Presumed cerumen in each external auditory canal.  12-24-16: EEG: This awake EEG is abnormal due to diffuse slowing of the waking background.   12-25-16: MRI: of brain:  1. Motion degraded examination without evidence of acute intracranial abnormality. 2. Chronic left PCA infarct. 3. Extensive chronic small vessel ischemic changes including chronic lacunar infarcts as above. 4. Numerous chronic microhemorrhages in a pattern consistent with chronic hypertension.  02-06-17: chest x-ray: No active disease.   02-06-17: ct of head: No acute intracranial abnormality. Stable left occipital lobe infarct and severe chronic small vessel disease  02-08-17: chest x-ray: No active disease.   NO NEW EXAMS      LABS REVIEWED; PREVIOUS  10-26-16: wbc 4.6; hgb 13.5; hct 41.7; mcv 91.8; plt 166 glucose 100; bun 17.2; creat 0.72; k+ 4.4 ;na++ 141; liver normal albumin 3.7 hgb a1c 4.5  11-05-16: glucose 85; bun 9.3; creat 0.66; k+ 4.2; na++ 138; ca 9.7 12-01-16: wbc 2.8; hgb 11.1; hct 33.6; mcv 88.2. plt 131; glucose 99; bun 17; creat 1.04; k+ 3.7; na++ 138; ca 8.9; liver normal albumin 3.1 12-03-16: wbc 2.8; hgb 12.1; hct 35.6; mcv 86.0; plt 143glucose 85; bun 7; creat 0.78; k+ 3.5; na++ 139; ca 9.2 12-23-16: wbc 5.4; hgb 12.8; hct 38.3; mcv 87.2; plt 185; glucose 105; bun 10; creat 0.75; k+  3.3; na++ 137; ca 9.3; ast 13 alt 12; albumin 3.8 12-25-16: wbc 3.1; hgb 11.4; hct 35.6; mcv 89.7; plt 171; glucose 81; bun 14; creat 0.91; k+ 3.5; na++ 140; ca 9.0; ammonia 34 02-06-17: wbc 4.4; hgb 13.6; hct 40.5; mcv 89.8; plt 178; glucose 108; bun 10; creat 0.71; k+ 3.6; na++138; ca  9.6; liver normal albumin 4.1; urine culture: <10,000 colonies 02-08-17: wbc 4.3; hgb 11.5; hct 35.2; mcv 90.5 ;plt 145; glucose 83; bun 20; creat 0.80; k+ 3.7; na++ 140; ca 9.0; mag 1.7 02-10-17: wbc 2.8; hgb 12.0; hct 35.4; mcv 89.2; plt 148; glucose 86;  bun 14; creat 0.59; k+ 3.9; na++ 138; ca 8.7 03-17-17: wbc 2.6; hgb 13.2; hct 38; plt 159; glucose 88; bun 9; creat 0.7 ;k+ 3.9; na++ 141   NO NEW LABS        Review of Systems  Unable to perform ROS: Other (expressive aphasia )    Physical Exam  Constitutional: He appears well-developed and well-nourished. No distress.  Neck: No thyromegaly present.  Cardiovascular: Normal rate, regular rhythm and intact distal pulses.  Murmur heard. 1/6  Pulmonary/Chest: Effort normal and breath sounds normal. No respiratory distress.  Abdominal: Soft. Bowel sounds are normal. He exhibits no distension. There is no tenderness.  Peg tube present without signs of infection present   Musculoskeletal: He exhibits no edema.  Right side hemiplegia Has left resting tremor    Lymphadenopathy:    He has no cervical adenopathy.  Neurological: He is alert.  Skin: Skin is warm and dry. He is not diaphoretic.     ASSESSMENT/ PLAN:  TODAY:   1. Mixed hyperlipidemia: stable will continue lipitor 20 mg daily   2. Vascular dementia without behavioral disturbance: is without change; his current weight is 170 pounds; is presently not taking medications will not make changes will monitor his status.    3. 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    PREVIOUS   4. Essential hypertension: stable : b/p 130/77 will continue norvasc 10 mg daily and hydralazine 25 mg twice daily   5. Dysphagia due to recent cerebrovascular accident: stable no signs of aspiration present. He is no longer requiring the use of his peg tube; awaiting  I/R to remove peg tube.   6.  Depression with suicidal ideation: is stable will continue prozac 20 mg daily is off risperdal   7. Seizures: is stable;  No reports of seizure activity: will continue keppra 500 mg twice daily    MD is aware of resident's narcotic use and is in agreement with current plan of care. We will attempt to wean resident as apropriate   Synthia Innocenteborah Green NP [facility]  Contact [phone] Monday through Friday 8am- 5pm  After hours call [phone]

## 2017-10-06 ENCOUNTER — Non-Acute Institutional Stay (SKILLED_NURSING_FACILITY): Payer: Medicaid Other | Admitting: Adult Health

## 2017-10-06 ENCOUNTER — Encounter: Payer: Self-pay | Admitting: Adult Health

## 2017-10-06 DIAGNOSIS — R569 Unspecified convulsions: Secondary | ICD-10-CM

## 2017-10-06 DIAGNOSIS — I69391 Dysphagia following cerebral infarction: Secondary | ICD-10-CM | POA: Diagnosis not present

## 2017-10-06 DIAGNOSIS — F32A Depression, unspecified: Secondary | ICD-10-CM

## 2017-10-06 DIAGNOSIS — I10 Essential (primary) hypertension: Secondary | ICD-10-CM

## 2017-10-06 DIAGNOSIS — F329 Major depressive disorder, single episode, unspecified: Secondary | ICD-10-CM

## 2017-10-06 DIAGNOSIS — R45851 Suicidal ideations: Secondary | ICD-10-CM

## 2017-10-06 NOTE — Progress Notes (Signed)
Location:   [facility] Room Number: 229 B Place of Service:  SNF (31)   CODE STATUS: Full Code (Most form updated 05/26/17)  No Known Allergies  Chief Complaint  Patient presents with  . Medical Management of Chronic Issues    Dysphagia; hypertension; seizure; depression     HPI:  He is a 52 year old long term resident of this facility being seen for the management of his chronic illnesses; dysphagia; hypertension; seizure; depression. He is unable to fully participate in the hpi or ros. There are no reports of choking; no changes in appetite; no uncontrolled pain; no signs of depression. There are no nursing concerns at this time.   Past Medical History:  Diagnosis Date  . Arthritis   . Contracture, left hand   . 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 Depression   . Hyperlipidemia   . Hypertension   . Spasticity   . Stroke (HCC)   . Sudden cardiac arrest (HCC) 12/01/2016    Past Surgical History:  Procedure Laterality Date  . HERNIA REPAIR    . PEG TUBE PLACEMENT  07/01/2016    Social History   Socioeconomic History  . Marital status: Widowed    Spouse name: Not on file  . Number of children: Not on file  . Years of education: Not on file  . Highest education level: Not on file  Occupational History  . Occupation: Disabled  Social Needs  . Financial resource strain: Not on file  . Food insecurity:    Worry: Not on file    Inability: Not on file  . Transportation needs:    Medical: Not on file    Non-medical: Not on file  Tobacco Use  . Smoking status: Never Smoker  . Smokeless tobacco: Never Used  Substance and Sexual Activity  . Alcohol use: No    Frequency: Never  . Drug use: No  . Sexual activity: Not on file  Lifestyle  . Physical activity:    Days per week: Not on file    Minutes per session: Not on file  . Stress: Not on file  Relationships  . Social connections:    Talks on phone: Not on file    Gets together: Not on file   Attends religious service: Not on file    Active member of club or organization: Not on file    Attends meetings of clubs or organizations: Not on file    Relationship status: Not on file  . Intimate partner violence:    Fear of current or ex partner: Not on file    Emotionally abused: Not on file    Physically abused: Not on file    Forced sexual activity: Not on file  Other Topics Concern  . Not on file  Social History Narrative   Resides at [employer].   No caffeine use.   Family History  Problem Relation Age of Onset  . Hypertension Other       VITAL SIGNS BP 126/71   Pulse 64   Temp (!) 97.1 F (36.2 C)   Resp 18   Ht 6' (1.829 m)   Wt 168 lb 4.8 oz (76.3 kg)   SpO2 99%   BMI 22.83 kg/m   Outpatient Encounter Medications as of 10/06/2017  Medication Sig  . amLODipine (NORVASC) 10 MG tablet Place 10 mg into feeding tube daily.  Marland Kitchen aspirin 325 MG tablet Place 325 mg into  feeding tube daily.  Marland Kitchen atorvastatin (LIPITOR) 20 MG tablet Place 20 mg into feeding tube at bedtime.   . ENSURE (ENSURE) Take 120 mLs by mouth 2 (two) times daily between meals.  Marland Kitchen FLUoxetine (PROZAC) 10 MG capsule Place 20 mg into feeding tube daily.   . hydrALAZINE (APRESOLINE) 25 MG tablet Place 25 mg into feeding tube 2 (two) times daily.  Marland Kitchen levETIRAcetam (KEPPRA) 100 MG/ML solution Place 5 mLs (500 mg total) into feeding tube 2 (two) times daily.  . Nutritional Supplements (NUTRITIONAL SUPPLEMENT PO) HSG Regular Diet - HSG Mech Soft texture, regular consistency  . tizanidine (ZANAFLEX) 2 MG capsule Place 1 capsule (2 mg total) into feeding tube 2 (two) times daily.   No facility-administered encounter medications on file as of 10/06/2017.      SIGNIFICANT DIAGNOSTIC EXAMS   PREVIOUS:   12-01-16: ct angio of chest:  1. No evidence of acute pulmonary embolism is seen. 2. Fusiform dilatation of the ascending thoracic aorta measuring up to 41 mm in diameter. Recommend  annual imaging followup by CTA or MRA.  3. Possible small hiatal hernia.  12-02-16: 2-d echo:  - Left ventricle: The cavity size was mildly dilated. Wall motion  was normal; there were no regional wall motion abnormalities. Mildly reduced global EF 50%   Doppler parameters are consistent with abnormal left ventricular relaxation (grade 1 diastolic dysfunction). - No significant valvular abnormality - Mild dilatation of aortic root (4.1 cm) and proximal ascending aorta (3.9 cm)   12-23-16: chest x-ray: There is no pneumonia nor other acute cardiopulmonary abnormality.  12-23-16: ct of head: 1. Multifocal infarcts. Extensive periventricular decreased attenuation, suspect small vessel vascular disease, age advanced, although a degree of superimposed demyelination cannot be excluded. Small vessel disease also noted in the basilar perforator distribution of the mid pons. No acute infarct evident. No intracranial mass, hemorrhage, or extra-axial fluid collection. 2.  Foci of arterial vascular calcification, age advanced. 3.  Presumed cerumen in each external auditory canal.  12-24-16: EEG: This awake EEG is abnormal due to diffuse slowing of the waking background.   12-25-16: MRI: of brain:  1. Motion degraded examination without evidence of acute intracranial abnormality. 2. Chronic left PCA infarct. 3. Extensive chronic small vessel ischemic changes including chronic lacunar infarcts as above. 4. Numerous chronic microhemorrhages in a pattern consistent with chronic hypertension.  02-06-17: chest x-ray: No active disease.   02-06-17: ct of head: No acute intracranial abnormality. Stable left occipital lobe infarct and severe chronic small vessel disease  02-08-17: chest x-ray: No active disease.   NO NEW EXAMS      LABS REVIEWED; PREVIOUS  10-26-16: wbc 4.6; hgb 13.5; hct 41.7; mcv 91.8; plt 166 glucose 100; bun 17.2; creat 0.72; k+ 4.4 ;na++ 141; liver normal albumin 3.7 hgb a1c 4.5    11-05-16: glucose 85; bun 9.3; creat 0.66; k+ 4.2; na++ 138; ca 9.7 12-01-16: wbc 2.8; hgb 11.1; hct 33.6; mcv 88.2. plt 131; glucose 99; bun 17; creat 1.04; k+ 3.7; na++ 138; ca 8.9; liver normal albumin 3.1 12-03-16: wbc 2.8; hgb 12.1; hct 35.6; mcv 86.0; plt 143glucose 85; bun 7; creat 0.78; k+ 3.5; na++ 139; ca 9.2 12-23-16: wbc 5.4; hgb 12.8; hct 38.3; mcv 87.2; plt 185; glucose 105; bun 10; creat 0.75; k+ 3.3; na++ 137; ca 9.3; ast 13 alt 12; albumin 3.8 12-25-16: wbc 3.1; hgb 11.4; hct 35.6; mcv 89.7; plt 171; glucose 81; bun 14; creat 0.91; k+ 3.5; na++ 140; ca 9.0; ammonia  34 02-06-17: wbc 4.4; hgb 13.6; hct 40.5; mcv 89.8; plt 178; glucose 108; bun 10; creat 0.71; k+ 3.6; na++138; ca  9.6; liver normal albumin 4.1; urine culture: <10,000 colonies 02-08-17: wbc 4.3; hgb 11.5; hct 35.2; mcv 90.5 ;plt 145; glucose 83; bun 20; creat 0.80; k+ 3.7; na++ 140; ca 9.0; mag 1.7 02-10-17: wbc 2.8; hgb 12.0; hct 35.4; mcv 89.2; plt 148; glucose 86; bun 14; creat 0.59; k+ 3.9; na++ 138; ca 8.7 03-17-17: wbc 2.6; hgb 13.2; hct 38; plt 159; glucose 88; bun 9; creat 0.7 ;k+ 3.9; na++ 141   NO NEW LABS        Review of Systems  Unable to perform ROS: Other (expressive aphasia )   Physical Exam  Constitutional: He is oriented to person, place, and time. He appears well-developed and well-nourished. No distress.  Neck: No thyromegaly present.  Cardiovascular: Normal rate, regular rhythm and intact distal pulses.  Murmur heard. 1/6  Pulmonary/Chest: Effort normal and breath sounds normal. No respiratory distress.  Abdominal: Soft. Bowel sounds are normal. He exhibits no distension. There is no tenderness.  Peg tube present without signs of infection present   Musculoskeletal: He exhibits no edema.  Right side hemiplegia Has left resting tremor   Lymphadenopathy:    He has no cervical adenopathy.  Neurological: He is alert and oriented to person, place, and time.  Skin: Skin is warm and dry. He is not  diaphoretic.  Psychiatric: He has a normal mood and affect.    ASSESSMENT/ PLAN:  TODAY:   1. Essential hypertension: stable : b/p 126/71 will continue norvasc 10 mg daily and hydralazine 25 mg twice daily   2. Dysphagia due to recent cerebrovascular accident: stable no signs of aspiration present. He is no longer requiring the use of his peg tube; awaiting  I/R to remove peg tube.   3.  Depression with suicidal ideation: is stable will continue prozac 20 mg daily is off risperdal   4. Seizures: is stable;  No reports of seizure activity: will continue keppra 500 mg twice daily   PREVIOUS   5. Mixed hyperlipidemia: stable will continue lipitor 20 mg daily   6. Vascular dementia without behavioral disturbance: is without change; his current weight is 168 pounds; is presently not taking medications will not make changes will monitor his status.    7. 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 We will attempt to wean resident as apropriate   Synthia Innocenteborah Luv Mish NP [facility]  Contact [phone] Monday through Friday 8am- 5pm  After hours call [phone]

## 2017-10-07 LAB — CBC AND DIFFERENTIAL
HEMATOCRIT: 43 (ref 41–53)
HEMOGLOBIN: 13.9 (ref 13.5–17.5)
Neutrophils Absolute: 2
PLATELETS: 150 (ref 150–399)
WBC: 3.3

## 2017-10-07 LAB — BASIC METABOLIC PANEL
BUN: 14 (ref 4–21)
Creatinine: 0.7 (ref 0.6–1.3)
GLUCOSE: 115
Potassium: 3.8 (ref 3.4–5.3)
SODIUM: 141 (ref 137–147)

## 2017-10-07 LAB — LIPID PANEL
Cholesterol: 116 (ref 0–200)
HDL: 65 (ref 35–70)
LDL CALC: 45
Triglycerides: 31 — AB (ref 40–160)

## 2017-10-07 LAB — HEPATIC FUNCTION PANEL
ALT: 11 (ref 10–40)
AST: 9 — AB (ref 14–40)
Alkaline Phosphatase: 57 (ref 25–125)
BILIRUBIN, TOTAL: 0.3

## 2017-10-07 LAB — PSA: PSA: 0.49

## 2017-10-10 ENCOUNTER — Encounter: Payer: Self-pay | Admitting: Internal Medicine

## 2017-10-10 NOTE — Progress Notes (Signed)
This encounter was created in error - please disregard.

## 2017-10-27 ENCOUNTER — Encounter: Payer: Self-pay | Admitting: Internal Medicine

## 2017-11-05 ENCOUNTER — Non-Acute Institutional Stay (SKILLED_NURSING_FACILITY): Payer: Medicaid Other | Admitting: Adult Health

## 2017-11-05 ENCOUNTER — Encounter: Payer: Self-pay | Admitting: Adult Health

## 2017-11-05 DIAGNOSIS — E782 Mixed hyperlipidemia: Secondary | ICD-10-CM

## 2017-11-05 DIAGNOSIS — I6930 Unspecified sequelae of cerebral infarction: Secondary | ICD-10-CM

## 2017-11-05 DIAGNOSIS — I69351 Hemiplegia and hemiparesis following cerebral infarction affecting right dominant side: Secondary | ICD-10-CM

## 2017-11-05 DIAGNOSIS — Z8673 Personal history of transient ischemic attack (TIA), and cerebral infarction without residual deficits: Secondary | ICD-10-CM

## 2017-11-05 DIAGNOSIS — F0150 Vascular dementia without behavioral disturbance: Secondary | ICD-10-CM | POA: Diagnosis not present

## 2017-11-05 NOTE — Progress Notes (Signed)
Location:   [facility] Room Number: 229 B Place of Service:  SNF (31)   CODE STATUS: Full Code (Most form updated 05/26/17)  No Known Allergies  Chief Complaint  Patient presents with  . Medical Management of Chronic Issues    Cva; dementia; hyperlipidemia     HPI:  He is a 52 year old long term resident of this facility being seen for the management of his chronic illnesses: cva; dementia; hemiparesis; hyperlipidemia. He is unable to fully participate in the hpi or ros. There are no reports of uncontrolled pain; no changes in appetite; no anxiety. There are no nursing concerns at this time.   Past Medical History:  Diagnosis Date  . Arthritis   . Contracture, left hand   . 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 Depression   . Hyperlipidemia   . Hypertension   . Spasticity   . Stroke (HCC)   . Sudden cardiac arrest (HCC) 12/01/2016    Past Surgical History:  Procedure Laterality Date  . HERNIA REPAIR    . PEG TUBE PLACEMENT  07/01/2016    Social History   Socioeconomic History  . Marital status: Widowed    Spouse name: Not on file  . Number of children: Not on file  . Years of education: Not on file  . Highest education level: Not on file  Occupational History  . Occupation: Disabled  Social Needs  . Financial resource strain: Not on file  . Food insecurity:    Worry: Not on file    Inability: Not on file  . Transportation needs:    Medical: Not on file    Non-medical: Not on file  Tobacco Use  . Smoking status: Never Smoker  . Smokeless tobacco: Never Used  Substance and Sexual Activity  . Alcohol use: No    Frequency: Never  . Drug use: No  . Sexual activity: Not on file  Lifestyle  . Physical activity:    Days per week: Not on file    Minutes per session: Not on file  . Stress: Not on file  Relationships  . Social connections:    Talks on phone: Not on file    Gets together: Not on file    Attends religious service: Not on file    Active member of club or organization: Not on file    Attends meetings of clubs or organizations: Not on file    Relationship status: Not on file  . Intimate partner violence:    Fear of current or ex partner: Not on file    Emotionally abused: Not on file    Physically abused: Not on file    Forced sexual activity: Not on file  Other Topics Concern  . Not on file  Social History Narrative   [facility]   Left-handed.   No caffeine use.   Family History  Problem Relation Age of Onset  . Hypertension Other       VITAL SIGNS BP 126/72   Pulse (!) 56   Temp (!) 97.2 F (36.2 C)   Resp 20   Ht 6' (1.829 m)   Wt 170 lb 1.6 oz (77.2 kg)   SpO2 100%   BMI 23.07 kg/m   Outpatient Encounter Medications as of 11/05/2017  Medication Sig  . amLODipine (NORVASC) 10 MG tablet Place 10 mg into feeding tube daily.  Marland Kitchen. aspirin 325 MG tablet Place 325 mg into feeding tube daily.  .Marland Kitchen  atorvastatin (LIPITOR) 20 MG tablet Place 20 mg into feeding tube at bedtime.   . ENSURE (ENSURE) Take 120 mLs by mouth 2 (two) times daily between meals.  Marland Kitchen FLUoxetine (PROZAC) 10 MG capsule Place 10 mg into feeding tube daily.   . hydrALAZINE (APRESOLINE) 25 MG tablet Place 25 mg into feeding tube 2 (two) times daily.  Marland Kitchen levETIRAcetam (KEPPRA) 100 MG/ML solution Place 5 mLs (500 mg total) into feeding tube 2 (two) times daily.  . Nutritional Supplements (NUTRITIONAL SUPPLEMENT PO) HSG Regular Diet - HSG Mech Soft texture, regular consistency  . tizanidine (ZANAFLEX) 2 MG capsule Place 1 capsule (2 mg total) into feeding tube 2 (two) times daily.   No facility-administered encounter medications on file as of 11/05/2017.      SIGNIFICANT DIAGNOSTIC EXAMS  PREVIOUS:   12-01-16: ct angio of chest:  1. No evidence of acute pulmonary embolism is seen. 2. Fusiform dilatation of the ascending thoracic aorta measuring up to 41 mm in diameter. Recommend annual imaging followup by CTA or MRA.  3.  Possible small hiatal hernia.  12-02-16: 2-d echo:  - Left ventricle: The cavity size was mildly dilated. Wall motion  was normal; there were no regional wall motion abnormalities. Mildly reduced global EF 50%   Doppler parameters are consistent with abnormal left ventricular relaxation (grade 1 diastolic dysfunction). - No significant valvular abnormality - Mild dilatation of aortic root (4.1 cm) and proximal ascending aorta (3.9 cm)   12-23-16: chest x-ray: There is no pneumonia nor other acute cardiopulmonary abnormality.  12-23-16: ct of head: 1. Multifocal infarcts. Extensive periventricular decreased attenuation, suspect small vessel vascular disease, age advanced, although a degree of superimposed demyelination cannot be excluded. Small vessel disease also noted in the basilar perforator distribution of the mid pons. No acute infarct evident. No intracranial mass, hemorrhage, or extra-axial fluid collection. 2.  Foci of arterial vascular calcification, age advanced. 3.  Presumed cerumen in each external auditory canal.  12-24-16: EEG: This awake EEG is abnormal due to diffuse slowing of the waking background.   12-25-16: MRI: of brain:  1. Motion degraded examination without evidence of acute intracranial abnormality. 2. Chronic left PCA infarct. 3. Extensive chronic small vessel ischemic changes including chronic lacunar infarcts as above. 4. Numerous chronic microhemorrhages in a pattern consistent with chronic hypertension.  02-06-17: chest x-ray: No active disease.   02-06-17: ct of head: No acute intracranial abnormality. Stable left occipital lobe infarct and severe chronic small vessel disease  02-08-17: chest x-ray: No active disease.   NO NEW EXAMS      LABS REVIEWED; PREVIOUS  10-26-16: wbc 4.6; hgb 13.5; hct 41.7; mcv 91.8; plt 166 glucose 100; bun 17.2; creat 0.72; k+ 4.4 ;na++ 141; liver normal albumin 3.7 hgb a1c 4.5  11-05-16: glucose 85; bun 9.3; creat 0.66; k+  4.2; na++ 138; ca 9.7 12-01-16: wbc 2.8; hgb 11.1; hct 33.6; mcv 88.2. plt 131; glucose 99; bun 17; creat 1.04; k+ 3.7; na++ 138; ca 8.9; liver normal albumin 3.1 12-03-16: wbc 2.8; hgb 12.1; hct 35.6; mcv 86.0; plt 143glucose 85; bun 7; creat 0.78; k+ 3.5; na++ 139; ca 9.2 12-23-16: wbc 5.4; hgb 12.8; hct 38.3; mcv 87.2; plt 185; glucose 105; bun 10; creat 0.75; k+ 3.3; na++ 137; ca 9.3; ast 13 alt 12; albumin 3.8 12-25-16: wbc 3.1; hgb 11.4; hct 35.6; mcv 89.7; plt 171; glucose 81; bun 14; creat 0.91; k+ 3.5; na++ 140; ca 9.0; ammonia 34 02-06-17: wbc 4.4; hgb 13.6; hct  40.5; mcv 89.8; plt 178; glucose 108; bun 10; creat 0.71; k+ 3.6; na++138; ca  9.6; liver normal albumin 4.1; urine culture: <10,000 colonies 02-08-17: wbc 4.3; hgb 11.5; hct 35.2; mcv 90.5 ;plt 145; glucose 83; bun 20; creat 0.80; k+ 3.7; na++ 140; ca 9.0; mag 1.7 02-10-17: wbc 2.8; hgb 12.0; hct 35.4; mcv 89.2; plt 148; glucose 86; bun 14; creat 0.59; k+ 3.9; na++ 138; ca 8.7 03-17-17: wbc 2.6; hgb 13.2; hct 38; plt 159; glucose 88; bun 9; creat 0.7 ;k+ 3.9; na++ 141   TODAY:   10-07-17: wbc 3.3; hgb 13.9; hct 42.5; mcv 90.7; plt 150; glucose 115; bun 13.8; creat 0.69; k+ 3.8; na++141 ca 9.1; liver normal albumin 4.1; chol 116; ldl 45; trig 31; hdl 65; PSA 0.49    Review of Systems  Unable to perform ROS: Dementia (expressive aphasia )   Physical Exam  Constitutional: He appears well-developed and well-nourished. No distress.  Neck: No thyromegaly present.  Cardiovascular: Normal rate, regular rhythm and intact distal pulses.  Murmur heard. 1/6  Pulmonary/Chest: Effort normal and breath sounds normal. No respiratory distress.  Abdominal: Soft. Bowel sounds are normal. He exhibits no distension. There is no tenderness.  Peg tube present without signs of infection present   Musculoskeletal: He exhibits no edema.  Right side hemiplegia Has left resting tremor    Lymphadenopathy:    He has no cervical adenopathy.  Neurological:  He is alert.  Skin: Skin is warm and dry. He is not diaphoretic.  Psychiatric: He has a normal mood and affect.     ASSESSMENT/ PLAN:  TODAY:   1. Mixed hyperlipidemia: stable LDL 45 will continue lipitor 20 mg daily   2. Vascular dementia without behavioral disturbance: is without change; his current weight is 170 pounds; is presently not taking medications will not make changes will monitor his status.    3. 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 Essential hypertension: stable : b/p 126/72 will continue norvasc 10 mg daily and hydralazine 25 mg twice daily   5. Dysphagia due to recent cerebrovascular accident: stable no signs of aspiration present. He is no longer requiring the use of his peg tube; may not be able to get PEG tube removed; he had is placed in [place] [facility]; cannot find local to remove tube.   6.  Depression with suicidal ideation: is stable will continue prozac 10 mg daily is off risperdal   7. Seizures: is stable;  No reports of seizure activity: will continue keppra 500 mg twice daily     MD is aware of resident's narcotic use and is in agreement with current plan of care. We will attempt to wean resident as apropriate   Synthia Innocent NP [facility]  Contact [phone] Monday through Friday 8am- 5pm  After hours call [phone]

## 2017-11-17 ENCOUNTER — Non-Acute Institutional Stay (SKILLED_NURSING_FACILITY): Payer: Medicaid Other | Admitting: Adult Health

## 2017-11-17 ENCOUNTER — Encounter: Payer: Self-pay | Admitting: Adult Health

## 2017-11-17 DIAGNOSIS — F0150 Vascular dementia without behavioral disturbance: Secondary | ICD-10-CM | POA: Diagnosis not present

## 2017-11-17 DIAGNOSIS — I6930 Unspecified sequelae of cerebral infarction: Secondary | ICD-10-CM

## 2017-11-17 DIAGNOSIS — I69351 Hemiplegia and hemiparesis following cerebral infarction affecting right dominant side: Secondary | ICD-10-CM

## 2017-11-17 NOTE — Progress Notes (Signed)
Location:   [facility] Room Number: 229 B Place of Service:  SNF (31)   CODE STATUS:  Full Code (Most form updated 05/26/17)  No Known Allergies  Chief Complaint  Patient presents with  . Acute Visit    Care plan meeting    HPI:  We have come together for his routine care plan meeting. He is unable to fully participate in the hpi or ros; there are no family members present. There are no reports of anxiety. He is being weaned off prozac at this time. He is more engaging with others. We continue to attempt to get his peg tube removed. Will get medical records from [place] [facility] to determine the type of peg tube placed; hoping to get the tube removed. There are no reports of uncontrolled pain. We have reviewed his medications and plan of care.   Past Medical History:  Diagnosis Date  . Arthritis   . Contracture, left hand   . 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 Depression   . Hyperlipidemia   . Hypertension   . Spasticity   . Stroke (HCC)   . Sudden cardiac arrest (HCC) 12/01/2016    Past Surgical History:  Procedure Laterality Date  . HERNIA REPAIR    . PEG TUBE PLACEMENT  07/01/2016    Social History   Socioeconomic History  . Marital status: Widowed    Spouse name: Not on file  . Number of children: Not on file  . Years of education: Not on file  . Highest education level: Not on file  Occupational History  . Occupation: Disabled  Social Needs  . Financial resource strain: Not on file  . Food insecurity:    Worry: Not on file    Inability: Not on file  . Transportation needs:    Medical: Not on file    Non-medical: Not on file  Tobacco Use  . Smoking status: Never Smoker  . Smokeless tobacco: Never Used  Substance and Sexual Activity  . Alcohol use: No    Frequency: Never  . Drug use: No  . Sexual activity: Not on file  Lifestyle  . Physical activity:    Days per week: Not on file    Minutes per session: Not on file  . Stress: Not on file    Relationships  . Social connections:    Talks on phone: Not on file    Gets together: Not on file    Attends religious service: Not on file    Active member of club or organization: Not on file    Attends meetings of clubs or organizations: Not on file    Relationship status: Not on file  . Intimate partner violence:    Fear of current or ex partner: Not on file    Emotionally abused: Not on file    Physically abused: Not on file    Forced sexual activity: Not on file  Other Topics Concern  . Not on file  Social History Narrative   Resides at [facility].   No caffeine use.   Family History  Problem Relation Age of Onset  . Hypertension Other       VITAL SIGNS BP 118/74   Pulse 68   Temp (!) 97.1 F (36.2 C)   Resp 18   Ht 6' (1.829 m)   Wt 167 lb 6.4 oz (75.9 kg)   SpO2 96%   BMI 22.70 kg/m  Outpatient Encounter Medications as of 11/17/2017  Medication Sig  . amLODipine (NORVASC) 10 MG tablet Place 10 mg into feeding tube daily.  Marland Kitchen aspirin 325 MG tablet Place 325 mg into feeding tube daily.  Marland Kitchen atorvastatin (LIPITOR) 20 MG tablet Place 20 mg into feeding tube at bedtime.   . ENSURE (ENSURE) Take 120 mLs by mouth 2 (two) times daily between meals.  . hydrALAZINE (APRESOLINE) 25 MG tablet Place 25 mg into feeding tube 2 (two) times daily.  Marland Kitchen levETIRAcetam (KEPPRA) 100 MG/ML solution Place 5 mLs (500 mg total) into feeding tube 2 (two) times daily.  . Nutritional Supplements (NUTRITIONAL SUPPLEMENT PO) HSG Regular Diet - HSG Mech Soft texture, regular consistency  . tizanidine (ZANAFLEX) 2 MG capsule Place 1 capsule (2 mg total) into feeding tube 2 (two) times daily.  . [DISCONTINUED] FLUoxetine (PROZAC) 10 MG capsule Place 10 mg into feeding tube daily.    No facility-administered encounter medications on file as of 11/17/2017.      SIGNIFICANT DIAGNOSTIC EXAMS  PREVIOUS:   12-01-16: ct angio of chest:  1. No evidence of acute pulmonary  embolism is seen. 2. Fusiform dilatation of the ascending thoracic aorta measuring up to 41 mm in diameter. Recommend annual imaging followup by CTA or MRA.  3. Possible small hiatal hernia.  12-02-16: 2-d echo:  - Left ventricle: The cavity size was mildly dilated. Wall motion  was normal; there were no regional wall motion abnormalities. Mildly reduced global EF 50%   Doppler parameters are consistent with abnormal left ventricular relaxation (grade 1 diastolic dysfunction). - No significant valvular abnormality - Mild dilatation of aortic root (4.1 cm) and proximal ascending aorta (3.9 cm)   12-23-16: chest x-ray: There is no pneumonia nor other acute cardiopulmonary abnormality.  12-23-16: ct of head: 1. Multifocal infarcts. Extensive periventricular decreased attenuation, suspect small vessel vascular disease, age advanced, although a degree of superimposed demyelination cannot be excluded. Small vessel disease also noted in the basilar perforator distribution of the mid pons. No acute infarct evident. No intracranial mass, hemorrhage, or extra-axial fluid collection. 2.  Foci of arterial vascular calcification, age advanced. 3.  Presumed cerumen in each external auditory canal.  12-24-16: EEG: This awake EEG is abnormal due to diffuse slowing of the waking background.   12-25-16: MRI: of brain:  1. Motion degraded examination without evidence of acute intracranial abnormality. 2. Chronic left PCA infarct. 3. Extensive chronic small vessel ischemic changes including chronic lacunar infarcts as above. 4. Numerous chronic microhemorrhages in a pattern consistent with chronic hypertension.  02-06-17: chest x-ray: No active disease.   02-06-17: ct of head: No acute intracranial abnormality. Stable left occipital lobe infarct and severe chronic small vessel disease  02-08-17: chest x-ray: No active disease.   NO NEW EXAMS      LABS REVIEWED; PREVIOUS  12-01-16: wbc 2.8; hgb 11.1; hct  33.6; mcv 88.2. plt 131; glucose 99; bun 17; creat 1.04; k+ 3.7; na++ 138; ca 8.9; liver normal albumin 3.1 12-03-16: wbc 2.8; hgb 12.1; hct 35.6; mcv 86.0; plt 143glucose 85; bun 7; creat 0.78; k+ 3.5; na++ 139; ca 9.2 12-23-16: wbc 5.4; hgb 12.8; hct 38.3; mcv 87.2; plt 185; glucose 105; bun 10; creat 0.75; k+ 3.3; na++ 137; ca 9.3; ast 13 alt 12; albumin 3.8 12-25-16: wbc 3.1; hgb 11.4; hct 35.6; mcv 89.7; plt 171; glucose 81; bun 14; creat 0.91; k+ 3.5; na++ 140; ca 9.0; ammonia 34 02-06-17: wbc 4.4; hgb 13.6; hct 40.5; mcv 89.8;  plt 178; glucose 108; bun 10; creat 0.71; k+ 3.6; na++138; ca  9.6; liver normal albumin 4.1; urine culture: <10,000 colonies 02-08-17: wbc 4.3; hgb 11.5; hct 35.2; mcv 90.5 ;plt 145; glucose 83; bun 20; creat 0.80; k+ 3.7; na++ 140; ca 9.0; mag 1.7 02-10-17: wbc 2.8; hgb 12.0; hct 35.4; mcv 89.2; plt 148; glucose 86; bun 14; creat 0.59; k+ 3.9; na++ 138; ca 8.7 03-17-17: wbc 2.6; hgb 13.2; hct 38; plt 159; glucose 88; bun 9; creat 0.7 ;k+ 3.9; na++ 141  10-07-17: wbc 3.3; hgb 13.9; hct 42.5; mcv 90.7; plt 150; glucose 115; bun 13.8; creat 0.69; k+ 3.8; na++141 ca 9.1; liver normal albumin 4.1; chol 116; ldl 45; trig 31; hdl 65; PSA 0.49   NO NEW LABS.    Review of Systems  Reason unable to perform ROS: expressive aphasia     Physical Exam  Constitutional: He appears well-developed and well-nourished. No distress.  Neck: No thyromegaly present.  Cardiovascular: Normal rate, regular rhythm and intact distal pulses.  Murmur heard. 1/6  Pulmonary/Chest: Effort normal and breath sounds normal. No respiratory distress.  Abdominal: Soft. Bowel sounds are normal. He exhibits no distension. There is no tenderness.  Peg tube present without signs of infection present    Musculoskeletal: He exhibits no edema.  Right side hemiplegia Has left resting tremor     Lymphadenopathy:    He has no cervical adenopathy.  Neurological: He is alert.  Skin: Skin is warm and dry. He is  not diaphoretic.  Psychiatric: He has a normal mood and affect.    ASSESSMENT/ PLAN:  TODAY:   1. Chronic cerebrovascular accident  2. Hemiparesis affecting right side as late effect of stroke  3. Vascular dementia without behavioral disturbance.   Will continue his current plan of care Will continue current medications Will continue to have his peg tube removed.     MD is aware of resident's narcotic use and is in agreement with current plan of care. We will attempt to wean resident as apropriate   Synthia Innocent NP [facility]  Contact [phone] Monday through Friday 8am- 5pm  After hours call [phone]

## 2017-11-24 ENCOUNTER — Encounter: Payer: Self-pay | Admitting: Adult Health

## 2017-12-03 ENCOUNTER — Non-Acute Institutional Stay (SKILLED_NURSING_FACILITY): Payer: Medicaid Other | Admitting: Adult Health

## 2017-12-03 ENCOUNTER — Encounter: Payer: Self-pay | Admitting: Adult Health

## 2017-12-03 DIAGNOSIS — I69391 Dysphagia following cerebral infarction: Secondary | ICD-10-CM | POA: Diagnosis not present

## 2017-12-03 DIAGNOSIS — I10 Essential (primary) hypertension: Secondary | ICD-10-CM | POA: Diagnosis not present

## 2017-12-03 DIAGNOSIS — R569 Unspecified convulsions: Secondary | ICD-10-CM | POA: Diagnosis not present

## 2017-12-03 DIAGNOSIS — R45851 Suicidal ideations: Secondary | ICD-10-CM

## 2017-12-03 DIAGNOSIS — F32A Depression, unspecified: Secondary | ICD-10-CM

## 2017-12-03 DIAGNOSIS — F329 Major depressive disorder, single episode, unspecified: Secondary | ICD-10-CM | POA: Diagnosis not present

## 2017-12-03 NOTE — Progress Notes (Signed)
Location:   [facility] Room Number: 229 B Place of Service:  SNF (31)   CODE STATUS: Full Code (Most form updated 05/26/17)  No Known Allergies  Chief Complaint  Patient presents with  . Medical Management of Chronic Issues    Seizure; depression; dysphagia; hypertension     HPI:  He is a 52 year old long term resident of this facility being seen for the management of his chronic illnesses: seizures; depression; dysphagia; hypertension. He denies any changes in appetite; no choking; no uncontrolled pain.   Past Medical History:  Diagnosis Date  . Arthritis   . Contracture, left hand   . 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 Depression   . Hyperlipidemia   . Hypertension   . Spasticity   . Stroke (HCC)   . Sudden cardiac arrest (HCC) 12/01/2016  . Vascular dementia without behavioral disturbance 04/19/2017   Due to cva     Past Surgical History:  Procedure Laterality Date  . HERNIA REPAIR    . PEG TUBE PLACEMENT  07/01/2016    Social History   Socioeconomic History  . Marital status: Widowed    Spouse name: Not on file  . Number of children: Not on file  . Years of education: Not on file  . Highest education level: Not on file  Occupational History  . Occupation: Disabled  Social Needs  . Financial resource strain: Not on file  . Food insecurity:    Worry: Not on file    Inability: Not on file  . Transportation needs:    Medical: Not on file    Non-medical: Not on file  Tobacco Use  . Smoking status: Never Smoker  . Smokeless tobacco: Never Used  Substance and Sexual Activity  . Alcohol use: No    Frequency: Never  . Drug use: No  . Sexual activity: Not on file  Lifestyle  . Physical activity:    Days per week: Not on file    Minutes per session: Not on file  . Stress: Not on file  Relationships  . Social connections:    Talks on phone: Not on file    Gets together: Not on file    Attends religious service: Not on file    Active  member of club or organization: Not on file    Attends meetings of clubs or organizations: Not on file    Relationship status: Not on file  . Intimate partner violence:    Fear of current or ex partner: Not on file    Emotionally abused: Not on file    Physically abused: Not on file    Forced sexual activity: Not on file  Other Topics Concern  . Not on file  Social History Narrative   Resides at [facility].   No caffeine use.   Family History  Problem Relation Age of Onset  . Hypertension Other       VITAL SIGNS BP 127/87   Pulse 69   Temp (!) 97.1 F (36.2 C)   Resp 18   Ht 6' (1.829 m)   Wt 167 lb 6.4 oz (75.9 kg)   SpO2 98%   BMI 22.70 kg/m   Outpatient Encounter Medications as of 12/03/2017  Medication Sig  . amLODipine (NORVASC) 10 MG tablet Place 10 mg into feeding tube daily.  Marland Kitchen aspirin 325 MG tablet Place 325 mg into feeding tube daily.  Marland Kitchen atorvastatin (LIPITOR) 20 MG  tablet Place 20 mg into feeding tube at bedtime.   . ENSURE (ENSURE) Take 120 mLs by mouth 2 (two) times daily between meals.  . hydrALAZINE (APRESOLINE) 25 MG tablet Place 25 mg into feeding tube 2 (two) times daily.  Marland Kitchen levETIRAcetam (KEPPRA) 100 MG/ML solution Place 5 mLs (500 mg total) into feeding tube 2 (two) times daily.  . Nutritional Supplements (NUTRITIONAL SUPPLEMENT PO) HSG Regular Diet - HSG Mech Soft texture, regular consistency  . tizanidine (ZANAFLEX) 2 MG capsule Place 1 capsule (2 mg total) into feeding tube 2 (two) times daily.   No facility-administered encounter medications on file as of 12/03/2017.      SIGNIFICANT DIAGNOSTIC EXAMS  PREVIOUS:   12-01-16: ct angio of chest:  1. No evidence of acute pulmonary embolism is seen. 2. Fusiform dilatation of the ascending thoracic aorta measuring up to 41 mm in diameter. Recommend annual imaging followup by CTA or MRA.  3. Possible small hiatal hernia.  12-02-16: 2-d echo:  - Left ventricle: The cavity size  was mildly dilated. Wall motion  was normal; there were no regional wall motion abnormalities. Mildly reduced global EF 50%   Doppler parameters are consistent with abnormal left ventricular relaxation (grade 1 diastolic dysfunction). - No significant valvular abnormality - Mild dilatation of aortic root (4.1 cm) and proximal ascending aorta (3.9 cm)   12-23-16: chest x-ray: There is no pneumonia nor other acute cardiopulmonary abnormality.  12-23-16: ct of head: 1. Multifocal infarcts. Extensive periventricular decreased attenuation, suspect small vessel vascular disease, age advanced, although a degree of superimposed demyelination cannot be excluded. Small vessel disease also noted in the basilar perforator distribution of the mid pons. No acute infarct evident. No intracranial mass, hemorrhage, or extra-axial fluid collection. 2.  Foci of arterial vascular calcification, age advanced. 3.  Presumed cerumen in each external auditory canal.  12-24-16: EEG: This awake EEG is abnormal due to diffuse slowing of the waking background.   12-25-16: MRI: of brain:  1. Motion degraded examination without evidence of acute intracranial abnormality. 2. Chronic left PCA infarct. 3. Extensive chronic small vessel ischemic changes including chronic lacunar infarcts as above. 4. Numerous chronic microhemorrhages in a pattern consistent with chronic hypertension.  02-06-17: chest x-ray: No active disease.   02-06-17: ct of head: No acute intracranial abnormality. Stable left occipital lobe infarct and severe chronic small vessel disease  02-08-17: chest x-ray: No active disease.   NO NEW EXAMS      LABS REVIEWED; PREVIOUS  12-01-16: wbc 2.8; hgb 11.1; hct 33.6; mcv 88.2. plt 131; glucose 99; bun 17; creat 1.04; k+ 3.7; na++ 138; ca 8.9; liver normal albumin 3.1 12-03-16: wbc 2.8; hgb 12.1; hct 35.6; mcv 86.0; plt 143glucose 85; bun 7; creat 0.78; k+ 3.5; na++ 139; ca 9.2 12-23-16: wbc 5.4; hgb 12.8;  hct 38.3; mcv 87.2; plt 185; glucose 105; bun 10; creat 0.75; k+ 3.3; na++ 137; ca 9.3; ast 13 alt 12; albumin 3.8 12-25-16: wbc 3.1; hgb 11.4; hct 35.6; mcv 89.7; plt 171; glucose 81; bun 14; creat 0.91; k+ 3.5; na++ 140; ca 9.0; ammonia 34 02-06-17: wbc 4.4; hgb 13.6; hct 40.5; mcv 89.8; plt 178; glucose 108; bun 10; creat 0.71; k+ 3.6; na++138; ca  9.6; liver normal albumin 4.1; urine culture: <10,000 colonies 02-08-17: wbc 4.3; hgb 11.5; hct 35.2; mcv 90.5 ;plt 145; glucose 83; bun 20; creat 0.80; k+ 3.7; na++ 140; ca 9.0; mag 1.7 02-10-17: wbc 2.8; hgb 12.0; hct 35.4; mcv 89.2; plt 148;  glucose 86; bun 14; creat 0.59; k+ 3.9; na++ 138; ca 8.7 03-17-17: wbc 2.6; hgb 13.2; hct 38; plt 159; glucose 88; bun 9; creat 0.7 ;k+ 3.9; na++ 141  10-07-17: wbc 3.3; hgb 13.9; hct 42.5; mcv 90.7; plt 150; glucose 115; bun 13.8; creat 0.69; k+ 3.8; na++141 ca 9.1; liver normal albumin 4.1; chol 116; ldl 45; trig 31; hdl 65; PSA 0.49   NO NEW LABS.   Review of Systems  Reason unable to perform ROS: poor historian; has some expressive aphasia   Constitutional: Negative for malaise/fatigue.  Respiratory: Negative for cough.   Cardiovascular: Negative for chest pain.  Gastrointestinal: Negative for heartburn.  Musculoskeletal: Negative for myalgias.  Skin: Negative.   Psychiatric/Behavioral: The patient is not nervous/anxious.     Physical Exam  Constitutional: He appears well-developed and well-nourished. No distress.  Neck: No thyromegaly present.  Cardiovascular: Normal rate, regular rhythm and intact distal pulses.  Murmur heard. 1/6  Pulmonary/Chest: Effort normal and breath sounds normal. No respiratory distress.  Abdominal: Soft. Bowel sounds are normal. He exhibits no distension. There is no tenderness.  Peg tube present without signs of infection present   Musculoskeletal: He exhibits no edema.  Right side hemiplegia Has left resting tremor      Lymphadenopathy:    He has no cervical  adenopathy.  Neurological: He is alert.  Skin: Skin is warm and dry. He is not diaphoretic.  Psychiatric: He has a normal mood and affect.     ASSESSMENT/ PLAN:  TODAY:   1. Essential hypertension: stable : b/p 127/87 will continue norvasc 10 mg daily and hydralazine 25 mg twice daily   2. Dysphagia due to recent cerebrovascular accident: stable no signs of aspiration present. He is no longer requiring the use of his peg tube; may not be able to get PEG tube removed; he had is placed in [place] [facility]; cannot find local provider to remove tube.   3.  Depression with suicidal ideation: is stable is presently off medications; will monitor   4. Seizures: is stable;  No reports of seizure activity: will continue keppra 500 mg twice daily   PREVIOUS   5. Mixed hyperlipidemia: stable LDL 45 will continue lipitor 20 mg daily   6. Vascular dementia without behavioral disturbance: is without change; his current weight is 167 pounds; is presently not taking medications will not make changes will monitor his status.    7. 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 We will attempt to wean resident as apropriate   Synthia Innocent NP [facility]  Contact [phone] Monday through Friday 8am- 5pm  After hours call [phone]

## 2018-01-06 ENCOUNTER — Non-Acute Institutional Stay (SKILLED_NURSING_FACILITY): Payer: Medicaid Other | Admitting: Adult Health

## 2018-01-06 ENCOUNTER — Encounter: Payer: Self-pay | Admitting: Adult Health

## 2018-01-06 DIAGNOSIS — I69351 Hemiplegia and hemiparesis following cerebral infarction affecting right dominant side: Secondary | ICD-10-CM

## 2018-01-06 DIAGNOSIS — R569 Unspecified convulsions: Secondary | ICD-10-CM

## 2018-01-06 DIAGNOSIS — I6930 Unspecified sequelae of cerebral infarction: Secondary | ICD-10-CM | POA: Diagnosis not present

## 2018-01-06 DIAGNOSIS — E782 Mixed hyperlipidemia: Secondary | ICD-10-CM

## 2018-01-06 DIAGNOSIS — F0150 Vascular dementia without behavioral disturbance: Secondary | ICD-10-CM

## 2018-01-06 NOTE — Progress Notes (Signed)
Location:   [facility] Room Number: 229 B Place of Service:  SNF (31)   CODE STATUS: Full Code  No Known Allergies  Chief Complaint  Patient presents with  . Medical Management of Chronic Issues    Chronic cerebrovascular accident (CVA); hemiparesis affecting right side as late effect of stroke; vascular dementia without behavioral disturbance; seizure; mixed hyperlipidemia.     HPI:  He is a 52 year old long term resident of this facility being seen for the management of his chronic illnesses: cva; hemiparesis; dementia; seizure; hyperlipidemia. He denies any oral pain from losing his tooth yesterday. He denies any uncontrolled pain; he has had a seizure this past month. He denies any changes in appetite no aspiration.   Past Medical History:  Diagnosis Date  . Arthritis   . Contracture, left hand   . Dementia (HCC)    secondary to cva  . Depression   . Hyperlipidemia   . Hypertension   . Spasticity   . Stroke (HCC)   . Sudden cardiac arrest (HCC) 12/01/2016  . Vascular dementia without behavioral disturbance (HCC) 04/19/2017   Due to cva     Past Surgical History:  Procedure Laterality Date  . HERNIA REPAIR    . PEG TUBE PLACEMENT  07/01/2016    Social History   Socioeconomic History  . Marital status: Widowed    Spouse name: Not on file  . Number of children: Not on file  . Years of education: Not on file  . Highest education level: Not on file  Occupational History  . Occupation: Disabled  Social Needs  . Financial resource strain: Not on file  . Food insecurity:    Worry: Not on file    Inability: Not on file  . Transportation needs:    Medical: Not on file    Non-medical: Not on file  Tobacco Use  . Smoking status: Never Smoker  . Smokeless tobacco: Never Used  Substance and Sexual Activity  . Alcohol use: No    Frequency: Never  . Drug use: No  . Sexual activity: Not on file  Lifestyle  . Physical activity:    Days per week:  Not on file    Minutes per session: Not on file  . Stress: Not on file  Relationships  . Social connections:    Talks on phone: Not on file    Gets together: Not on file    Attends religious service: Not on file    Active member of club or organization: Not on file    Attends meetings of clubs or organizations: Not on file    Relationship status: Not on file  . Intimate partner violence:    Fear of current or ex partner: Not on file    Emotionally abused: Not on file    Physically abused: Not on file    Forced sexual activity: Not on file  Other Topics Concern  . Not on file  Social History Narrative   Resides at [facility] [facility].   No caffeine use.   Family History  Problem Relation Age of Onset  . Hypertension Other       VITAL SIGNS BP 113/76   Pulse 66   Temp (!) 97.5 F (36.4 C)   Resp 18   Ht 6' (1.829 m)   Wt 170 lb 3.2 oz (77.2 kg)   SpO2 98%   BMI 23.08 kg/m   Outpatient Encounter Medications as of 01/06/2018  Medication Sig  . amLODipine (NORVASC) 10 MG tablet Place 10 mg into feeding tube daily.  Marland Kitchen aspirin 325 MG tablet Place 325 mg into feeding tube daily.  Marland Kitchen atorvastatin (LIPITOR) 20 MG tablet Place 20 mg into feeding tube at bedtime.   . ENSURE (ENSURE) Take 120 mLs by mouth 2 (two) times daily between meals.  . hydrALAZINE (APRESOLINE) 25 MG tablet Place 25 mg into feeding tube 2 (two) times daily.  Marland Kitchen levETIRAcetam (KEPPRA) 750 MG tablet Take 750 mg by mouth 2 (two) times daily.  . Nutritional Supplements (NUTRITIONAL SUPPLEMENT PO) HSG Regular Diet - HSG Mech Soft texture, regular consistency  . tizanidine (ZANAFLEX) 2 MG capsule Place 1 capsule (2 mg total) into feeding tube 2 (two) times daily.  . [DISCONTINUED] levETIRAcetam (KEPPRA) 100 MG/ML solution Place 5 mLs (500 mg total) into feeding tube 2 (two) times daily. (Patient not taking: Reported on 01/06/2018)   No facility-administered encounter medications on file as of  01/06/2018.      SIGNIFICANT DIAGNOSTIC EXAMS  PREVIOUS:   12-01-16: ct angio of chest:  1. No evidence of acute pulmonary embolism is seen. 2. Fusiform dilatation of the ascending thoracic aorta measuring up to 41 mm in diameter. Recommend annual imaging followup by CTA or MRA.  3. Possible small hiatal hernia.  12-02-16: 2-d echo:  - Left ventricle: The cavity size was mildly dilated. Wall motion  was normal; there were no regional wall motion abnormalities. Mildly reduced global EF 50%   Doppler parameters are consistent with abnormal left ventricular relaxation (grade 1 diastolic dysfunction). - No significant valvular abnormality - Mild dilatation of aortic root (4.1 cm) and proximal ascending aorta (3.9 cm)   12-23-16: chest x-ray: There is no pneumonia nor other acute cardiopulmonary abnormality.  12-23-16: ct of head: 1. Multifocal infarcts. Extensive periventricular decreased attenuation, suspect small vessel vascular disease, age advanced, although a degree of superimposed demyelination cannot be excluded. Small vessel disease also noted in the basilar perforator distribution of the mid pons. No acute infarct evident. No intracranial mass, hemorrhage, or extra-axial fluid collection. 2.  Foci of arterial vascular calcification, age advanced. 3.  Presumed cerumen in each external auditory canal.  12-24-16: EEG: This awake EEG is abnormal due to diffuse slowing of the waking background.   12-25-16: MRI: of brain:  1. Motion degraded examination without evidence of acute intracranial abnormality. 2. Chronic left PCA infarct. 3. Extensive chronic small vessel ischemic changes including chronic lacunar infarcts as above. 4. Numerous chronic microhemorrhages in a pattern consistent with chronic hypertension.  02-06-17: chest x-ray: No active disease.   02-06-17: ct of head: No acute intracranial abnormality. Stable left occipital lobe infarct and severe chronic small vessel  disease  02-08-17: chest x-ray: No active disease.   NO NEW EXAMS      LABS REVIEWED; PREVIOUS  12-23-16: wbc 5.4; hgb 12.8; hct 38.3; mcv 87.2; plt 185; glucose 105; bun 10; creat 0.75; k+ 3.3; na++ 137; ca 9.3; ast 13 alt 12; albumin 3.8 12-25-16: wbc 3.1; hgb 11.4; hct 35.6; mcv 89.7; plt 171; glucose 81; bun 14; creat 0.91; k+ 3.5; na++ 140; ca 9.0; ammonia 34 02-06-17: wbc 4.4; hgb 13.6; hct 40.5; mcv 89.8; plt 178; glucose 108; bun 10; creat 0.71; k+ 3.6; na++138; ca  9.6; liver normal albumin 4.1; urine culture: <10,000 colonies 02-08-17: wbc 4.3; hgb 11.5; hct 35.2; mcv 90.5 ;plt 145; glucose 83; bun 20; creat 0.80; k+ 3.7; na++ 140; ca 9.0; mag 1.7 02-10-17: wbc  2.8; hgb 12.0; hct 35.4; mcv 89.2; plt 148; glucose 86; bun 14; creat 0.59; k+ 3.9; na++ 138; ca 8.7 03-17-17: wbc 2.6; hgb 13.2; hct 38; plt 159; glucose 88; bun 9; creat 0.7 ;k+ 3.9; na++ 141  10-07-17: wbc 3.3; hgb 13.9; hct 42.5; mcv 90.7; plt 150; glucose 115; bun 13.8; creat 0.69; k+ 3.8; na++141 ca 9.1; liver normal albumin 4.1; chol 116; ldl 45; trig 31; hdl 65; PSA 0.49   NO NEW LABS.     Review of Systems  Constitutional: Negative for malaise/fatigue.  Respiratory: Negative for cough.   Cardiovascular: Negative for chest pain and leg swelling.  Gastrointestinal: Negative for abdominal pain and constipation.  Musculoskeletal: Negative for back pain and joint pain.  Skin: Negative.   Neurological: Negative for dizziness.  Psychiatric/Behavioral: The patient is not nervous/anxious.     Physical Exam  Constitutional: He appears well-developed and well-nourished. No distress.  HENT:  Tooth fell out yesterday; poor dental health   Neck: No thyromegaly present.  Cardiovascular: Normal rate, regular rhythm and intact distal pulses.  Murmur heard. 1/6  Pulmonary/Chest: Effort normal and breath sounds normal. No respiratory distress.  Abdominal: Soft. Bowel sounds are normal. He exhibits no distension. There is no  tenderness.  Peg tube present without signs of infection present.   Musculoskeletal: Normal range of motion. He exhibits no edema.  Right side hemiplegia Has left resting tremor       Lymphadenopathy:    He has no cervical adenopathy.  Neurological: He is alert.  Skin: Skin is warm and dry. He is not diaphoretic.  Psychiatric: He has a normal mood and affect.      ASSESSMENT/ PLAN:  TODAY:   1. Mixed hyperlipidemia: stable LDL 45 will continue lipitor 20 mg daily   2. Vascular dementia without behavioral disturbance: is without change; his current weight is 167 pounds; is presently not taking medications will not make changes will monitor his status.    3. 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 Seizures: he did have a seizure this past month; his keppra to 750 mg twice daily will not make changes  PREVIOUS   5. Essential hypertension: stable : b/p 113/76 will continue norvasc 10 mg daily and hydralazine 25 mg twice daily   6. Dysphagia due to recent cerebrovascular accident: stable no signs of aspiration present. He is no longer requiring the use of his peg tube; may not be able to get PEG tube removed; he had is placed in [place] [facility]; the staff continues to work on getting his peg tube removed   7.  Depression with suicidal ideation: is stable is presently off medications; will monitor     MD is aware of resident's narcotic use and is in agreement with current plan of care. We will attempt to wean resident as apropriate   Synthia Innocent NP [facility]  Contact [phone] Monday through Friday 8am- 5pm  After hours call [phone]

## 2019-02-17 ENCOUNTER — Other Ambulatory Visit (HOSPITAL_COMMUNITY): Payer: Self-pay | Admitting: Adult Health

## 2019-02-17 ENCOUNTER — Other Ambulatory Visit (HOSPITAL_COMMUNITY): Payer: Self-pay | Admitting: Internal Medicine

## 2019-02-17 DIAGNOSIS — Z431 Encounter for attention to gastrostomy: Secondary | ICD-10-CM

## 2019-02-22 ENCOUNTER — Other Ambulatory Visit: Payer: Self-pay | Admitting: Physician Assistant

## 2019-02-23 ENCOUNTER — Inpatient Hospital Stay (HOSPITAL_COMMUNITY): Admission: RE | Admit: 2019-02-23 | Payer: Medicaid Other | Source: Ambulatory Visit

## 2019-02-28 ENCOUNTER — Other Ambulatory Visit: Payer: Self-pay

## 2019-02-28 ENCOUNTER — Ambulatory Visit (HOSPITAL_COMMUNITY)
Admission: RE | Admit: 2019-02-28 | Discharge: 2019-02-28 | Disposition: A | Discharge status: Home / Self Care | Payer: Medicaid Other | Source: Ambulatory Visit | Attending: Internal Medicine | Admitting: Internal Medicine

## 2019-02-28 DIAGNOSIS — Z431 Encounter for attention to gastrostomy: Secondary | ICD-10-CM | POA: Diagnosis present

## 2019-02-28 HISTORY — PX: IR GASTROSTOMY TUBE REMOVAL: IMG5492

## 2019-02-28 MED ORDER — LIDOCAINE VISCOUS HCL 2 % MT SOLN
OROMUCOSAL | Status: AC
  Filled 2019-02-28: qty 15

## 2019-02-28 MED ORDER — SILVER NITRATE-POT NITRATE 75-25 % EX MISC
CUTANEOUS | Status: AC
  Filled 2019-02-28: qty 10

## 2019-02-28 NOTE — Procedures (Signed)
Per order of Dr. Elijio Miles patient's gastrostomy tube was removed in its entirety without immediate complications.  Inner retention button was noted to be partially in the subcu tissue.  Silver nitrate was used to treat surrounding granulation tissue.  Gauze dressing applied over site afterwards and site care discussed with patient. EBL < 1 cc

## 2019-03-02 IMAGING — CT CT HEAD W/O CM
3 of 4 series · 15 of 47 positions shown, 18 images · non-contrast
Comparison: None.

CLINICAL DATA: 51-year-old presenting with acute mental status
changes, becoming unresponsive at the [HOSPITAL] and unable to
answer questions thereafter personal history of stroke.

EXAM:
CT HEAD WITHOUT CONTRAST
TECHNIQUE: Contiguous axial images were obtained from the base of the skull
through the vertex without intravenous contrast.

[Series 4: head 2.0 h70h · axial · 0.47mm/px · z∈[-133,+15]mm · 9 of 94 slices shown, 12 images]
[im 10/94  brain]
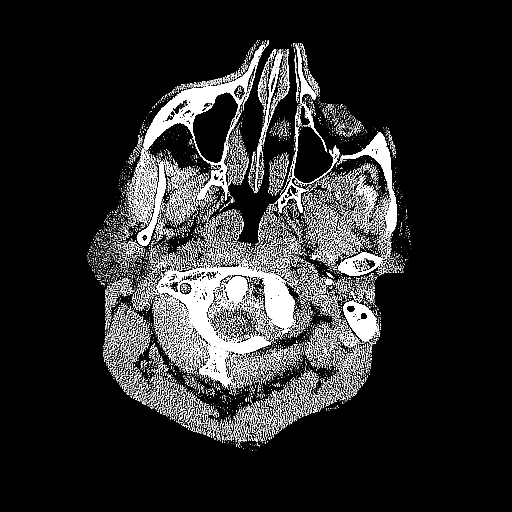
[im 10/94  bone]
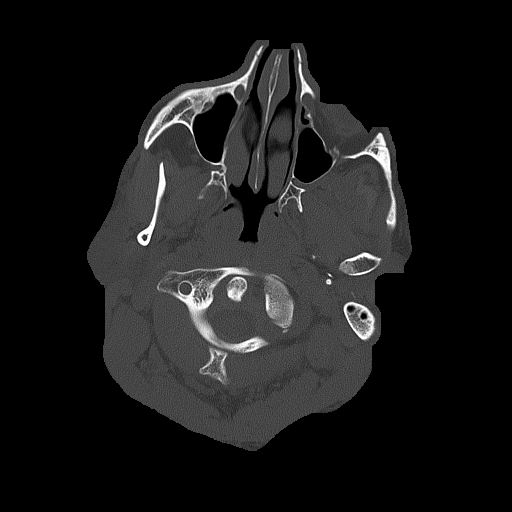
[im 19/94  brain]
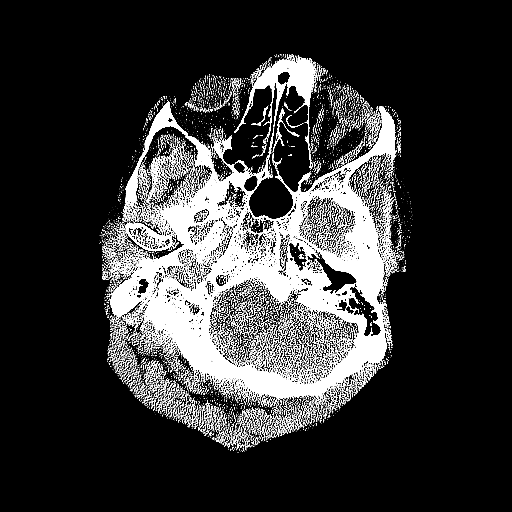
[im 28/94  brain]
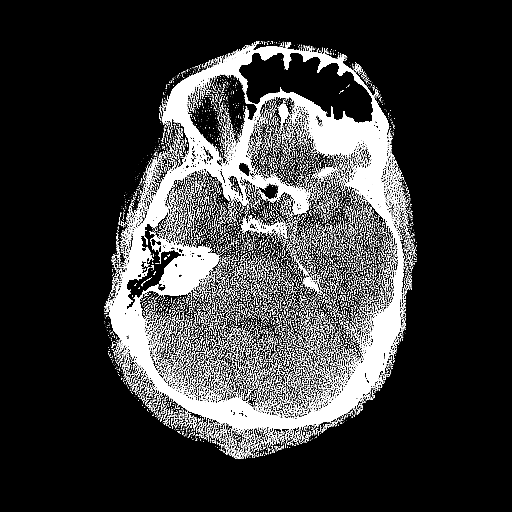
[im 38/94  brain]
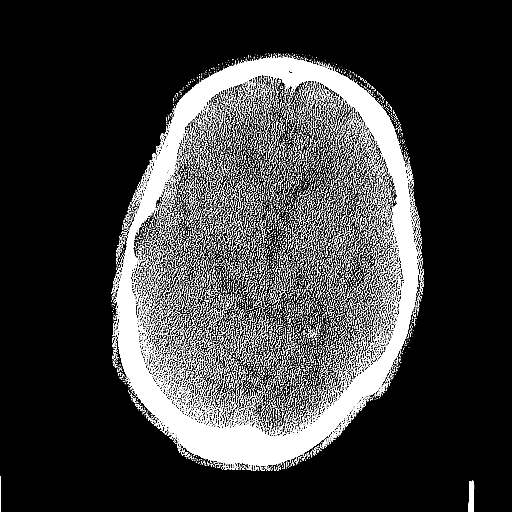
[im 47/94  brain]
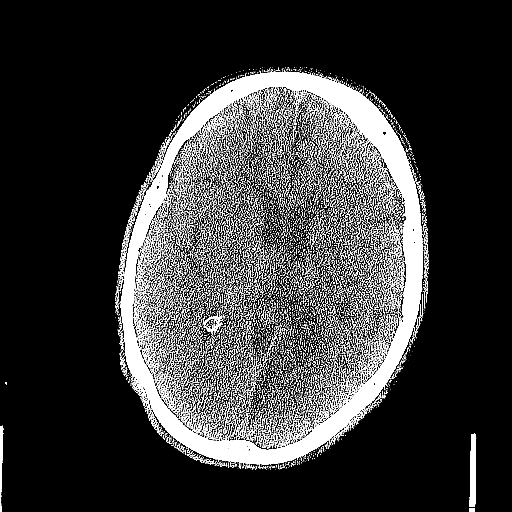
[im 47/94  bone]
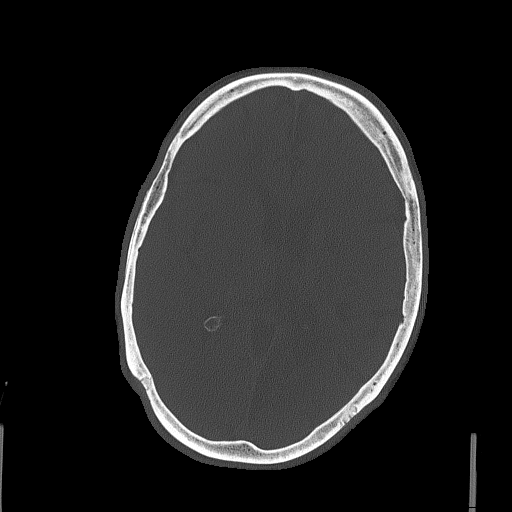
[im 56/94  brain]
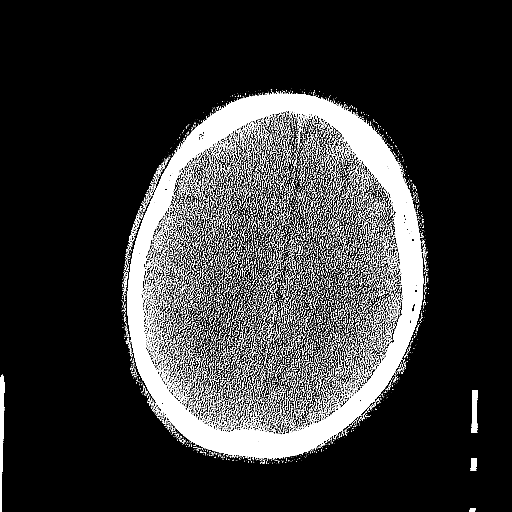
[im 66/94  brain]
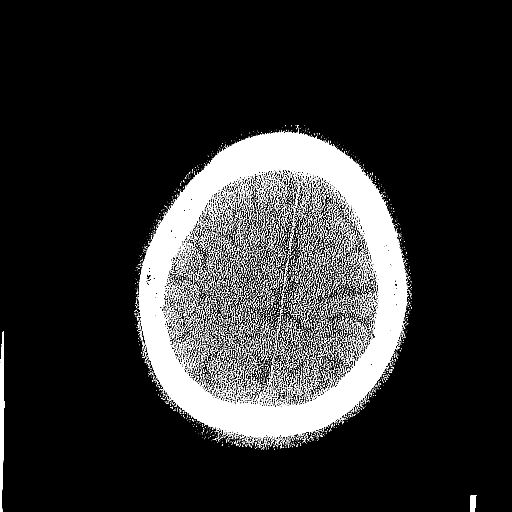
[im 75/94  brain]
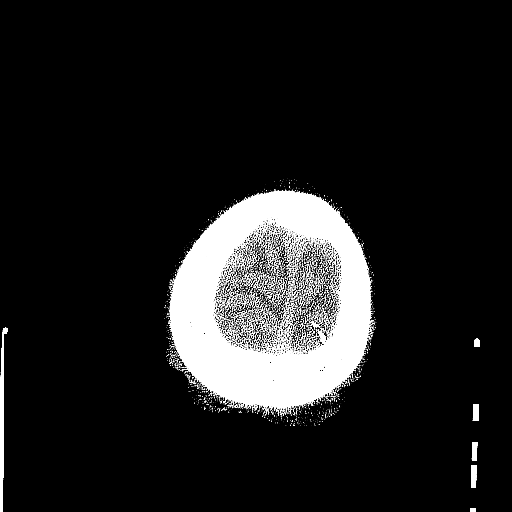
[im 84/94  brain]
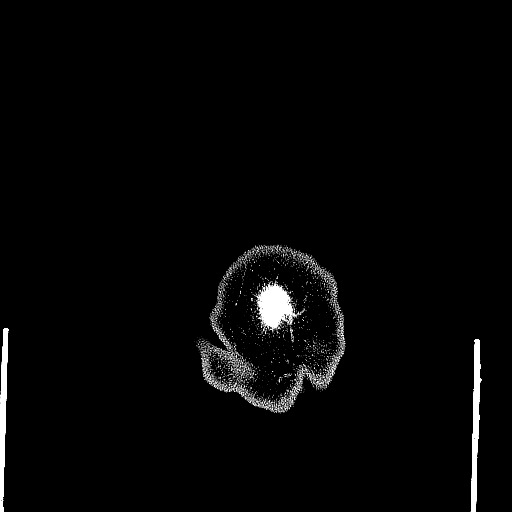
[im 84/94  bone]
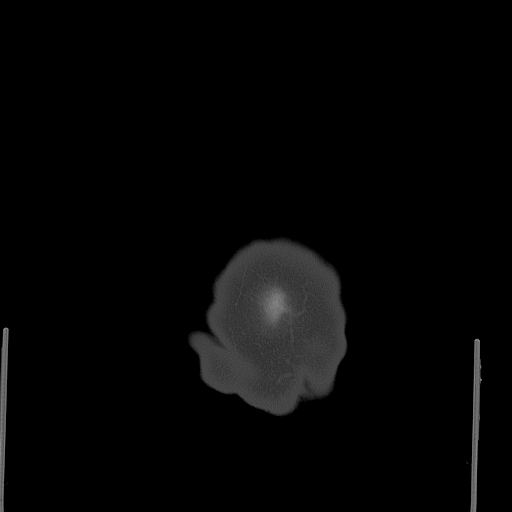

[Series 5: head 3.0 mpr cor · coronal · 0.35mm/px · 3 of 75 slices shown]
[im 25/75  brain]
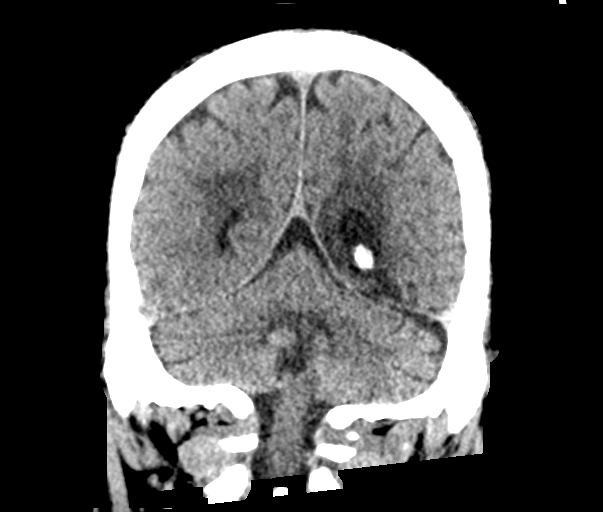
[im 33/75  brain]
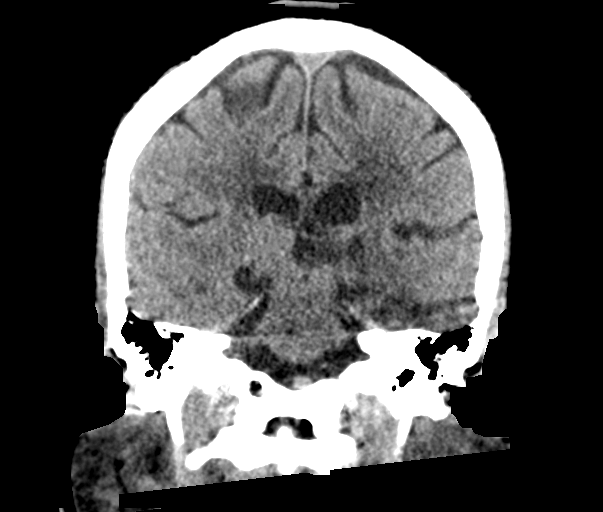
[im 42/75  brain]
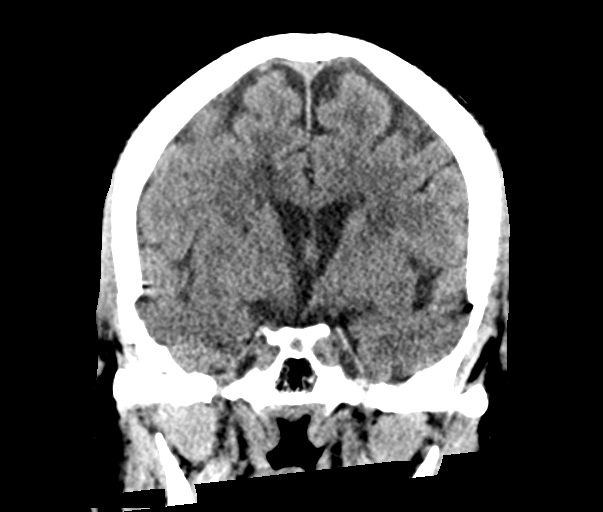

[Series 6: head 3.0 mpr sag · sagittal · 0.36mm/px · 3 of 67 slices shown]
[im 23/67  brain]
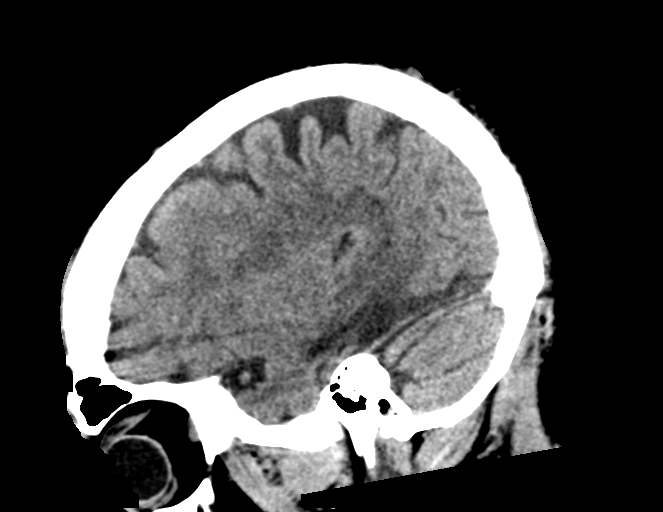
[im 34/67  brain]
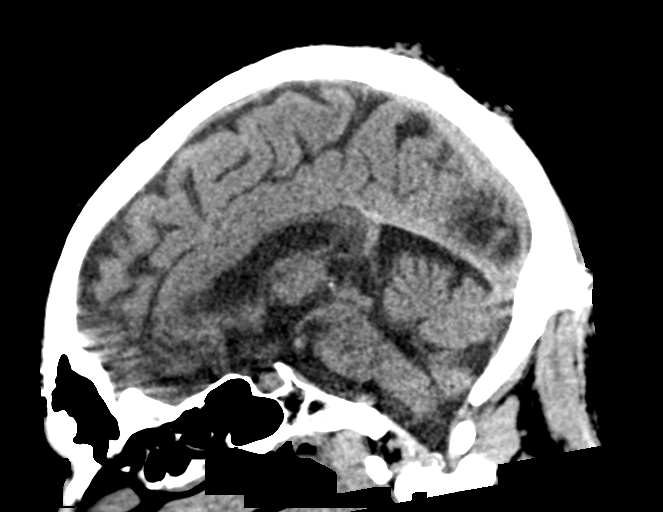
[im 45/67  brain]
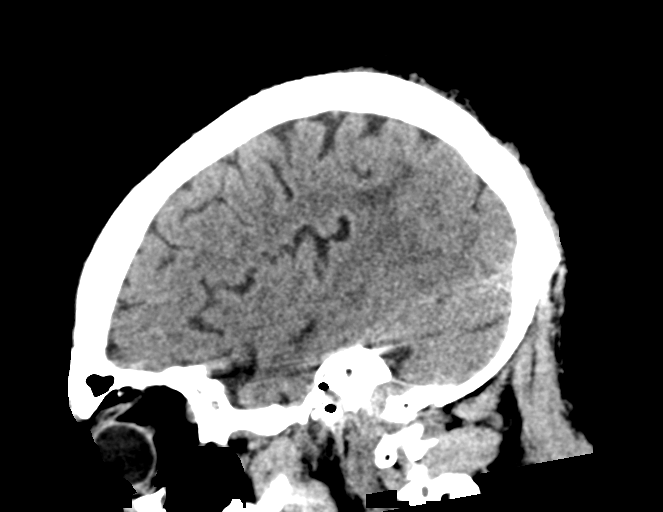

[15 of 47 positions shown; findings below may reference images not displayed]

FINDINGS: Motion blurred several of the images during the examination but the
study appears diagnostic.

Brain: Head tilt in the gantry accounts for apparent asymmetry.
Encephalomalacia involving the left occipital lobe and the left
posterior temporal lobe. Low attenuation involving pons. Ventricular
system normal in size and appearance for age. Severe changes of
small vessel disease of the white matter diffusely No mass lesion.
No midline shift. No acute hemorrhage or hematoma. No extra-axial
fluid collections. No evidence of acute infarction.

Vascular: Mild bilateral carotid siphon and left vertebral artery
atherosclerosis. Atretic right vertebral artery. No hyperdense
vessel.

Skull: No skull fracture or other focal osseous abnormality
involving the skull.

Sinuses/Orbits: Visualized paranasal sinuses, bilateral mastoid air
cells and bilateral middle ear cavities well-aerated. Visualized
orbits and globes are normal.

Other: None.
IMPRESSION: 1. No acute intracranial abnormality.
2. Remote cortical stroke involving the left occipital lobe and left
superior temporal lobe (left posterior cerebral artery
distribution).
3. Remote stroke involving the pons.
4. Severe chronic microvascular ischemic changes involving the white
matter.

## 2022-06-05 ENCOUNTER — Emergency Department (HOSPITAL_COMMUNITY): Payer: Medicaid Other

## 2022-06-05 ENCOUNTER — Emergency Department (HOSPITAL_COMMUNITY)
Admission: EM | Admit: 2022-06-05 | Discharge: 2022-06-06 | Disposition: A | Discharge status: Skilled Nursing Facility | Payer: Medicaid Other | Attending: Emergency Medicine | Admitting: Emergency Medicine

## 2022-06-05 DIAGNOSIS — W19XXXA Unspecified fall, initial encounter: Secondary | ICD-10-CM

## 2022-06-05 DIAGNOSIS — F0390 Unspecified dementia without behavioral disturbance: Secondary | ICD-10-CM | POA: Insufficient documentation

## 2022-06-05 DIAGNOSIS — I10 Essential (primary) hypertension: Secondary | ICD-10-CM | POA: Diagnosis not present

## 2022-06-05 DIAGNOSIS — R519 Headache, unspecified: Secondary | ICD-10-CM | POA: Diagnosis present

## 2022-06-05 DIAGNOSIS — W06XXXA Fall from bed, initial encounter: Secondary | ICD-10-CM | POA: Insufficient documentation

## 2022-06-05 DIAGNOSIS — Z7982 Long term (current) use of aspirin: Secondary | ICD-10-CM | POA: Diagnosis not present

## 2022-06-05 DIAGNOSIS — Z8673 Personal history of transient ischemic attack (TIA), and cerebral infarction without residual deficits: Secondary | ICD-10-CM | POA: Diagnosis not present

## 2022-06-05 DIAGNOSIS — S0990XA Unspecified injury of head, initial encounter: Secondary | ICD-10-CM

## 2022-06-05 NOTE — ED Notes (Signed)
Pt is awake, a&ox4, pwd. Pt states that he fell out of bed. States he did hit head but no LOC. Pt denies any pain. Pt has been placed in a gown, attached to monitor/vitals

## 2022-06-05 NOTE — ED Triage Notes (Signed)
PT BIB [facility] from [facility] d/t a mechanical fall. Pt stated his bed does not have rails which the facility has been ignoring his request on getting rails. Today, pt fell out of the bed, hit the head on the ground. Pt is not on blood thinner. Pt did not lose consciousness. A&O X4. Hx of stroke.

## 2022-06-05 NOTE — ED Notes (Signed)
[place] provided to the pt 

## 2022-06-05 NOTE — ED Provider Notes (Signed)
Emergency Department Provider Note   I have reviewed the triage vital signs and the nursing notes.   HISTORY  Chief Complaint Fall   HPI LATREL LUONGO is a 57 y.o. male with past history of stroke and vascular dementia presents to the emergency department from his nursing facility after fall from bed.  He tells me he is in a different bed from what he is used to and does not have bed rails.  He rolled out of bed falling to the ground striking his head.  No loss of consciousness.  He takes a 325 mg aspirin daily.  No neck or back pain.  Denies pain in the chest, abdomen, pelvis.  Past Medical History:  Diagnosis Date   Arthritis    Contracture, left hand    Dementia ([place])    secondary to cva   Depression    Hyperlipidemia    Hypertension    Spasticity    Stroke [facility])    Sudden cardiac arrest ([place]) 12/01/2016   Vascular dementia without behavioral disturbance ([place]) 04/19/2017   Due to cva     Review of Systems  Constitutional: No fever/chills Cardiovascular: Denies chest pain. Respiratory: Denies shortness of breath. Gastrointestinal: No abdominal pain.  Neurological: Positive HA.    ____________________________________________   PHYSICAL EXAM:  VITAL SIGNS: ED Triage Vitals  Enc Vitals Group     BP 06/05/22 1646 (!) 126/92     Pulse Rate 06/05/22 1646 70     Resp 06/05/22 1646 16     Temp 06/05/22 1646 98 F (36.7 C)     Temp Source 06/05/22 1646 Oral     SpO2 06/05/22 1646 99 %   Constitutional: Alert and oriented. Well appearing and in no acute distress. Eyes: Conjunctivae are normal. Head: Faint contusion to the frontal scalp. No laceration.  Nose: No congestion/rhinnorhea. Mouth/Throat: Mucous membranes are moist.   Neck: No stridor.  No cervical spine tenderness to palpation. Cardiovascular: Normal rate, regular rhythm. Good peripheral circulation. Grossly normal heart sounds.   Respiratory: Normal respiratory effort.  No retractions. Lungs  CTAB. Gastrointestinal: Soft and nontender. No distention.  Musculoskeletal: No lower extremity tenderness nor edema. No gross deformities of extremities. Neurologic:  Normal speech and language. Right arm/leg weakness consistent with prior CVA.  Skin:  Skin is warm, dry and intact. No rash noted.  ____________________________________________  RADIOLOGY  CT Head Wo Contrast  Result Date: 06/05/2022 CLINICAL DATA:  Fall, trauma EXAM: CT HEAD WITHOUT CONTRAST CT CERVICAL SPINE WITHOUT CONTRAST TECHNIQUE: Multidetector CT imaging of the head and cervical spine was performed following the standard protocol without intravenous contrast. Multiplanar CT image reconstructions of the cervical spine were also generated. RADIATION DOSE REDUCTION: This exam was performed according to the departmental dose-optimization program which includes automated exposure control, adjustment of the mA and/or kV according to patient size and/or use of iterative reconstruction technique. COMPARISON:  CT brain 12/01/2016 FINDINGS: CT HEAD FINDINGS Brain: No acute territorial infarction, hemorrhage or intracranial mass. Large chronic left PCA infarct. Mild atrophy. Extensive white matter hypodensity. Multiple chronic appearing lacunar infarcts in the white matter, pons, basal ganglia and left thalamus. Stable ventricle size. Vascular: No hyperdense vessel.  Carotid vascular calcification Skull: Normal. Negative for fracture or focal lesion. Sinuses/Orbits: No acute finding. Other: None CT CERVICAL SPINE FINDINGS Alignment: Straightening of the cervical spine. No subluxation. Facet alignment is within normal limits. Skull base and vertebrae: No acute fracture. No primary bone lesion or focal pathologic process. Soft tissues  and spinal canal: No prevertebral fluid or swelling. No visible canal hematoma. Disc levels: Multilevel degenerative osteophytes C3 through C7 with relatively patent disc spaces. Upper chest: Negative. Other: None  IMPRESSION: No CT evidence for acute intracranial abnormality. Atrophy and chronic small vessel ischemic changes of the white matter. Large chronic left PCA infarct. Straightening of the cervical spine with degenerative changes. No acute osseous abnormality. Electronically Signed   By: Donavan Foil M.D.   On: 06/05/2022 19:37   CT Cervical Spine Wo Contrast  Result Date: 06/05/2022 CLINICAL DATA:  Fall, trauma EXAM: CT HEAD WITHOUT CONTRAST CT CERVICAL SPINE WITHOUT CONTRAST TECHNIQUE: Multidetector CT imaging of the head and cervical spine was performed following the standard protocol without intravenous contrast. Multiplanar CT image reconstructions of the cervical spine were also generated. RADIATION DOSE REDUCTION: This exam was performed according to the departmental dose-optimization program which includes automated exposure control, adjustment of the mA and/or kV according to patient size and/or use of iterative reconstruction technique. COMPARISON:  CT brain 12/01/2016 FINDINGS: CT HEAD FINDINGS Brain: No acute territorial infarction, hemorrhage or intracranial mass. Large chronic left PCA infarct. Mild atrophy. Extensive white matter hypodensity. Multiple chronic appearing lacunar infarcts in the white matter, pons, basal ganglia and left thalamus. Stable ventricle size. Vascular: No hyperdense vessel.  Carotid vascular calcification Skull: Normal. Negative for fracture or focal lesion. Sinuses/Orbits: No acute finding. Other: None CT CERVICAL SPINE FINDINGS Alignment: Straightening of the cervical spine. No subluxation. Facet alignment is within normal limits. Skull base and vertebrae: No acute fracture. No primary bone lesion or focal pathologic process. Soft tissues and spinal canal: No prevertebral fluid or swelling. No visible canal hematoma. Disc levels: Multilevel degenerative osteophytes C3 through C7 with relatively patent disc spaces. Upper chest: Negative. Other: None IMPRESSION: No CT  evidence for acute intracranial abnormality. Atrophy and chronic small vessel ischemic changes of the white matter. Large chronic left PCA infarct. Straightening of the cervical spine with degenerative changes. No acute osseous abnormality. Electronically Signed   By: Donavan Foil M.D.   On: 06/05/2022 19:37    ____________________________________________   PROCEDURES  Procedure(s) performed:   Procedures  None  ____________________________________________   INITIAL IMPRESSION / ASSESSMENT AND PLAN / ED COURSE  Pertinent labs & imaging results that were available during my care of the patient were reviewed by me and considered in my medical decision making (see chart for details).   This patient is Presenting for Evaluation of head injury, which does require a range of treatment options, and is a complaint that involves a high risk of morbidity and mortality.  The Differential Diagnoses includes subdural hematoma, epidural hematoma, acute concussion, traumatic subarachnoid hemorrhage, cerebral contusions, etc.  Radiologic Tests Ordered, included CT head and c spine. I independently interpreted the images and agree with radiology interpretation.   Medical Decision Making: Summary:  Patient arrives from his nursing facility for evaluation of head injury after fall from bed. Fall appears to have been mechanical. No plan for syncope work up.   Reevaluation with update and discussion with patient. CT reassuring. Stable for discharge.   Considered admission but not acute findings on CT. Patient stable for d/c.   Patient's presentation is most consistent with acute presentation with potential threat to life or bodily function.   Disposition: discharge  ____________________________________________  FINAL CLINICAL IMPRESSION(S) / ED DIAGNOSES  Final diagnoses:  Fall, initial encounter  Injury of head, initial encounter    Note:  This document was prepared using Dragon  voice  recognition software and may include unintentional dictation errors.  Nanda Quinton, MD, [facility] Emergency Medicine    Shrihaan Porzio, [facility], MD 06/06/22 [phone]

## 2022-06-05 NOTE — Discharge Instructions (Signed)
Please use a bed with railings on the side to prevent falls. Return with any new or suddenly worsening symptoms.

## 2022-06-06 NOTE — ED Notes (Signed)
Attempted to call report x2; no answer or returned call.
